# Patient Record
Sex: Male | Born: 1950 | Race: White | Hispanic: No | Marital: Married | Smoking: Never smoker
Health system: Southern US, Community
[De-identification: ages and names within clinical notes are randomized; demographics above are authoritative.]

## PROBLEM LIST (undated history)

## (undated) DIAGNOSIS — K635 Polyp of colon: Secondary | ICD-10-CM

## (undated) DIAGNOSIS — I10 Essential (primary) hypertension: Secondary | ICD-10-CM

## (undated) DIAGNOSIS — I219 Acute myocardial infarction, unspecified: Secondary | ICD-10-CM

## (undated) DIAGNOSIS — K219 Gastro-esophageal reflux disease without esophagitis: Secondary | ICD-10-CM

## (undated) HISTORY — PX: CORONARY ANGIOPLASTY WITH STENT PLACEMENT: SHX49

## (undated) HISTORY — DX: Essential (primary) hypertension: I10

## (undated) HISTORY — DX: Acute myocardial infarction, unspecified: I21.9

## (undated) HISTORY — DX: Gastro-esophageal reflux disease without esophagitis: K21.9

## (undated) HISTORY — DX: Polyp of colon: K63.5

---

## 2007-01-14 ENCOUNTER — Ambulatory Visit: Payer: Self-pay | Admitting: Unknown Physician Specialty

## 2008-07-28 ENCOUNTER — Emergency Department: Payer: Self-pay | Admitting: Internal Medicine

## 2010-11-21 DIAGNOSIS — Z860100 Personal history of colon polyps, unspecified: Secondary | ICD-10-CM | POA: Insufficient documentation

## 2010-11-21 DIAGNOSIS — K639 Disease of intestine, unspecified: Secondary | ICD-10-CM | POA: Insufficient documentation

## 2011-02-07 DIAGNOSIS — Z9889 Other specified postprocedural states: Secondary | ICD-10-CM | POA: Insufficient documentation

## 2011-02-07 HISTORY — PX: COLONOSCOPY: SHX174

## 2011-09-02 LAB — COMPREHENSIVE METABOLIC PANEL
Alkaline Phosphatase: 67 U/L (ref 50–136)
Anion Gap: 10 (ref 7–16)
BUN: 26 mg/dL — ABNORMAL HIGH (ref 7–18)
Bilirubin,Total: 0.4 mg/dL (ref 0.2–1.0)
Chloride: 106 mmol/L (ref 98–107)
EGFR (African American): >60
EGFR (Non-African Amer.): 57 — ABNORMAL LOW
Glucose: 145 mg/dL — ABNORMAL HIGH (ref 65–99)
SGOT(AST): 39 U/L — ABNORMAL HIGH (ref 15–37)
SGPT (ALT): 46 U/L
Total Protein: 7 g/dL (ref 6.4–8.2)

## 2011-09-02 LAB — CBC
HCT: 46.8 % (ref 40.0–52.0)
MCH: 32.1 pg (ref 26.0–34.0)
MCHC: 34.6 g/dL (ref 32.0–36.0)
RBC: 5.04 10*6/uL (ref 4.40–5.90)
RDW: 13.3 % (ref 11.5–14.5)

## 2011-09-02 LAB — TROPONIN I: Troponin-I: <0.02 ng/mL

## 2011-09-03 ENCOUNTER — Inpatient Hospital Stay: Payer: Self-pay | Admitting: Internal Medicine

## 2011-09-03 LAB — BASIC METABOLIC PANEL
BUN: 20 mg/dL — ABNORMAL HIGH (ref 7–18)
Chloride: 107 mmol/L (ref 98–107)
Creatinine: 1.16 mg/dL (ref 0.60–1.30)
EGFR (Non-African Amer.): >60
Potassium: 4.2 mmol/L (ref 3.5–5.1)
Sodium: 145 mmol/L (ref 136–145)

## 2011-09-03 LAB — LIPID PANEL
HDL Cholesterol: 47 mg/dL (ref 40–60)
Ldl Cholesterol, Calc: 98 mg/dL (ref 0–100)
VLDL Cholesterol, Calc: 19 mg/dL (ref 5–40)

## 2011-09-03 LAB — CBC WITH DIFFERENTIAL/PLATELET
Basophil #: 0 10*3/uL (ref 0.0–0.1)
HCT: 43.6 % (ref 40.0–52.0)
Lymphocyte #: 1.3 10*3/uL (ref 1.0–3.6)
MCHC: 34.7 g/dL (ref 32.0–36.0)
MCV: 92 fL (ref 80–100)
Monocyte #: 0.8 x10 3/mm (ref 0.2–1.0)
Monocyte %: 8.3 %
Neutrophil #: 7.3 10*3/uL — ABNORMAL HIGH (ref 1.4–6.5)
Platelet: 193 10*3/uL (ref 150–440)
RDW: 13.3 % (ref 11.5–14.5)
WBC: 9.5 10*3/uL (ref 3.8–10.6)

## 2011-09-03 LAB — CK TOTAL AND CKMB (NOT AT ARMC)
CK, Total: 216 U/L (ref 35–232)
CK-MB: 7.3 ng/mL — ABNORMAL HIGH (ref 0.5–3.6)
CK-MB: 8.3 ng/mL — ABNORMAL HIGH (ref 0.5–3.6)

## 2011-09-03 LAB — TROPONIN I: Troponin-I: 1.3 ng/mL — ABNORMAL HIGH

## 2012-11-11 ENCOUNTER — Other Ambulatory Visit: Payer: Self-pay | Admitting: *Deleted

## 2012-11-11 NOTE — Telephone Encounter (Signed)
PRIOR AUTH IS NEEDED FOR PRESCRIPTION VOLTAREN GEL

## 2012-12-13 NOTE — Telephone Encounter (Signed)
VOLTAREN GEL AUTHORIZED

## 2013-03-05 DIAGNOSIS — I219 Acute myocardial infarction, unspecified: Secondary | ICD-10-CM

## 2013-03-05 HISTORY — DX: Acute myocardial infarction, unspecified: I21.9

## 2013-03-07 DIAGNOSIS — K219 Gastro-esophageal reflux disease without esophagitis: Secondary | ICD-10-CM | POA: Insufficient documentation

## 2013-03-07 DIAGNOSIS — I2510 Atherosclerotic heart disease of native coronary artery without angina pectoris: Secondary | ICD-10-CM | POA: Insufficient documentation

## 2013-03-07 DIAGNOSIS — I213 ST elevation (STEMI) myocardial infarction of unspecified site: Secondary | ICD-10-CM | POA: Insufficient documentation

## 2013-03-07 DIAGNOSIS — E119 Type 2 diabetes mellitus without complications: Secondary | ICD-10-CM | POA: Insufficient documentation

## 2013-03-14 DIAGNOSIS — I255 Ischemic cardiomyopathy: Secondary | ICD-10-CM | POA: Insufficient documentation

## 2013-03-14 DIAGNOSIS — E7800 Pure hypercholesterolemia, unspecified: Secondary | ICD-10-CM | POA: Insufficient documentation

## 2013-03-18 ENCOUNTER — Encounter: Payer: Self-pay | Admitting: General Practice

## 2013-04-06 ENCOUNTER — Encounter: Payer: Self-pay | Admitting: General Practice

## 2013-04-15 ENCOUNTER — Ambulatory Visit: Payer: Self-pay | Admitting: General Practice

## 2013-05-07 ENCOUNTER — Encounter: Payer: Self-pay | Admitting: General Practice

## 2013-05-14 ENCOUNTER — Ambulatory Visit: Payer: Self-pay | Admitting: General Practice

## 2013-05-15 DIAGNOSIS — I10 Essential (primary) hypertension: Secondary | ICD-10-CM | POA: Insufficient documentation

## 2013-05-21 ENCOUNTER — Ambulatory Visit: Payer: Self-pay | Admitting: General Practice

## 2013-05-21 ENCOUNTER — Encounter: Payer: Self-pay | Admitting: Podiatry

## 2013-05-21 ENCOUNTER — Ambulatory Visit (INDEPENDENT_AMBULATORY_CARE_PROVIDER_SITE_OTHER): Payer: BC Managed Care – PPO | Admitting: Podiatry

## 2013-05-21 ENCOUNTER — Ambulatory Visit: Payer: Self-pay | Admitting: Podiatry

## 2013-05-21 VITALS — BP 116/79 | HR 67 | Resp 16 | Ht 75.0 in | Wt 208.0 lb

## 2013-05-21 DIAGNOSIS — M722 Plantar fascial fibromatosis: Secondary | ICD-10-CM

## 2013-05-21 NOTE — Progress Notes (Signed)
   Presents today for followup of his plantar fasciitis. He states he has recently had an MI and is currently in cardiac rehabilitation after coronary artery stent placement. He states with the aggressive exercise regimen his feet it started to hurt again.  Objective: Pulses are palpable bilateral. Minimal tenderness on pain heels bilateral.  Assessment: Plantar fasciitis bilateral.  Plan: Scan for  Biotics.

## 2013-06-04 ENCOUNTER — Encounter: Payer: Self-pay | Admitting: *Deleted

## 2013-06-04 NOTE — Progress Notes (Signed)
SENT PT POST CARD LETTING HIM KNOW ORTHOTICS ARE HERE. 

## 2013-06-06 ENCOUNTER — Ambulatory Visit: Payer: Self-pay | Admitting: General Practice

## 2013-06-09 ENCOUNTER — Encounter: Payer: Self-pay | Admitting: Podiatry

## 2013-06-10 ENCOUNTER — Other Ambulatory Visit: Payer: Self-pay | Admitting: *Deleted

## 2013-06-10 MED ORDER — DICLOFENAC SODIUM 1 % TD GEL: 4.0000 g | Freq: Four times a day (QID) | TRANSDERMAL | Status: DC

## 2013-06-10 NOTE — Telephone Encounter (Signed)
PT CAME INTO OFFICE REQUESTING VOLTAREN GEL 1%.

## 2013-07-16 ENCOUNTER — Ambulatory Visit (INDEPENDENT_AMBULATORY_CARE_PROVIDER_SITE_OTHER): Payer: BC Managed Care – PPO | Admitting: Podiatry

## 2013-07-16 VITALS — BP 132/105 | HR 108 | Resp 16

## 2013-07-16 DIAGNOSIS — M722 Plantar fascial fibromatosis: Secondary | ICD-10-CM

## 2013-07-16 NOTE — Progress Notes (Signed)
He presents today for followup of his orthotics. He states they don't fit in his shoes.  Objective: Evaluation of the orthotics demonstrate that they appear to be too wide and with an excessively high arch.  Assessment: Painful orthotics.  Plan: Have a new pair orthotics built for him.

## 2013-07-25 ENCOUNTER — Encounter: Payer: Self-pay | Admitting: *Deleted

## 2013-07-25 NOTE — Progress Notes (Signed)
Sent pt post card letting him know orthotics are here. 

## 2013-07-30 ENCOUNTER — Encounter: Payer: Self-pay | Admitting: Podiatry

## 2013-12-10 DIAGNOSIS — M722 Plantar fascial fibromatosis: Secondary | ICD-10-CM

## 2013-12-15 ENCOUNTER — Ambulatory Visit (INDEPENDENT_AMBULATORY_CARE_PROVIDER_SITE_OTHER): Payer: BC Managed Care – PPO | Admitting: Podiatry

## 2013-12-15 VITALS — BP 121/82 | HR 78 | Resp 16

## 2013-12-15 DIAGNOSIS — M722 Plantar fascial fibromatosis: Secondary | ICD-10-CM

## 2013-12-15 MED ORDER — DICLOFENAC SODIUM 1 % TD GEL: 4.0000 g | Freq: Four times a day (QID) | TRANSDERMAL | Status: DC

## 2013-12-15 NOTE — Progress Notes (Signed)
He presents today stating that his right heel is hurting worse than his left. He states this is more than likely because of his physical therapy and cardiac rehabilitation.  Objective: Vital signs are stable he is alert and oriented 3 pulses are strongly palpable bilateral. No calf pain. He has pain on palpation medial calcaneal tubercle radiating into the medial longitudinal arch of the right foot less degree on the left foot.  Assessment: Plantar fasciitis right foot.  Plan: Discussed etiology pathology conservative versus surgical therapies. Injected the right heel today with Kenalog and local anesthetic. Dispensed to plantar fascial braces. And provided him with a plantar fascial strapping right foot. He will be back to pick up his refurbished orthotics in the next few days.

## 2014-03-05 ENCOUNTER — Encounter: Payer: Self-pay | Admitting: *Deleted

## 2014-03-19 ENCOUNTER — Ambulatory Visit (INDEPENDENT_AMBULATORY_CARE_PROVIDER_SITE_OTHER): Payer: BLUE CROSS/BLUE SHIELD | Admitting: General Surgery

## 2014-03-19 ENCOUNTER — Encounter: Payer: Self-pay | Admitting: General Surgery

## 2014-03-19 VITALS — BP 120/80 | HR 76 | Resp 12 | Ht 75.0 in | Wt 201.0 lb

## 2014-03-19 DIAGNOSIS — K4090 Unilateral inguinal hernia, without obstruction or gangrene, not specified as recurrent: Secondary | ICD-10-CM

## 2014-03-19 NOTE — Patient Instructions (Addendum)

## 2014-03-19 NOTE — Progress Notes (Addendum)
Patient ID: Gabriel Butler, male   DOB: 08/22/1950, 64 y.o.   MRN: [id number]  Chief Complaint  Patient presents with  . Other    right inguinal hernia    HPI Gabriel Butler is a 64 y.o. male here today for a evaluation of a right inguinal hernia. Patient states the noticed it three weeks ago. No pain with activity.The area pops in and out.   The patient has had no recurrent coronary symptoms since stent placement on 03/05/2013 at [facility]. HPI  Past Medical History  Diagnosis Date  . Hypertension   . GERD (gastroesophageal reflux disease)   . Heart attack 03/05/2013    Coronary stent placement.    Past Surgical History  Procedure Laterality Date  . Coronary angioplasty with stent placement    . Colonoscopy  2013    Family History  Problem Relation Age of Onset  . Lung cancer Father   . Skin cancer Brother     Social History History  Substance Use Topics  . Smoking status: Never Smoker   . Smokeless tobacco: Never Used  . Alcohol Use: 0.0 oz/week    0 Standard drinks or equivalent per week    No Known Allergies  Current Outpatient Prescriptions  Medication Sig Dispense Refill  . ALPRAZolam (XANAX) 0.25 MG tablet     . atorvastatin (LIPITOR) 40 MG tablet     . azelastine (OPTIVAR) 0.05 % ophthalmic solution     . BRILINTA 90 MG TABS tablet     . CRESTOR 40 MG tablet     . diclofenac sodium (VOLTAREN) 1 % GEL Apply 4 g topically 4 (four) times daily. 1 Tube 11  . fexofenadine (ALLEGRA) 180 MG tablet Take by mouth.    Marland Kitchen lisinopril (PRINIVIL,ZESTRIL) 10 MG tablet     . metoprolol succinate (TOPROL-XL) 50 MG 24 hr tablet Take 1 tablet by mouth 2 (two) times daily.  0  . montelukast (SINGULAIR) 10 MG tablet     . pantoprazole (PROTONIX) 40 MG tablet   12  . sertraline (ZOLOFT) 50 MG tablet Take 50 mg by mouth daily.    Marland Kitchen spironolactone (ALDACTONE) 25 MG tablet Take 25 mg by mouth daily.    . traZODone (DESYREL) 50 MG tablet Take 50 mg by mouth 2 (two) times  daily.     No current facility-administered medications for this visit.    Review of Systems Review of Systems  Constitutional: Negative.   Respiratory: Negative.   Cardiovascular: Negative.     Blood pressure 120/80, pulse 76, resp. rate 12, height 6\' 3"  (1.905 m), weight 201 lb (91.173 kg).  Physical Exam Physical Exam  Constitutional: He is oriented to person, place, and time. He appears well-developed and well-nourished.  Eyes: Conjunctivae are normal. No scleral icterus.  Neck: Neck supple.  Cardiovascular: Normal rate, regular rhythm and normal heart sounds.   Pulmonary/Chest: Effort normal and breath sounds normal.  Abdominal: Soft. Normal appearance and bowel sounds are normal. There is no hepatomegaly. There is no tenderness. A hernia is present. Hernia confirmed positive in the right inguinal area. Hernia confirmed negative in the left inguinal area.  Lymphadenopathy:    He has no cervical adenopathy.  Neurological: He is alert and oriented to person, place, and time.  Skin: Skin is warm and dry.    Data Reviewed PCP notes from Jabier Mutton, M.D.  Laboratory studies dated 03/13/2014 showed a normal creatinine 1.0 with an estimated GFR of 79, mildly elevated blood  sugar 127, normal electrolytes and liver function studies. TSH of 2.2, hemoglobin 15.9 with an MCV of 93, white blood cell count 5200 with normal differential.  Assessment    Symptomatic right inguinal hernia.    Plan    Discussed right inguinal hernia repair. Indication for mesh repair reviewed. The patient will be instructed to discontinue Brilinta one week prior to the procedure.     Patient's surgery has been scheduled for 03-26-14 at [facility].    Earline Mayotte 03/20/2014, 9:19 AM

## 2014-03-20 ENCOUNTER — Other Ambulatory Visit: Payer: Self-pay | Admitting: General Surgery

## 2014-03-20 ENCOUNTER — Encounter: Payer: Self-pay | Admitting: General Surgery

## 2014-03-20 DIAGNOSIS — K4090 Unilateral inguinal hernia, without obstruction or gangrene, not specified as recurrent: Secondary | ICD-10-CM

## 2014-03-23 ENCOUNTER — Ambulatory Visit: Payer: Self-pay | Admitting: General Surgery

## 2014-03-26 ENCOUNTER — Ambulatory Visit: Payer: Self-pay | Admitting: General Surgery

## 2014-03-26 DIAGNOSIS — K4090 Unilateral inguinal hernia, without obstruction or gangrene, not specified as recurrent: Secondary | ICD-10-CM

## 2014-03-26 HISTORY — PX: HERNIA REPAIR: SHX51

## 2014-03-30 ENCOUNTER — Encounter: Payer: Self-pay | Admitting: General Surgery

## 2014-04-02 ENCOUNTER — Encounter: Payer: Self-pay | Admitting: General Surgery

## 2014-04-02 ENCOUNTER — Ambulatory Visit (INDEPENDENT_AMBULATORY_CARE_PROVIDER_SITE_OTHER): Payer: Self-pay | Admitting: General Surgery

## 2014-04-02 VITALS — BP 122/68 | HR 72 | Resp 12 | Ht 75.0 in | Wt 198.0 lb

## 2014-04-02 DIAGNOSIS — K4090 Unilateral inguinal hernia, without obstruction or gangrene, not specified as recurrent: Secondary | ICD-10-CM

## 2014-04-02 NOTE — Patient Instructions (Addendum)
Patient to return as needed..  The patient is aware to use a heating pad as needed for comfort. Proper lifting techniques reviewed.

## 2014-04-02 NOTE — Progress Notes (Signed)
Patient ID: Gabriel Butler, male   DOB: 10/26/1950, 64 y.o.   MRN: [id number]  Chief Complaint  Patient presents with  . Routine Post Op    right inguinal hernia     HPI Gabriel Butler is a 64 y.o. male here today for his post op right inguinal hernia repair done on 03/26/14. Patient state he is doing well.  HPI  Past Medical History  Diagnosis Date  . Hypertension   . GERD (gastroesophageal reflux disease)   . Heart attack 03/05/2013    Coronary stent placement.    Past Surgical History  Procedure Laterality Date  . Coronary angioplasty with stent placement    . Colonoscopy  2013  . Hernia repair Right 03/26/14    Direct and indirect hernia noted, large ultra Pro mesh utilized.    Family History  Problem Relation Age of Onset  . Lung cancer Father   . Skin cancer Brother     Social History History  Substance Use Topics  . Smoking status: Never Smoker   . Smokeless tobacco: Never Used  . Alcohol Use: 0.0 oz/week    0 Standard drinks or equivalent per week    No Known Allergies  Current Outpatient Prescriptions  Medication Sig Dispense Refill  . ALPRAZolam (XANAX) 0.25 MG tablet     . atorvastatin (LIPITOR) 40 MG tablet     . azelastine (OPTIVAR) 0.05 % ophthalmic solution     . BRILINTA 90 MG TABS tablet     . CRESTOR 40 MG tablet     . diclofenac sodium (VOLTAREN) 1 % GEL Apply 4 g topically 4 (four) times daily. 1 Tube 11  . fexofenadine (ALLEGRA) 180 MG tablet Take by mouth.    Marland Kitchen lisinopril (PRINIVIL,ZESTRIL) 10 MG tablet     . metoprolol succinate (TOPROL-XL) 50 MG 24 hr tablet Take 1 tablet by mouth 2 (two) times daily.  0  . montelukast (SINGULAIR) 10 MG tablet     . pantoprazole (PROTONIX) 40 MG tablet   12  . sertraline (ZOLOFT) 50 MG tablet Take 50 mg by mouth daily.    Marland Kitchen spironolactone (ALDACTONE) 25 MG tablet Take 25 mg by mouth daily.    . traZODone (DESYREL) 50 MG tablet Take 50 mg by mouth 2 (two) times daily.     No current  facility-administered medications for this visit.    Review of Systems Review of Systems  Constitutional: Negative.   Respiratory: Negative.   Cardiovascular: Negative.     Blood pressure 122/68, pulse 72, resp. rate 12, height 6\' 3"  (1.905 m), weight 198 lb (89.812 kg).  Physical Exam Physical Exam  Constitutional: He is oriented to person, place, and time. He appears well-developed and well-nourished.  Abdominal:    Incision looks clean and healing well.   Neurological: He is alert and oriented to person, place, and time.  Skin: Skin is warm and dry.      Assessment    Doing well status post umbilical hernia repair.    Plan   The patient is aware to use a heating pad as needed for comfort.Proper lifting techniques reviewed. Patient to return as needed.      PCP:  Leigh Aurora 04/03/2014, 4:54 PM

## 2014-04-03 ENCOUNTER — Encounter: Payer: Self-pay | Admitting: General Surgery

## 2014-05-26 NOTE — Consult Note (Signed)
Brief Consult Note: Diagnosis: [place] nqmi.   Recommend to proceed with surgery or procedure.   Recommend further assessment or treatment.   Orders entered.   Discussed with Attending MD.   Comments: Case discussed with Prime Doc . We will procede with cardiac cath Monday Continue ROMI Continue tele lovenox subcutaneous Hydrate IV fluids.  Electronic Signatures: Dorothyann Peng D (MD)  (Signed 28-Jul-13 09:30)  Authored: Brief Consult Note   Last Updated: 28-Jul-13 09:30 by Dorothyann Peng D (MD)

## 2014-05-26 NOTE — Discharge Summary (Signed)
PATIENT NAME:  Gabriel Butler, Gabriel Butler MR#:  [id number] DATE OF BIRTH:  Dec 25, 1950  DATE OF ADMISSION:  09/03/2011 DATE OF DISCHARGE:  09/04/2011  DISCHARGE DIAGNOSES:  1. NSTEMI. 2. Coronary artery disease.  3. Hypertension.  4. Mild renal insufficiency.  5. Dizziness.   CONSULT: Dr. Juliann Pares of Cardiology    PROCEDURE: Cardiac catheterization which showed mild coronary artery disease with no stent placed.   IMAGING STUDIES DONE: Chest x-ray showed no acute abnormalities.   ADMITTING HISTORY AND PHYSICAL: Please see detailed history and physical dictated on 09/02/2011. In brief, this is a 64 year old male patient with history of hypertension, hyperglycemia, and plantar fasciitis who presented to the Emergency Room complaining of dizziness and blurred vision. The patient subsequently developed chest pain with radiation to the left shoulder and was admitted.   HOSPITAL COURSE: The patient had been admitted on telemetry floor. He had elevated troponin of up to 1.2 after which Cardiology was consulted for possible cardiac cath. The patient had a cardiac cath which showed no significant stenosis. It did show some mild coronary artery disease with no fixable occlusion and no stents were placed. The patient is being discharged home on aspirin, statin, beta-blocker, ACE inhibitor, and long-acting nitrate after discussing with Dr. Juliann Pares.   On the day of discharge the patient's blood pressure is in the normal range. He does not have any further chest pain. Cardiac examination is normal and he is being discharged home in a stable condition.    DISCHARGE MEDICATIONS:  1. Aspirin 81 mg oral once a day.  2. Simvastatin 20 mg oral once a day.  3. Imdur 30 mg oral once a day.  4. Lisinopril 5 mg oral once a day.  5. Metoprolol tartrate 25 mg oral 2 times a day.   DISCHARGE INSTRUCTIONS:  1. Follow-up with primary care physician in a week.  2. Follow-up with Dr. Juliann Pares in 1 to 2 weeks.  3. Take  medications as prescribed.  4. The patient will be on a cardiac diet with activity as tolerated.   This plan was discussed with the patient who verbalized understanding and is okay with the plan.   TIME SPENT: Time spent today on this discharge dictation along with coordinating care and counseling of the patient was 35 minutes.   ____________________________ Molinda Bailiff Alazne Quant, MD srs:drc D: 09/04/2011 16:52:33 ET T: 09/05/2011 14:34:14 ET JOB#: [id number]  cc: Wardell Heath R. Eriyonna Matsushita, MD, <Dictator> Quita Skye. Dorothey Baseman, MD Orie Fisherman MD ELECTRONICALLY SIGNED 09/06/2011 13:57

## 2014-05-26 NOTE — H&P (Signed)
PATIENT NAME:  Gabriel Butler, Gabriel Butler MR#:  [id number] DATE OF BIRTH:  February 07, 1951  DATE OF ADMISSION:  09/02/2011  REFERRING PHYSICIAN: Dr. Darnelle Catalan   FAMILY PHYSICIAN: Dr. Jabier Mutton    REASON FOR ADMISSION: Chest pain with near syncope.   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 He subsequently developed chest pain radiating to the left shoulder. His pain was described as pressure and was rated a 3 out of 10. His symptoms lasted approximately 35 to 40 minutes and resolved spontaneously. He denies any previous cardiac history. At this time the patient is asymptomatic except for some lightheadedness. In the Emergency Room, the patient's troponin went from less than 0.02 to 0.05. His EKG is normal. He is now admitted for further evaluation.   PAST MEDICAL HISTORY:  1. Benign hypertension.  2. Hyperglycemia.  3. Plantar fasciitis.   MEDICATIONS:  1. Lisinopril 5 mg p.o. daily.  2. Mobic 7.5 mg p.o. daily.  3. Aspirin 81 mg p.o. daily.   ALLERGIES: No known drug allergies.   SOCIAL HISTORY: Negative for alcohol or tobacco abuse.   FAMILY HISTORY: Negative for coronary artery disease. Positive for hypertension. Negative for colon or prostate cancer.   REVIEW OF SYSTEMS: CONSTITUTIONAL: No fever or change in weight. EYES: Blurred vision as per history of present illness. No glaucoma. ENT: No tinnitus or hearing loss. No nasal discharge or bleeding. No difficulty swallowing. RESPIRATORY: No cough or wheezing. Denies hemoptysis. No painful respiration. CARDIOVASCULAR: No orthopnea or palpitations. No actual syncope. GI: Some nausea but no vomiting or diarrhea. No abdominal pain. No change in bowel habits. GU: No dysuria, hematuria, or incontinence.  ENDOCRINE: No polyuria or polydipsia. No heat or cold intolerance. HEMATOLOGIC: The patient denies anemia, easy bruising, or bleeding. LYMPHATIC: No swollen glands. MUSCULOSKELETAL: The patient denies pain in his neck, back, shoulders, knees, or hips. No gout. NEUROLOGIC: No numbness although he has generalized weakness. Denies migraines, stroke, or seizures. PSYCH: The patient denies anxiety, insomnia, or depression.   PHYSICAL EXAMINATION:   GENERAL: The patient is in no acute distress.   VITAL SIGNS: Vital signs are remarkable for a blood pressure of 149/92 with a heart rate of 80 and respiratory rate of 18, temperature of 98.1.   HEENT: Normocephalic, atraumatic. Pupils equally round and reactive to light and accommodation. Extraocular movements are intact. Sclerae are nonicteric. Conjunctivae are clear. Oropharynx is clear.   NECK: Supple without JVD or bruits. No adenopathy or thyromegaly is noted.   LUNGS: Clear to auscultation and percussion without wheezes, rales, or rhonchi. No dullness.   CARDIAC: Regular rate and rhythm with normal S1, S2. There is a 2/6 systolic ejection murmur noted. No rubs or gallops are present.   ABDOMEN: Soft, nontender with normoactive bowel sounds. No organomegaly or masses were appreciated. No hernias or bruits were noted.   EXTREMITIES: Without clubbing, cyanosis, or edema. Pulses were 2+ bilaterally.   SKIN: Warm and dry without rash or lesions.   NEUROLOGIC: Cranial nerves II through XII grossly intact. Deep tendon reflexes were symmetric. Motor and sensory exams nonfocal.   PSYCHIATRIC: Alert and oriented to person, place, and time. He was cooperative and used good judgment.   LABORATORY DATA: Total CK was 251 with an MB of 3.9. Troponin was initially less than 0.02 and  then increased to 0.05 in the Emergency Room. White count was 7.2 with a hemoglobin of 16.2. Glucose 145 with a BUN of 26 and a creatinine of 1.34 with a GFR of 57. Potassium was  4.0 with a sodium of 142.   Chest x-ray was unremarkable.   EKG revealed sinus rhythm with no acute ischemic changes.   ASSESSMENT:  1. Chest pain worrisome for new onset angina.  2. Dehydration.  3. Mild renal insufficiency.  4. Benign hypertension.  5. Hyperglycemia.  6. Near syncope.   PLAN:  1. The patient will be observed on telemetry with Lovenox and aspirin.  2. Will begin empiric topical nitrates and beta-blocker therapy.  3. Will follow serial cardiac enzymes.  4. Will begin IV fluids for hydration.  5. Follow-up his renal function in the morning.  6. Will obtain an echocardiogram because of his cardiac murmur.  7. Will also consult Cardiology because of the patient's chest pain syndrome and cardiac murmur.  8. Will follow his sugars with Accu-Cheks q.a.c. and at bedtime and add sliding scale insulin as needed.  9. Further treatment and evaluation will depend upon the patient's progress.        TOTAL TIME SPENT ON THIS PATIENT: 45 minutes.   ____________________________ Duane Lope Judithann Sheen, MD jds:drc D: 09/02/2011 20:34:23 ET T: 09/03/2011 08:18:45 ET JOB#: [id number]  cc: Duane Lope. Judithann Sheen, MD, <Dictator> Quita Skye. Dorothey Baseman, MD JEFFREY Rodena Medin MD ELECTRONICALLY SIGNED 09/03/2011 13:26

## 2014-06-07 NOTE — Op Note (Signed)
PATIENT NAME:  Gabriel Butler, Gabriel Butler MR#:  [id number] DATE OF BIRTH:  09-18-1950  DATE OF PROCEDURE:  03/26/2014  PREOPERATIVE DIAGNOSIS: Symptomatic right inguinal hernia.   POSTOPERATIVE DIAGNOSIS: Symptomatic right inguinal hernia.  OPERATIVE PROCEDURE: Right inguinal hernia repair with Ultrapro mesh.   SURGEON: Donnalee Curry, MD   ANESTHESIA: General endotracheal, Marcaine 0.5% with 1:200,000 units of epinephrine, 30 mL local infiltration, Toradol 30 mg.   ESTIMATED BLOOD LOSS: Less than 5 mL.   CLINICAL NOTE: This male has developed a symptomatic umbilical hernia. He was admitted for elective repair. He received Kefzol prior to the procedure. Hair was removed from the surgical field with clippers prior to presentation to the operating theater.   OPERATIVE NOTE: With the patient under adequate general anesthesia, the area was prepped with ChloraPrep and draped. Marcaine was infiltrated for a field block anesthesia for postoperative analgesia. A 5 cm skin line incision was made and carried down through the skin and subcutaneous tissue with hemostasis achieved by electrocautery and 3-0 Vicryl ties. The external oblique was opened in the direction of its fibers. The patient was found to have a sizable indirect sac as well as a medial bulge suggestive of a pantaloon hernia. The hernia sac was dissected free of adjacent tissues into the preperitoneal space. The area above the inferior epigastric vessel was dissected free to expose the medial aspect of the inguinal canal and a large Ultrapro mesh was smoothed into position. The external component was laid along the floor of the inguinal canal. The lateral slit was closed with interrupted 0 Surgilon sutures. The mesh was fixed to the pubic tubercle with 0 Surgilon and then along the inguinal ligament with interrupted 0 Surgilon sutures. The medial and superior borders were anchored to the transverse abdominis aponeurosis. The inferior epigastric vessels were  preserved. The ilioinguinal and iliohypogastric nerves were preserved. Toradol was placed into the wound. The external oblique was closed with running 2-0 Vicryl. Scarpa fascia was closed with a running 3-0 Vicryl, and the skin closed with a running 4-0 Vicryl subcuticular suture. Benzoin, Steri-Strips, Telfa and Tegaderm dressings were then applied.   The patient tolerated the procedure well and was taken to the recovery room in stable condition.    ____________________________ Earline Mayotte, MD jwb:TM D: 03/29/2014 20:12:32 ET T: 03/29/2014 22:37:16 ET JOB#: [id number]  cc: Earline Mayotte, MD, <Dictator> Quita Skye. Dorothey Baseman, MD Drexel Ivey Brion Aliment MD ELECTRONICALLY SIGNED 03/30/2014 7:57

## 2014-09-21 ENCOUNTER — Other Ambulatory Visit: Payer: Self-pay | Admitting: Physician Assistant

## 2014-10-21 ENCOUNTER — Other Ambulatory Visit: Payer: Self-pay | Admitting: Physician Assistant

## 2014-10-21 DIAGNOSIS — I2510 Atherosclerotic heart disease of native coronary artery without angina pectoris: Secondary | ICD-10-CM

## 2014-10-21 DIAGNOSIS — Z9109 Other allergy status, other than to drugs and biological substances: Secondary | ICD-10-CM

## 2015-02-13 ENCOUNTER — Other Ambulatory Visit: Payer: Self-pay | Admitting: Physician Assistant

## 2015-03-15 ENCOUNTER — Other Ambulatory Visit: Payer: Self-pay | Admitting: Physician Assistant

## 2015-03-26 ENCOUNTER — Other Ambulatory Visit: Payer: Self-pay | Admitting: Podiatry

## 2015-07-12 ENCOUNTER — Telehealth: Payer: Self-pay | Admitting: *Deleted

## 2015-07-12 MED ORDER — DICLOFENAC SODIUM 1 % TD GEL: 4.0000 g | Freq: Four times a day (QID) | TRANSDERMAL | Status: AC

## 2015-07-12 NOTE — Telephone Encounter (Signed)
Refill request for Voltaren gel 1%, refill without additional, pt needs an appt.

## 2015-09-24 ENCOUNTER — Other Ambulatory Visit: Payer: Self-pay | Admitting: Physician Assistant

## 2015-09-24 DIAGNOSIS — I2510 Atherosclerotic heart disease of native coronary artery without angina pectoris: Secondary | ICD-10-CM

## 2015-09-24 NOTE — Telephone Encounter (Signed)
You have been filling pt's prescriptions. Has not been seen since Epic, and does not have OV scheduled. Allene Dillon, CMA

## 2015-09-29 ENCOUNTER — Other Ambulatory Visit: Payer: Self-pay | Admitting: Physician Assistant

## 2015-10-20 ENCOUNTER — Other Ambulatory Visit: Payer: Self-pay | Admitting: Physician Assistant

## 2015-10-20 DIAGNOSIS — Z9109 Other allergy status, other than to drugs and biological substances: Secondary | ICD-10-CM

## 2015-11-22 ENCOUNTER — Other Ambulatory Visit: Payer: Self-pay | Admitting: Physician Assistant

## 2015-11-22 DIAGNOSIS — Z9109 Other allergy status, other than to drugs and biological substances: Secondary | ICD-10-CM

## 2015-12-24 ENCOUNTER — Telehealth: Payer: Self-pay | Admitting: *Deleted

## 2015-12-24 NOTE — Telephone Encounter (Signed)
Received fax request for Diclofenac gel. Pt was instructed to make an appt 07/12/2015 when one refill was given to get to appt date. Return fax refused refill.

## 2016-07-12 ENCOUNTER — Other Ambulatory Visit: Payer: Self-pay | Admitting: Physician Assistant

## 2016-07-12 DIAGNOSIS — Z9109 Other allergy status, other than to drugs and biological substances: Secondary | ICD-10-CM

## 2016-07-17 ENCOUNTER — Other Ambulatory Visit: Payer: Self-pay | Admitting: Physician Assistant

## 2016-07-17 DIAGNOSIS — Z9109 Other allergy status, other than to drugs and biological substances: Secondary | ICD-10-CM

## 2017-06-26 ENCOUNTER — Ambulatory Visit: Payer: BLUE CROSS/BLUE SHIELD | Attending: Family Medicine | Admitting: Physical Therapy

## 2017-06-26 ENCOUNTER — Encounter: Payer: Self-pay | Admitting: Physical Therapy

## 2017-06-26 DIAGNOSIS — M79605 Pain in left leg: Secondary | ICD-10-CM | POA: Insufficient documentation

## 2017-06-26 DIAGNOSIS — M25562 Pain in left knee: Secondary | ICD-10-CM | POA: Insufficient documentation

## 2017-06-26 DIAGNOSIS — M25552 Pain in left hip: Secondary | ICD-10-CM | POA: Insufficient documentation

## 2017-06-26 NOTE — Therapy (Signed)
[facility] [facility] [facility] PHYS[facility] SPORTS MEDICINE [address], [place], [address] Phone: [phone]   Fax:  [phone]  Physical Therapy Evaluation  Patient Details  Name: Gabriel Butler MRN: [id number] Date of Birth: [birth year] Referring Provider: [name] MD   Encounter Date: 06/26/2017  PT End of Session - 06/26/17 1551    Visit Number  1    Number of Visits  17    Date for PT Re-Evaluation  08/21/17    PT Start Time  0245    PT Stop Time  0345    PT Time Calculation (min)  60 min    Activity Tolerance  Patient tolerated treatment well    Behavior During Therapy  [facility] for tasks assessed/performed       Past Medical History:  Diagnosis Date  . GERD (gastroesophageal reflux disease)   . Heart attack (HCC) 03/05/2013   Coronary stent placement.  . Hypertension       There were no vitals filed for this visit.   Subjective Assessment - 06/26/17 1454    Pertinent History  Pt is a 67 year old male referred for L IT band syndrome. Patient reports pain in the L hip beginning 2 months ago that has progressed down the lateral aspect of the L leg into the front of the shin. Patient reports pain with descending stairs, prolonged walking, and prolonged driving/traveling (daughter lives in [place]). Patient reports he started meloxicam a couple days ago and that this has helping his pain. Worst pain in past week: 7/10 best: 2/10    Limitations  Sitting;Lifting;House hold activities;Standing;Walking    How long can you sit comfortably?     How long can you stand comfortably?     How long can you walk comfortably?     Diagnostic tests  None    Patient Stated Goals  Travel without pain     Currently in Pain?  Yes    Pain Score  2     Pain Location  Hip    Pain Orientation  Lateral;Left    Pain Descriptors / Indicators  Sharp;Shooting    Pain Type  Acute pain    Pain Radiating Towards  down lateral aspect of L LE to front  of L shin    Pain Onset  More than a month ago    Pain Frequency  Constant    Aggravating Factors   descending stairs, prolonged sitting, prolonged walking    Pain Relieving Factors  pain medication, heating pad    Effect of Pain on Daily Activities  Unable to travel to see his daughter, unable to walk with his dog without pain     Multiple Pain Sites  No       ROM All lumbar motions wnl except flexion with "pulling" at bilat hamstrings R hip motions wnl EXCEPT IR 50% limited  Strength Hip flex: 4+/bilat Hip abd 4/5 bilat Hip Ext in prone: 4-/5 bilat Hip IR: 4+/5 bilat Hip ER: L 4-/5  R 4+/5 Knee flex: 5/5 bilat Knee ext: 5/5 bilat   Special Tests/ Other Reflexes 2+ patella (+) Elys bilat hip flexor tightness Pain relief with CPA to L3-L5 (+) 90/90 bilat (+) FABER bilat (-) Obers bilat patient reports "some pulling" with L obers (+) Thomas test on L  (-) Scour bilat Palpation of L LE: TTP with trigger point at lateral glute max/min muscle belly, TTP with trigger points at distal concordant sign at distal/lateral  biceps femoris muscle belly, TTP at ant tib Gait: No abnormalities   Ther-Ex -Seated piriformis stretch 4x  30sec holds (HEP) -Seated hamstring stretch 4x  30sec holds (HEP) -Supine thomas stretch 4x  30sec holds (HEP) -Education on PT role and there-ex role on pain and strengthening muscles surrounding the hip to increase stability and decrease pain. Education on utilization of heat to decrease muscle tightness        [facility] PT Assessment - 06/26/17 0001      Assessment   Medical Diagnosis  IT band syndrome    Referring Provider  [name] MD    Onset Date/Surgical Date  04/10/17    Hand Dominance  Right    Next MD Visit  Yes; unknown    Prior Therapy  Yes; back       Balance Screen   Has the patient fallen in the past 6 months  No    Has the patient had a decrease in activity level because of a fear of falling?   No    Is the patient reluctant to  leave their home because of a fear of falling?   No      Home Environment   Living Environment  Private residence    Living Arrangements  Spouse/significant other    Available Help at Discharge  Family    Type of Home  House    Home Access  Stairs to enter    Home Layout  Two level      Prior Function   Level of Independence  Independent    Vocation  Self employed    Vocation Requirements  -- Driving, computer desk    Leisure  -- Walk dog, golf, visit family, play tennis                Objective measurements completed on examination: See above findings.              PT Education - 06/26/17 1502    Education provided  Yes    Education Details  Patient was educated on diagnosis, anatomy and pathology involved, prognosis, role of PT, and was given an HEP, demonstrating exercise with proper form following verbal and tactile cues, and was given a paper hand out to continue exercise at home. Pt was educated on and agreed to plan of care.    Person(s) Educated  Patient    Methods  Explanation;Demonstration;Tactile cues;Verbal cues    Comprehension  Verbalized understanding;Returned demonstration;Verbal cues required;Tactile cues required       PT Short Term Goals - 06/26/17 1659      PT SHORT TERM GOAL #1   Title  Pt will be independent with HEP in order to improve strength and balance in order to decrease fall risk and improve function at home and work.    Time  4    Period  Weeks    Status  New        PT Long Term Goals - 06/26/17 1700      PT LONG TERM GOAL #1   Title  Patient will increase FOTO score to 66 to demonstrate predicted increase in functional mobility to complete ADLs    Baseline  06/26/17: 49    Time  8    Period  Weeks    Status  New      PT LONG TERM GOAL #2   Title  Pt will decrease worst pain as reported on NPRS by at least 3 points in order  to demonstrate clinically significant reduction in pain.    Baseline  5/21: worst pain 7/10     Time  8    Period  Weeks    Status  New      PT LONG TERM GOAL #3   Title  Patient will report pain not exceeding 2/10 while driving an hour in order to complete his work commutes and visit his daugher without pain    Baseline  5/21: pain up to 7/10 with travel    Time  8    Period  Weeks             Plan - 06/26/17 1704    Clinical Impression Statement  Pt is a 67 year old male with L lateral hip pain around greater trochanter with moderate referral to lateral thigh with no paresthesias/radicular-type symptoms and no clinical findings or special testing indicative of lumbar/SI pathology; clinical presentation consistent with ITB syndrome d/t glute max/min tightness. Pt with secondary pain c/o distal/lateral bicep femoris tightness, and occasional L ant tib tightness. Pt has current activity limitations in prolonged weightbearing activity/prolonged ambulation, prolonged sitting, and stair negotiation. Participation restriction: work duties where he has to drive long commutes and to see his daughter who lives in [place]. Pt will benefit from PT intervention to address the aforementioned impairments and activity limitations for best return to PLOF.    History and Personal Factors relevant to plan of care:  Hamstring sprain in highschool/college, HTN (previous MI)    Clinical Presentation  Evolving    Clinical Presentation due to:  2 personal factors/comorbidities, 3 body systems/activity limitations/participation restrictions     Clinical Decision Making  Moderate    Rehab Potential  Good    Clinical Impairments Affecting Rehab Potential  (+) Motivation, active lifestyle, social support (-) Age, multiple pain sites, chronicity of pain    PT Frequency  2x / week    PT Duration  8 weeks    PT Treatment/Interventions  Aquatic Therapy;Traction;Moist Heat;ADLs/Self Care Home Management;Electrical Stimulation;Cryotherapy;Therapeutic activities;Functional mobility training;Neuromuscular  re-education;Taping;Passive range of motion;Manual techniques;Patient/family education;Therapeutic exercise;Gait training;Stair training;Balance training;Dry needling    PT Next Visit Plan  HEP review, soft tissue techniques/modalities for L LE tightness, squat mechanics review and education    PT Home Exercise Plan  Seated piriformis stretch, seated hamstring stretch, supine thomas stretch    Consulted and Agree with Plan of Care  Patient       Patient will benefit from skilled therapeutic intervention in order to improve the following deficits and impairments:  Decreased mobility, Difficulty walking, Increased muscle spasms, Improper body mechanics, Impaired tone, Decreased range of motion, Decreased strength, Increased fascial restricitons, Impaired flexibility, Pain  Visit Diagnosis: Pain in left leg - Plan: PT plan of care cert/re-cert  Pain in left hip - Plan: PT plan of care cert/re-cert  Acute pain of left knee - Plan: PT plan of care cert/re-cert     Problem List Patient Active Problem List   Diagnosis Date Noted  . Right inguinal hernia 03/20/2014   Staci Acosta PT, DPT Staci Acosta 06/26/2017, 5:24 PM  [place] [facility] PHYSICAL AND SPORTS MEDICINE [address] [place], [address] Phone: [phone]   Fax:  [phone]  Name: Gabriel Butler MRN: [id number] Date of Birth: 1950-11-12

## 2017-06-28 ENCOUNTER — Ambulatory Visit: Payer: BLUE CROSS/BLUE SHIELD | Admitting: Physical Therapy

## 2017-06-28 ENCOUNTER — Encounter: Payer: Self-pay | Admitting: Physical Therapy

## 2017-06-28 DIAGNOSIS — M25552 Pain in left hip: Secondary | ICD-10-CM

## 2017-06-28 DIAGNOSIS — M79605 Pain in left leg: Secondary | ICD-10-CM | POA: Diagnosis not present

## 2017-06-28 NOTE — Therapy (Signed)
[place] [facility] PH[facility]ORTS MEDICINE [address] [place], [address] Phone: [phone]   Fax:  [phone]  Physical Therapy Treatment  Patient Details  Name: Shlomo Seres MRN: [id number] Date of Birth: Apr 30, 1950 Referring Provider: [name] MD   Encounter Date: 06/28/2017    Past Medical History:  Diagnosis Date  . GERD (gastroesophageal reflux disease)   . Heart attack (HCC) 03/05/2013   Coronary stent placement.  . Hypertension     Past Surgical History:  Procedure Laterality Date  . COLONOSCOPY  2013  . CORONARY ANGIOPLASTY WITH STENT PLACEMENT    . HERNIA REPAIR Right 03/26/14   Direct and indirect hernia noted, large ultra Pro mesh utilized.    There were no vitals filed for this visit.  Subjective Assessment - 06/28/17 1047    Subjective  Patient reports that he thinks his stretching has helped his pain, but that it is hard to tell after only 2 days. Patient reports only 3/10 pain today in the hip and some tightness behind the knee.     Pertinent History  Pt is a 67 year old male referred for L IT band syndrome. Patient reports pain in the L hip beginning 2 months ago that has progressed down the lateral aspect of the L leg into the front of the shin. Patient reports pain with descending stairs, prolonged walking, and prolonged driving/traveling (daughter lives in [place]). Patient reports he started meloxicam a couple days ago and that this has helping his pain. Worst pain in past week: 7/10 best: 2/10    Limitations  Sitting;Lifting;House hold activities;Standing;Walking    How long can you sit comfortably?     How long can you stand comfortably?     How long can you walk comfortably?     Diagnostic tests  None    Patient Stated Goals  Travel without pain     Pain Onset  More than a month ago          Manual -STM w/ trigger point release to L glute min/max/med and piriformis (prolonged  time spent here as this is this is patients chief pain complaint, Patient's trigger points medial to GT and inferior to lateral posterior border of iliac crest -STM w/ trigger point release to L rectus femoris as this is patient's secondary pain complaint with muscle spasms to trigger point release that resolves after increased time spent here    ESTIM + heat pack HiVolt ESTIM 15 min at patient tolerated 375V increased to 390V through treatment at L glute max trigger point . Attempted to decrease muscle spasm and tightness in patient's chief complaint area. With PT assessing patient tolerance throughout (increasing intensity as needed), monitoring skin integrity (normal), with decreased pain noted from patient following with no pain noted                         PT Short Term Goals - 06/26/17 1659      PT SHORT TERM GOAL #1   Title  Pt will be independent with HEP in order to improve strength and balance in order to decrease fall risk and improve function at home and work.    Time  4    Period  Weeks    Status  New        PT Long Term Goals - 06/26/17 1700      PT LONG TERM GOAL #1   Title  Patient will increase FOTO score to 66 to demonstrate predicted increase in functional mobility to complete ADLs    Baseline  06/26/17: 49    Time  8    Period  Weeks    Status  New      PT LONG TERM GOAL #2   Title  Pt will decrease worst pain as reported on NPRS by at least 3 points in order to demonstrate clinically significant reduction in pain.    Baseline  5/21: worst pain 7/10    Time  8    Period  Weeks    Status  New      PT LONG TERM GOAL #3   Title  Patient will report pain not exceeding 2/10 while driving an hour in order to complete his work commutes and visit his daugher without pain    Baseline  5/21: pain up to 7/10 with travel    Time  8    Period  Weeks              Patient will benefit from skilled therapeutic intervention in order to improve  the following deficits and impairments:     Visit Diagnosis: No diagnosis found.     Problem List Patient Active Problem List   Diagnosis Date Noted  . Right inguinal hernia 03/20/2014   Staci Acosta PT, DPT Staci Acosta 06/28/2017, 10:49 AM  [place] [facility] PHYSICAL AND SPORTS MEDICINE [address], [place], [address] Phone: [phone]   Fax:  [phone]  Name: Honor Bertino MRN: [id number] Date of Birth: 05-23-1950

## 2017-07-05 ENCOUNTER — Encounter: Payer: Self-pay | Admitting: Physical Therapy

## 2017-07-05 ENCOUNTER — Ambulatory Visit: Payer: BLUE CROSS/BLUE SHIELD | Admitting: Physical Therapy

## 2017-07-05 DIAGNOSIS — M25552 Pain in left hip: Secondary | ICD-10-CM

## 2017-07-05 DIAGNOSIS — M79605 Pain in left leg: Secondary | ICD-10-CM | POA: Diagnosis not present

## 2017-07-05 NOTE — Therapy (Signed)
[place] [facility] PHYSICAL [facility] SPORTS MEDICINE [address] [place], [address] Phone: [phone]   Fax:  [phone]  Physical Therapy Treatment  Patient Details  Name: Gabriel Butler MRN: [id number] Date of Birth: 31-Mar-1950 Referring Provider: [name] MD   Encounter Date: 07/05/2017  PT End of Session - 07/05/17 1620    Visit Number  3    Number of Visits  17    Date for PT Re-Evaluation  08/21/17    PT Start Time  0410    PT Stop Time  0448    PT Time Calculation (min)  38 min    Activity Tolerance  Patient tolerated treatment well    Behavior During Therapy  [facility] for tasks assessed/performed       Past Medical History:  Diagnosis Date  . GERD (gastroesophageal reflux disease)   . Heart attack (HCC) 03/05/2013   Coronary stent placement.  . Hypertension     Past Surgical History:  Procedure Laterality Date  . COLONOSCOPY  2013  . CORONARY ANGIOPLASTY WITH STENT PLACEMENT    . HERNIA REPAIR Right 03/26/14   Direct and indirect hernia noted, large ultra Pro mesh utilized.    There were no vitals filed for this visit.  Subjective Assessment - 07/05/17 1616    Subjective  Patient reports he had good pain relief following last session, but has increased pain to 4/10 after driving to/from [place] this past weekend and completing yard work. Patient reports compliance with his HEP, noting that his stretches have been "very helpful    Pertinent History  Pt is a 67 year old male referred for L IT band syndrome. Patient reports pain in the L hip beginning 2 months ago that has progressed down the lateral aspect of the L leg into the front of the shin. Patient reports pain with descending stairs, prolonged walking, and prolonged driving/traveling (daughter lives in [place]). Patient reports he started meloxicam a couple days ago and that this has helping his pain. Worst pain in past week: 7/10 best: 2/10    Limitations   Sitting;Lifting;House hold activities;Standing;Walking    How long can you sit comfortably?     How long can you stand comfortably?     How long can you walk comfortably?     Diagnostic tests  None    Patient Stated Goals  Travel without pain     Pain Onset  More than a month ago             Manual -STM w/ trigger point release to L glute min/max/med and piriformis, less time spent here than previous session, as patient reports this area is not giving him as much pain and there is objectively less tightness/trigger points are easily resolved.  -STM w/ trigger point release to L biceps femoris near distal muscle belly as this is patient's chief pain complaint with muscle spasms and trigger points that resolve after increased time spent here -(following ESTIM) Hamstring contract relax 10sec contract 20 relax with increased stretch each bout 4x with equal hamstring length with 90/90 testing following   ESTIM+ heat packHiVolt ESTIM15 min at patient tolerated165Vincreased to190V through treatmentat L biceps femoris trigger point. Attempted to decrease muscle spasm and tightness in patient's chief complaint area. With PT assessing patient tolerance throughout (increasing intensity as needed), monitoring skin integrity (normal), with decreased pain noted from patient following. Utilized time to reinforce HEP and encourage patient to complete stretching during long  drives to prevent muscle cramping/tightening.                     PT Education - 07/05/17 1634    Education provided  Yes    Education Details  Intermittent standing/stretching with prolonged sitting to prevent hamstring tightness    Person(s) Educated  Patient    Methods  Explanation;Verbal cues    Comprehension  Verbalized understanding       PT Short Term Goals - 06/26/17 1659      PT SHORT TERM GOAL #1   Title  Pt will be independent with HEP in order to improve strength and  balance in order to decrease fall risk and improve function at home and work.    Time  4    Period  Weeks    Status  New        PT Long Term Goals - 06/26/17 1700      PT LONG TERM GOAL #1   Title  Patient will increase FOTO score to 66 to demonstrate predicted increase in functional mobility to complete ADLs    Baseline  06/26/17: 49    Time  8    Period  Weeks    Status  New      PT LONG TERM GOAL #2   Title  Pt will decrease worst pain as reported on NPRS by at least 3 points in order to demonstrate clinically significant reduction in pain.    Baseline  5/21: worst pain 7/10    Time  8    Period  Weeks    Status  New      PT LONG TERM GOAL #3   Title  Patient will report pain not exceeding 2/10 while driving an hour in order to complete his work commutes and visit his daugher without pain    Baseline  5/21: pain up to 7/10 with travel    Time  8    Period  Weeks            Plan - 07/05/17 1842    Clinical Impression Statement  Patient is continuing to respond well to manual + modality treatment with chief complaint today at lateral hamstring musculature, rather than glute. Patient is demonstrating decreased glute tightness and pain, which is encouraging. PT continued to reinfoce HEP compliance for maintainence of PT gains.     Rehab Potential  Good    Clinical Impairments Affecting Rehab Potential  (+) Motivation, active lifestyle, social support (-) Age, multiple pain sites, chronicity of pain    PT Frequency  2x / week    PT Duration  8 weeks    PT Treatment/Interventions  Aquatic Therapy;Traction;Moist Heat;ADLs/Self Care Home Management;Electrical Stimulation;Cryotherapy;Therapeutic activities;Functional mobility training;Neuromuscular re-education;Taping;Passive range of motion;Manual techniques;Patient/family education;Therapeutic exercise;Gait training;Stair training;Balance training;Dry needling    PT Next Visit Plan  soft tissue techniques/modalities for L LE  tightness, squat mechanics review and education    PT Home Exercise Plan  Seated piriformis stretch, seated hamstring stretch, supine thomas stretch    Consulted and Agree with Plan of Care  Patient       Patient will benefit from skilled therapeutic intervention in order to improve the following deficits and impairments:  Decreased mobility, Difficulty walking, Increased muscle spasms, Improper body mechanics, Impaired tone, Decreased range of motion, Decreased strength, Increased fascial restricitons, Impaired flexibility, Pain  Visit Diagnosis: Pain in left hip     Problem List Patient Active Problem List   Diagnosis Date  Noted  . Right inguinal hernia 03/20/2014   Staci Acosta PT, DPT Staci Acosta 07/05/2017, 6:44 PM  [place] [facility] PHYSICAL AND SPORTS MEDICINE [address] [place], [address] Phone: [phone]   Fax:  [phone]  Name: Gabriel Butler MRN: [id number] Date of Birth: [birth year]

## 2017-07-10 ENCOUNTER — Ambulatory Visit: Payer: BLUE CROSS/BLUE SHIELD | Attending: Family Medicine | Admitting: Physical Therapy

## 2017-07-10 ENCOUNTER — Encounter: Payer: Self-pay | Admitting: Physical Therapy

## 2017-07-10 DIAGNOSIS — M25552 Pain in left hip: Secondary | ICD-10-CM | POA: Insufficient documentation

## 2017-07-10 DIAGNOSIS — M25562 Pain in left knee: Secondary | ICD-10-CM | POA: Insufficient documentation

## 2017-07-10 DIAGNOSIS — M79605 Pain in left leg: Secondary | ICD-10-CM

## 2017-07-10 NOTE — Therapy (Signed)
[facility] [address] [place], [address] Phone: [phone]   Fax:  [phone]  Physical Therapy Treatment  Patient Details  Name: Gabriel Butler MRN: [id number] Date of Birth: Jun 28, 1950 Referring Provider: [name] MD   Encounter Date: 07/10/2017  PT End of Session - 07/10/17 0847    Visit Number  4    Number of Visits  17    Date for PT Re-Evaluation  08/21/17    PT Start Time  0845    PT Stop Time  0929    PT Time Calculation (min)  44 min    Activity Tolerance  Patient tolerated treatment well    Behavior During Therapy  [name] for tasks assessed/performed       Past Medical History:  Diagnosis Date  . GERD (gastroesophageal reflux disease)   . Heart attack (HCC) 03/05/2013   Coronary stent placement.  . Hypertension     Past Surgical History:  Procedure Laterality Date  . COLONOSCOPY  2013  . CORONARY ANGIOPLASTY WITH STENT PLACEMENT    . HERNIA REPAIR Right 03/26/14   Direct and indirect hernia noted, large ultra Pro mesh utilized.    There were no vitals filed for this visit.  Subjective Assessment - 07/10/17 0848    Subjective  Pt reports his LLE still tightens up after he tries to do too much.  Pt thinks the HEP is helping.  Pt is wondering how often should he do his stretches as he is wondering if he is doing too little or too much.      Pertinent History  Pt is a 67 year old male referred for L IT band syndrome. Patient reports pain in the L hip beginning 2 months ago that has progressed down the lateral aspect of the L leg into the front of the shin. Patient reports pain with descending stairs, prolonged walking, and prolonged driving/traveling (daughter lives in [place]). Patient reports he started meloxicam a couple days ago and that this has helping his pain. Worst pain in past week: 7/10 best: 2/10    Limitations  Sitting;Lifting;House hold activities;Standing;Walking    How  long can you sit comfortably?     How long can you stand comfortably?     How long can you walk comfortably?     Diagnostic tests  None    Patient Stated Goals  Travel without pain     Currently in Pain?  Yes    Pain Score  3     Pain Location  Knee    Pain Orientation  Left;Lateral    Pain Descriptors / Indicators  Aching;Tightness    Pain Type  Chronic pain    Pain Onset  More than a month ago        TREATMENT   Manual: -STM w/ trigger point release to L glute min and med as mild trigger points noted in these muscles  -STM w/ trigger point release to L biceps femoris near distal muscle belly as pt continues to demonstrate TTP and reports this region as his chief complaint area   Therapeutic Exercise:  -Sidelying L ITB crossbody stretch 3x45 seconds  -Supine L ITB crossbody stretch 3x45 seconds  -Hamstring contract relax 10sec contract 20 relax with increased stretch each bout 4x    ESTIM + heat pack HiVolt ESTIM 15 min at patient tolerated 165V increased to 190V through treatment at L biceps femoris?trigger point?Marland Kitchen Attempted?to decrease muscle  spasm and tightness in patient's chief complaint area. With PT assessing patient tolerance throughout (increasing intensity as needed), monitoring skin integrity (normal), with decreased pain noted from patient?following. Utilized time to reinforce guidelines for HEP and for techniques to decrease pain while driving.                         PT Education - 07/10/17 0847    Education provided  Yes    Education Details  Exercise technique; encouraged pt to reference HEP handout sheet when he is unsure how frequently to perform his stretches/exercises; keep his LLE moving while he is driving (flexing and extending)     Person(s) Educated  Patient    Methods  Explanation;Demonstration;Verbal cues    Comprehension  Verbalized understanding;Returned demonstration;Verbal cues required;Need further  instruction       PT Short Term Goals - 06/26/17 1659      PT SHORT TERM GOAL #1   Title  Pt will be independent with HEP in order to improve strength and balance in order to decrease fall risk and improve function at home and work.    Time  4    Period  Weeks    Status  New        PT Long Term Goals - 06/26/17 1700      PT LONG TERM GOAL #1   Title  Patient will increase FOTO score to 66 to demonstrate predicted increase in functional mobility to complete ADLs    Baseline  06/26/17: 49    Time  8    Period  Weeks    Status  New      PT LONG TERM GOAL #2   Title  Pt will decrease worst pain as reported on NPRS by at least 3 points in order to demonstrate clinically significant reduction in pain.    Baseline  5/21: worst pain 7/10    Time  8    Period  Weeks    Status  New      PT LONG TERM GOAL #3   Title  Patient will report pain not exceeding 2/10 while driving an hour in order to complete his work commutes and visit his daugher without pain    Baseline  5/21: pain up to 7/10 with travel    Time  8    Period  Weeks            Plan - 07/10/17 1610    Clinical Impression Statement  Pt demonstrates mild residual trigger points in L glute min and med which was addressed with STM and ischemic compression.  The pt denies TTP along L IT Band but this region was noted to be especially tight upon palpation from this PT.  Thus had pt complete stretches to L IT Band region in an effort to decrease tension, tightness, and pain.  Continued efforts to stretch L hamstring as well as this region demonstrates tightness and TTP as well.  Pt will benefit from continued skilled PT interventions for decreased pain and improved QOL.     Rehab Potential  Good    Clinical Impairments Affecting Rehab Potential  (+) Motivation, active lifestyle, social support (-) Age, multiple pain sites, chronicity of pain    PT Frequency  2x / week    PT Duration  8 weeks    PT Treatment/Interventions   Aquatic Therapy;Traction;Moist Heat;ADLs/Self Care Home Management;Electrical Stimulation;Cryotherapy;Therapeutic activities;Functional mobility training;Neuromuscular re-education;Taping;Passive range of motion;Manual techniques;Patient/family education;Therapeutic exercise;Gait  training;Stair training;Balance training;Dry needling    PT Next Visit Plan  soft tissue techniques/modalities for L LE tightness, squat mechanics review and education    PT Home Exercise Plan  Seated piriformis stretch, seated hamstring stretch, supine thomas stretch    Consulted and Agree with Plan of Care  Patient       Patient will benefit from skilled therapeutic intervention in order to improve the following deficits and impairments:  Decreased mobility, Difficulty walking, Increased muscle spasms, Improper body mechanics, Impaired tone, Decreased range of motion, Decreased strength, Increased fascial restricitons, Impaired flexibility, Pain  Visit Diagnosis: Pain in left hip  Acute pain of left knee  Pain in left leg     Problem List Patient Active Problem List   Diagnosis Date Noted  . Right inguinal hernia 03/20/2014    Encarnacion Chu PT, DPT 07/10/2017, 9:18 AM  [facility] PHYSICAL AND SPORTS MEDICINE [address], [place], [address] Phone: [phone]   Fax:  [phone]  Name: Laurel Simard MRN: [id number] Date of Birth: [birth year]

## 2017-07-12 ENCOUNTER — Ambulatory Visit: Payer: BLUE CROSS/BLUE SHIELD | Admitting: Physical Therapy

## 2017-07-16 ENCOUNTER — Encounter: Payer: Self-pay | Admitting: Physical Therapy

## 2017-07-16 ENCOUNTER — Ambulatory Visit: Payer: BLUE CROSS/BLUE SHIELD | Admitting: Physical Therapy

## 2017-07-16 DIAGNOSIS — M25552 Pain in left hip: Secondary | ICD-10-CM | POA: Diagnosis not present

## 2017-07-16 NOTE — Therapy (Signed)
[facility] PHYS[facility] SPORTS MEDICINE [address] [place], [address] Phone: [phone]   Fax:  [phone]  Physical Therapy Treatment  Patient Details  Name: Gabriel Butler MRN: [id number] Date of Birth: [birth year] Referring Provider: [name] MD   Encounter Date: 07/16/2017  PT End of Session - 07/16/17 1638    Visit Number  5    Number of Visits  17    Date for PT Re-Evaluation  08/21/17    PT Start Time  0400    PT Stop Time  0445    PT Time Calculation (min)  45 min    Activity Tolerance  Patient tolerated treatment well    Behavior During Therapy  [facility] for tasks assessed/performed       Past Medical History:  Diagnosis Date  . GERD (gastroesophageal reflux disease)   . Heart attack (HCC) 03/05/2013   Coronary stent placement.  . Hypertension     Past Surgical History:  Procedure Laterality Date  . COLONOSCOPY  2013  . CORONARY ANGIOPLASTY WITH STENT PLACEMENT    . HERNIA REPAIR Right 03/26/14   Direct and indirect hernia noted, large ultra Pro mesh utilized.    There were no vitals filed for this visit.  Subjective Assessment - 07/16/17 1605    Subjective  Pt reports decreased pain following last session with the majority of his returning pain being at the distal hamstring. Patient reports 4/10 pain today, and reports he was able to do the eliptical the morning for 30 minutes with just "some tightness" following. Patient reports he is having less pain with driving long distances, which he is happy with. Patient reports compliances with HEP with no questions/concerns    Pertinent History  Pt is a 67 year old male referred for L IT band syndrome. Patient reports pain in the L hip beginning 2 months ago that has progressed down the lateral aspect of the L leg into the front of the shin. Patient reports pain with descending stairs, prolonged walking, and prolonged driving/traveling (daughter lives in [place]). Patient  reports he started meloxicam a couple days ago and that this has helping his pain. Worst pain in past week: 7/10 best: 2/10    Limitations  Sitting;Lifting;House hold activities;Standing;Walking    How long can you sit comfortably?     How long can you stand comfortably?     How long can you walk comfortably?     Diagnostic tests  None    Patient Stated Goals  Travel without pain     Pain Onset  More than a month ago          Manual -STM w/trigger point releaseto L glute min/max/med and piriformis, less time spent here than previous session, as patient reports this area is not giving him as much pain and there is objectively less tightness/trigger points are easily resolved.  -STM w/trigger point releaseto Lbiceps femoris near distal muscle belly as this is patient's chief pain complaint with muscle spasms andtrigger pointsthat resolve after increased time spent here. Following manual techniques: Dry needling(4) 60mm .30needles placed along R biceps femoris with the patient positioned in prone. Patient was educated on risks and benefits of therapy and verbally consents to PT.    Ther-Ex -(following ESTIM) Seated hamstring 30sec hold x3 with education to continue stretching, especially post DN to maintain muscle lengthening gains.  -Supine cross body stretch for glute stretch   ESTIM+ heat packHiVolt ESTIM10 min  at patient tolerated290Vincreased to295V through treatmentatL biceps femoris trigger point. Attemptedto decrease muscle spasm and tightness in patient's chief complaint area. With PT assessing patient tolerance throughout (increasing intensity as needed), monitoring skin integrity (normal), with decreased pain noted from patientfollowing. Utilized time to reinforce HEP and encourage patient to complete stretching to maintain DN gains                        PT Education - 07/16/17 1637    Education provided  Yes     Education Details  DN education    Person(s) Educated  Patient    Methods  Explanation;Verbal cues    Comprehension  Verbal cues required;Verbalized understanding       PT Short Term Goals - 06/26/17 1659      PT SHORT TERM GOAL #1   Title  Pt will be independent with HEP in order to improve strength and balance in order to decrease fall risk and improve function at home and work.    Time  4    Period  Weeks    Status  New        PT Long Term Goals - 06/26/17 1700      PT LONG TERM GOAL #1   Title  Patient will increase FOTO score to 66 to demonstrate predicted increase in functional mobility to complete ADLs    Baseline  06/26/17: 49    Time  8    Period  Weeks    Status  New      PT LONG TERM GOAL #2   Title  Pt will decrease worst pain as reported on NPRS by at least 3 points in order to demonstrate clinically significant reduction in pain.    Baseline  5/21: worst pain 7/10    Time  8    Period  Weeks    Status  New      PT LONG TERM GOAL #3   Title  Patient will report pain not exceeding 2/10 while driving an hour in order to complete his work commutes and visit his daugher without pain    Baseline  5/21: pain up to 7/10 with travel    Time  8    Period  Weeks            Plan - 07/16/17 1645    Clinical Impression Statement  Patient is continuing to respond well to manual techniques + estim with some lingering trigger points at distal biceps femoris. PT utilized DN + manual today to attempt to resolve soft tissue restrictions in this area. PT encouraged patient to continue stretaching to maintain progress from therapy.     Rehab Potential  Good    Clinical Impairments Affecting Rehab Potential  (+) Motivation, active lifestyle, social support (-) Age, multiple pain sites, chronicity of pain    PT Frequency  2x / week    PT Duration  8 weeks    PT Treatment/Interventions  Aquatic Therapy;Traction;Moist Heat;ADLs/Self Care Home Management;Electrical  Stimulation;Cryotherapy;Therapeutic activities;Functional mobility training;Neuromuscular re-education;Taping;Passive range of motion;Manual techniques;Patient/family education;Therapeutic exercise;Gait training;Stair training;Balance training;Dry needling    PT Next Visit Plan  soft tissue techniques/modalities for L LE tightness, squat mechanics review and education    PT Home Exercise Plan  Seated piriformis stretch, seated hamstring stretch, supine thomas stretch    Consulted and Agree with Plan of Care  Patient       Patient will benefit from skilled therapeutic intervention in order to  improve the following deficits and impairments:  Decreased mobility, Difficulty walking, Increased muscle spasms, Improper body mechanics, Impaired tone, Decreased range of motion, Decreased strength, Increased fascial restricitons, Impaired flexibility, Pain  Visit Diagnosis: Pain in left hip     Problem List Patient Active Problem List   Diagnosis Date Noted  . Right inguinal hernia 03/20/2014   Staci Acosta PT, DPT Staci Acosta 07/16/2017, 5:00 PM  [place] [facility] PHYSICAL AND SPORTS MEDICINE [address], [place], [address] Phone: [phone]   Fax:  [phone]  Name: Jeris Roser MRN: [id number] Date of Birth: 27-Sep-1950

## 2017-07-19 ENCOUNTER — Ambulatory Visit: Payer: BLUE CROSS/BLUE SHIELD | Admitting: Physical Therapy

## 2017-07-19 ENCOUNTER — Encounter: Payer: Self-pay | Admitting: Physical Therapy

## 2017-07-19 DIAGNOSIS — M25552 Pain in left hip: Secondary | ICD-10-CM | POA: Diagnosis not present

## 2017-07-19 NOTE — Therapy (Signed)
[facility] [facility]ORTS MEDICINE [address], [place], [address] Phone: [phone]   Fax:  [phone]  Physical Therapy Treatment  Patient Details  Name: Gabriel Butler MRN: [id number] Date of Birth: Jul 29, 1950 Referring Provider: [name] MD   Encounter Date: 07/19/2017  PT End of Session - 07/19/17 1811    Visit Number  6    Number of Visits  17    Date for PT Re-Evaluation  08/21/17    PT Start Time  0545    PT Stop Time  0630    PT Time Calculation (min)  45 min    Activity Tolerance  Patient tolerated treatment well    Behavior During Therapy  [facility] for tasks assessed/performed       Past Medical History:  Diagnosis Date  . GERD (gastroesophageal reflux disease)   . Heart attack (HCC) 03/05/2013   Coronary stent placement.  . Hypertension     Past Surgical History:  Procedure Laterality Date  . COLONOSCOPY  2013  . CORONARY ANGIOPLASTY WITH STENT PLACEMENT    . HERNIA REPAIR Right 03/26/14   Direct and indirect hernia noted, large ultra Pro mesh utilized.    There were no vitals filed for this visit.  Subjective Assessment - 07/19/17 1750    Subjective  Pt reports he thinks the DN from last session was effective in "loosen" his muscle so that he can continue his stretches. Patient reports he "thinks he is getting better" and is having minimal pain today. Reports Tuesday he did a lot of yard work, and was surprised he was not in more pain yesterday, reporting muscle soreness was "not bad". Reports HEP compliance with no questions or concerns.     Pertinent History  Pt is a 67 year old male referred for L IT band syndrome. Patient reports pain in the L hip beginning 2 months ago that has progressed down the lateral aspect of the L leg into the front of the shin. Patient reports pain with descending stairs, prolonged walking, and prolonged driving/traveling (daughter lives in [place]). Patient reports he started  meloxicam a couple days ago and that this has helping his pain. Worst pain in past week: 7/10 best: 2/10    Limitations  Sitting;Lifting;House hold activities;Standing;Walking    How long can you sit comfortably?     How long can you stand comfortably?     How long can you walk comfortably?     Diagnostic tests  None    Patient Stated Goals  Travel without pain     Pain Onset  More than a month ago           Manual -STM w/trigger point releaseto L glute min/max/med and piriformis,continued less time spent, as patient reports this area is not giving him as much pain and there is objectively less tightness/trigger points are easily resolved. -STM w/trigger point releaseto Lbicepsfemoris, and R ant tib near distal muscle bellyas this is patient's chiefpain complaint with muscle spasms andtrigger pointsthat resolve after increased time spent here. Following manual techniques: Dry needling(3) 60mm .30needles placed along R biceps femoris with the patient positioned inprone. Patient was educated on risks and benefits of therapy and verbally consents to PT. Following, (3) 30mm .30needles placed along R ant tib   Ther-Ex -(following DN) Seated hamstring 30sec hold x3 with education to continue stretching, especially post DN to maintain muscle lengthening gains.  -Bridge exercise- initially patient reports hamstring cramping,  so PT elicited 10x glute set with VC of proper exercise form with glute contraction, pt was then able to complete 3x 10 with min ROM with proper form -Attempted prone hip ext with 90d knee bend, patient was unable to complete without hamstring cramp -Prone hip ext with glute set prior 3x 10 with mod cuing from PT for eccentric control                      PT Education - 07/19/17 1810    Education provided  Yes    Education Details  Exercise form    Person(s) Educated  Patient    Methods   Explanation;Demonstration;Tactile cues;Verbal cues    Comprehension  Verbal cues required;Tactile cues required;Returned demonstration;Verbalized understanding       PT Short Term Goals - 06/26/17 1659      PT SHORT TERM GOAL #1   Title  Pt will be independent with HEP in order to improve strength and balance in order to decrease fall risk and improve function at home and work.    Time  4    Period  Weeks    Status  New        PT Long Term Goals - 06/26/17 1700      PT LONG TERM GOAL #1   Title  Patient will increase FOTO score to 66 to demonstrate predicted increase in functional mobility to complete ADLs    Baseline  06/26/17: 49    Time  8    Period  Weeks    Status  New      PT LONG TERM GOAL #2   Title  Pt will decrease worst pain as reported on NPRS by at least 3 points in order to demonstrate clinically significant reduction in pain.    Baseline  5/21: worst pain 7/10    Time  8    Period  Weeks    Status  New      PT LONG TERM GOAL #3   Title  Patient will report pain not exceeding 2/10 while driving an hour in order to complete his work commutes and visit his daugher without pain    Baseline  5/21: pain up to 7/10 with travel    Time  8    Period  Weeks            Plan - 07/19/17 1827    Clinical Impression Statement  Upon PT assessment today pt has trigger points in ant tibialis, previously thought to be referred from ITB insertion. Patient is continuing to respond well to manual + DN techniques at these areas. PT led patient through therex aimed at glute activation, wihch patient required extensive cuing and education on the importance of the glutes activating properly as opposed to over activation of the hamstrings to ensure proper movement mechanics, and to decrease hamstring tightness long-term. Patient was able to comply with cuing to complete exercises with accuracy and verbalized understanding of all educaiton given.     Rehab Potential  Good    PT  Frequency  2x / week    PT Duration  8 weeks    PT Treatment/Interventions  Aquatic Therapy;Traction;Moist Heat;ADLs/Self Care Home Management;Electrical Stimulation;Cryotherapy;Therapeutic activities;Functional mobility training;Neuromuscular re-education;Taping;Passive range of motion;Manual techniques;Patient/family education;Therapeutic exercise;Gait training;Stair training;Balance training;Dry needling    PT Next Visit Plan  soft tissue techniques/modalities for L LE tightness, squat mechanics review and education    PT Home Exercise Plan  Seated piriformis stretch, seated  hamstring stretch, supine thomas stretch    Consulted and Agree with Plan of Care  Patient       Patient will benefit from skilled therapeutic intervention in order to improve the following deficits and impairments:  Decreased mobility, Difficulty walking, Increased muscle spasms, Improper body mechanics, Impaired tone, Decreased range of motion, Decreased strength, Increased fascial restricitons, Impaired flexibility, Pain  Visit Diagnosis: Pain in left hip     Problem List Patient Active Problem List   Diagnosis Date Noted  . Right inguinal hernia 03/20/2014   Staci Acosta PT, DPT Staci Acosta 07/19/2017, 6:30 PM  [place] [facility] PHYSICAL AND SPORTS MEDICINE [address], [place], [address] Phone: [phone]   Fax:  [phone]  Name: Gabriel Butler MRN: [id number] Date of Birth: 05/17/1950

## 2017-07-24 ENCOUNTER — Ambulatory Visit: Payer: BLUE CROSS/BLUE SHIELD | Admitting: Physical Therapy

## 2017-07-24 ENCOUNTER — Encounter: Payer: Self-pay | Admitting: Physical Therapy

## 2017-07-24 DIAGNOSIS — M25552 Pain in left hip: Secondary | ICD-10-CM

## 2017-07-24 NOTE — Therapy (Signed)
[facility] [address] [place], [address] Phone: [phone]   Fax:  [phone]  Physical Therapy Treatment  Patient Details  Name: Gabriel Butler MRN: [id number] Date of Birth: 12-10-1950 Referring Provider: [name] MD   Encounter Date: 07/24/2017  PT End of Session - 07/24/17 1608    Visit Number  7    Number of Visits  17    Date for PT Re-Evaluation  08/21/17    PT Start Time  0400    PT Stop Time  0445    PT Time Calculation (min)  45 min    Activity Tolerance  Patient tolerated treatment well    Behavior During Therapy  [facility] for tasks assessed/performed       Past Medical History:  Diagnosis Date  . GERD (gastroesophageal reflux disease)   . Heart attack (HCC) 03/05/2013   Coronary stent placement.  . Hypertension     Past Surgical History:  Procedure Laterality Date  . COLONOSCOPY  2013  . CORONARY ANGIOPLASTY WITH STENT PLACEMENT    . HERNIA REPAIR Right 03/26/14   Direct and indirect hernia noted, large ultra Pro mesh utilized.    There were no vitals filed for this visit.  Subjective Assessment - 07/24/17 1605    Subjective  Pt reports he is still seeing pain reduction from DN + HEP, wihch he is pleased with. Patient reports only 2/10 pain today . Patient reports compliance with his HEP.     Pertinent History  Pt is a 67 year old male referred for L IT band syndrome. Patient reports pain in the L hip beginning 2 months ago that has progressed down the lateral aspect of the L leg into the front of the shin. Patient reports pain with descending stairs, prolonged walking, and prolonged driving/traveling (daughter lives in [place]). Patient reports he started meloxicam a couple days ago and that this has helping his pain. Worst pain in past week: 7/10 best: 2/10    Limitations  Sitting;Lifting;House hold activities;Standing;Walking    How long can you sit comfortably?     How  long can you stand comfortably?     How long can you walk comfortably?     Diagnostic tests  None    Patient Stated Goals  Travel without pain     Pain Onset  More than a month ago      Manual -STM with trigger point release to L biceps femoris  -During 3rd set of prone hip ext (rep 8) patient had a mild hamstring cramp that subsided quickly with STM    Ther-Ex -Bridge 2x 10 1x 12 with glute set prior for proper glute activation with min cuing for eccentric control -Prone hip ext with knee bend 3x 10 with min cuing to prevent pelvic rotation compensation -Supine clamshell green tband 3x 10 with cuing for eccentric control -Sidelying reverse clamshell green tband 3x 10 with cuing for proper form needed initially -Mini squat 2x 10 at treadmill bar for balance support -Ant tib stretch 2x 30sec holds                      PT Education - 07/24/17 1608    Education provided  Yes    Education Details  Exercise form    Person(s) Educated  Patient    Methods  Explanation;Demonstration;Tactile cues;Verbal cues    Comprehension  Verbal cues required;Returned demonstration;Verbalized understanding  PT Short Term Goals - 06/26/17 1659      PT SHORT TERM GOAL #1   Title  Pt will be independent with HEP in order to improve strength and balance in order to decrease fall risk and improve function at home and work.    Time  4    Period  Weeks    Status  New        PT Long Term Goals - 06/26/17 1700      PT LONG TERM GOAL #1   Title  Patient will increase FOTO score to 66 to demonstrate predicted increase in functional mobility to complete ADLs    Baseline  06/26/17: 49    Time  8    Period  Weeks    Status  New      PT LONG TERM GOAL #2   Title  Pt will decrease worst pain as reported on NPRS by at least 3 points in order to demonstrate clinically significant reduction in pain.    Baseline  5/21: worst pain 7/10    Time  8    Period  Weeks     Status  New      PT LONG TERM GOAL #3   Title  Patient will report pain not exceeding 2/10 while driving an hour in order to complete his work commutes and visit his daugher without pain    Baseline  5/21: pain up to 7/10 with travel    Time  8    Period  Weeks            Plan - 07/24/17 1645    Clinical Impression Statement  Patient is demonstrating less pain, allowing further therex propgression, which patient is able to complete with accuracy and no increased pain following PT cuing. Patient asked PT for a print out of exercises, which PT gave him with instructions to complete 3-4x/week at a set range of 3-5 rep range of 5-10.     Rehab Potential  Good    Clinical Impairments Affecting Rehab Potential  (+) Motivation, active lifestyle, social support (-) Age, multiple pain sites, chronicity of pain    PT Frequency  2x / week    PT Duration  8 weeks    PT Treatment/Interventions  Aquatic Therapy;Traction;Moist Heat;ADLs/Self Care Home Management;Electrical Stimulation;Cryotherapy;Therapeutic activities;Functional mobility training;Neuromuscular re-education;Taping;Passive range of motion;Manual techniques;Patient/family education;Therapeutic exercise;Gait training;Stair training;Balance training;Dry needling    PT Next Visit Plan  soft tissue techniques/modalities for L LE tightness, squat mechanics review and education    PT Home Exercise Plan  Seated piriformis stretch, seated hamstring stretch, supine thomas stretch    Consulted and Agree with Plan of Care  Patient       Patient will benefit from skilled therapeutic intervention in order to improve the following deficits and impairments:  Decreased mobility, Difficulty walking, Increased muscle spasms, Improper body mechanics, Impaired tone, Decreased range of motion, Decreased strength, Increased fascial restricitons, Impaired flexibility, Pain  Visit Diagnosis: Pain in left hip     Problem List Patient Active Problem List    Diagnosis Date Noted  . Right inguinal hernia 03/20/2014   Staci Acosta PT, DPT Staci Acosta 07/24/2017, 4:48 PM  [facility] PHYSICAL AND SPORTS MEDICINE [address] [place], [address] Phone: [phone]   Fax:  [phone]  Name: Gabriel Butler MRN: [id number] Date of Birth: [birth year]

## 2017-07-26 ENCOUNTER — Encounter: Payer: BLUE CROSS/BLUE SHIELD | Admitting: Physical Therapy

## 2017-07-30 ENCOUNTER — Ambulatory Visit: Payer: BLUE CROSS/BLUE SHIELD

## 2017-07-30 DIAGNOSIS — M25552 Pain in left hip: Secondary | ICD-10-CM

## 2017-07-30 DIAGNOSIS — M25562 Pain in left knee: Secondary | ICD-10-CM

## 2017-07-30 NOTE — Therapy (Signed)
[facility] PH[facility] [address] [place], [address] Phone: [phone]   Fax:  [phone]  Physical Therapy Treatment  Patient Details  Name: Gabriel Butler MRN: [id number] Date of Birth: Mar 28, 1950 Referring Provider: [name] MD   Encounter Date: 07/30/2017  PT End of Session - 07/30/17 1633    Visit Number  8    Number of Visits  17    Date for PT Re-Evaluation  08/21/17    PT Start Time  1635    PT Stop Time  1715    PT Time Calculation (min)  40 min    Activity Tolerance  Patient tolerated treatment well    Behavior During Therapy  [facility] for tasks assessed/performed       Past Medical History:  Diagnosis Date  . GERD (gastroesophageal reflux disease)   . Heart attack (HCC) 03/05/2013   Coronary stent placement.  . Hypertension     Past Surgical History:  Procedure Laterality Date  . COLONOSCOPY  2013  . CORONARY ANGIOPLASTY WITH STENT PLACEMENT    . HERNIA REPAIR Right 03/26/14   Direct and indirect hernia noted, large ultra Pro mesh utilized.    There were no vitals filed for this visit.  Subjective Assessment - 07/30/17 1630    Subjective  Pt reports that his leg was good over the weekend but has been bothering him more today. He states that he has driven approximately 160 miles in the car today. No specific questions or concerns at this time.     Pertinent History  Pt is a 66 year old male referred for L IT band syndrome. Patient reports pain in the L hip beginning 2 months ago that has progressed down the lateral aspect of the L leg into the front of the shin. Patient reports pain with descending stairs, prolonged walking, and prolonged driving/traveling (daughter lives in [place]). Patient reports he started meloxicam a couple days ago and that this has helping his pain. Worst pain in past week: 7/10 best: 2/10    Limitations  Sitting;Lifting;House hold activities;Standing;Walking    How long  can you sit comfortably?     How long can you stand comfortably?     How long can you walk comfortably?     Diagnostic tests  None    Patient Stated Goals  Travel without pain     Currently in Pain?  Yes    Pain Score  3     Pain Location  Knee    Pain Orientation  Left;Lateral    Pain Descriptors / Indicators  Aching    Pain Type  Chronic pain    Pain Onset  More than a month ago    Pain Frequency  Constant          TREATMENT  Manual Palpation of L TFL, peroneus longus, biceps femoris, anterior tibialis, vastus lateralis; STM to L TFL with trigger point release; STM with trigger point release to L biceps femoris;  Ther-Ex NuStep L3 x 5 minutes during history (3 minutes unbilled); Bridge 2 x 10 with verbal cues for proper form; Prone eccentric HS curls 2 x 10; Review of HEP with patient, education about importance of strengthening and stretching;  Trigger Point Dry Needling (TDN) TDN to L biceps femoris, 50mm .25needles placed 2 separate times into R biceps femoriswith one local twitch response (LTR) with the patient positioned inprone. 2 separate placements into R peroneus longs with one  LTR. Patient was educated on risks and benefits of therapy and verbally consents to TDN Dustin Flock)                      PT Education - 07/30/17 1633    Education provided  Yes    Education Details  exercise form/technique    Person(s) Educated  Patient    Methods  Explanation    Comprehension  Verbalized understanding       PT Short Term Goals - 06/26/17 1659      PT SHORT TERM GOAL #1   Title  Pt will be independent with HEP in order to improve strength and balance in order to decrease fall risk and improve function at home and work.    Time  4    Period  Weeks    Status  New        PT Long Term Goals - 06/26/17 1700      PT LONG TERM GOAL #1   Title  Patient will increase FOTO score to 66 to demonstrate predicted increase in  functional mobility to complete ADLs    Baseline  06/26/17: 49    Time  8    Period  Weeks    Status  New      PT LONG TERM GOAL #2   Title  Pt will decrease worst pain as reported on NPRS by at least 3 points in order to demonstrate clinically significant reduction in pain.    Baseline  5/21: worst pain 7/10    Time  8    Period  Weeks    Status  New      PT LONG TERM GOAL #3   Title  Patient will report pain not exceeding 2/10 while driving an hour in order to complete his work commutes and visit his daugher without pain    Baseline  5/21: pain up to 7/10 with travel    Time  8    Period  Weeks            Plan - 07/30/17 1633    Clinical Impression Statement  Pt reports significant reduction in his pain at the end of session. He is able to complete eccentric HS curls with only minimal increase in pain. Pt encouraged to continue HEP and follow-up as scheduled.     Rehab Potential  Good    Clinical Impairments Affecting Rehab Potential  (+) Motivation, active lifestyle, social support (-) Age, multiple pain sites, chronicity of pain    PT Frequency  2x / week    PT Duration  8 weeks    PT Treatment/Interventions  Aquatic Therapy;Traction;Moist Heat;ADLs/Self Care Home Management;Electrical Stimulation;Cryotherapy;Therapeutic activities;Functional mobility training;Neuromuscular re-education;Taping;Passive range of motion;Manual techniques;Patient/family education;Therapeutic exercise;Gait training;Stair training;Balance training;Dry needling    PT Next Visit Plan  soft tissue techniques/modalities for L LE tightness, squat mechanics review and education    PT Home Exercise Plan  Seated piriformis stretch, seated hamstring stretch, supine thomas stretch    Consulted and Agree with Plan of Care  Patient       Patient will benefit from skilled therapeutic intervention in order to improve the following deficits and impairments:  Decreased mobility, Difficulty walking, Increased  muscle spasms, Improper body mechanics, Impaired tone, Decreased range of motion, Decreased strength, Increased fascial restricitons, Impaired flexibility, Pain  Visit Diagnosis: Pain in left hip  Acute pain of left knee     Problem List Patient Active Problem List   Diagnosis  Date Noted  . Right inguinal hernia 03/20/2014    Gabriel Butler 07/30/2017, 5:32 PM  [facility] PHYS[facility] SPORTS MEDICINE [address] [place], [address] Phone: [phone]   Fax:  [phone]  Name: Gabriel Butler MRN: [id number] Date of Birth: 1950-08-14

## 2017-08-02 ENCOUNTER — Ambulatory Visit: Payer: BLUE CROSS/BLUE SHIELD | Admitting: Physical Therapy

## 2017-08-02 ENCOUNTER — Encounter: Payer: Self-pay | Admitting: Physical Therapy

## 2017-08-02 DIAGNOSIS — M79605 Pain in left leg: Secondary | ICD-10-CM

## 2017-08-02 DIAGNOSIS — M25562 Pain in left knee: Secondary | ICD-10-CM

## 2017-08-02 DIAGNOSIS — M25552 Pain in left hip: Secondary | ICD-10-CM

## 2017-08-02 NOTE — Therapy (Signed)
[facility] [facility] PHYS[facility] SPORTS MEDICINE [address] [place], [address] Phone: [phone]   Fax:  [phone]  Physical Therapy Treatment  Patient Details  Name: Gabriel Butler MRN: [id number] Date of Birth: 1950-09-21 Referring Provider: [name] MD   Encounter Date: 08/02/2017  PT End of Session - 08/02/17 1730    Visit Number  9    Number of Visits  17    Date for PT Re-Evaluation  08/21/17    PT Start Time  0525    PT Stop Time  0605    PT Time Calculation (min)  40 min    Activity Tolerance  Patient tolerated treatment well    Behavior During Therapy  [facility] for tasks assessed/performed       Past Medical History:  Diagnosis Date  . GERD (gastroesophageal reflux disease)   . Heart attack (HCC) 03/05/2013   Coronary stent placement.  . Hypertension     Past Surgical History:  Procedure Laterality Date  . COLONOSCOPY  2013  . CORONARY ANGIOPLASTY WITH STENT PLACEMENT    . HERNIA REPAIR Right 03/26/14   Direct and indirect hernia noted, large ultra Pro mesh utilized.    There were no vitals filed for this visit.  Subjective Assessment - 08/02/17 1729    Subjective  Patient reports 3/10 pain today in the lateral posterior L leg. Patient reports diligence with his HEP. No questions or concerns at this time.     Pertinent History  Pt is a 67 year old male referred for L IT band syndrome. Patient reports pain in the L hip beginning 2 months ago that has progressed down the lateral aspect of the L leg into the front of the shin. Patient reports pain with descending stairs, prolonged walking, and prolonged driving/traveling (daughter lives in [place]). Patient reports he started meloxicam a couple days ago and that this has helping his pain. Worst pain in past week: 7/10 best: 2/10    Limitations  Sitting;Lifting;House hold activities;Standing;Walking    How long can you sit comfortably?     How long can you stand  comfortably?     How long can you walk comfortably?     Diagnostic tests  None    Patient Stated Goals  Travel without pain     Pain Onset  More than a month ago         Manual -STM with trigger point release to L biceps femoris and L gastroc -STM w/trigger point releaseto Lbicepsfemoris, and R ant tib near distal muscle bellyas this is patient's chiefpain complaint with muscle spasms andtrigger pointsthat resolve after increased time spent here.Following manual techniques: Dry needling(3) 60mm .30needles placed alongR biceps femoriswith the patient positioned inprone. Patient was educated on risks and benefits of therapy and verbally consents to PT.Following, (3) 30mm .30needles placed alongR ant tib    Ther-Ex -Eccentric hamstring with manual resistance from PT for eccentric knee ext in prone 3x 10  -Prone hip ext with knee bend 3x 10 with min cuing to prevent pelvic rotation compensation -MATRIX hip ext 2x 10 55# 1x 10 70# with max TC and VC for proper posture without trunk flexion compensation -Education to increase stretching duration to 45-60sec for long term benefits of long duration low load stretching                        PT Education - 08/02/17 1730  Education provided  Yes    Education Details  exercise form    Person(s) Educated  Patient    Methods  Explanation;Tactile cues;Verbal cues    Comprehension  Verbal cues required;Returned demonstration;Tactile cues required       PT Short Term Goals - 06/26/17 1659      PT SHORT TERM GOAL #1   Title  Pt will be independent with HEP in order to improve strength and balance in order to decrease fall risk and improve function at home and work.    Time  4    Period  Weeks    Status  New        PT Long Term Goals - 06/26/17 1700      PT LONG TERM GOAL #1   Title  Patient will increase FOTO score to 66 to demonstrate predicted increase in functional mobility to  complete ADLs    Baseline  06/26/17: 49    Time  8    Period  Weeks    Status  New      PT LONG TERM GOAL #2   Title  Pt will decrease worst pain as reported on NPRS by at least 3 points in order to demonstrate clinically significant reduction in pain.    Baseline  5/21: worst pain 7/10    Time  8    Period  Weeks    Status  New      PT LONG TERM GOAL #3   Title  Patient will report pain not exceeding 2/10 while driving an hour in order to complete his work commutes and visit his daugher without pain    Baseline  5/21: pain up to 7/10 with travel    Time  8    Period  Weeks            Plan - 08/02/17 1809    Clinical Impression Statement  Patient is continuing to note pain reduction with manual + DN techniques. PT is continuing to cue patient for glute contraction with therex as patient has difficulty activating his hip ext, but is able to correct 75% with PT cuing. PT educated patient to increase stretching duration to 45-60sec to increase long term benefits.     Rehab Potential  Good    Clinical Impairments Affecting Rehab Potential  (+) Motivation, active lifestyle, social support (-) Age, multiple pain sites, chronicity of pain    PT Frequency  2x / week    PT Duration  8 weeks    PT Next Visit Plan  soft tissue techniques/modalities for L LE tightness, squat mechanics review and education    PT Home Exercise Plan  Seated piriformis stretch, seated hamstring stretch, supine thomas stretch    Consulted and Agree with Plan of Care  Patient       Patient will benefit from skilled therapeutic intervention in order to improve the following deficits and impairments:  Decreased mobility, Difficulty walking, Increased muscle spasms, Improper body mechanics, Impaired tone, Decreased range of motion, Decreased strength, Increased fascial restricitons, Impaired flexibility, Pain  Visit Diagnosis: Pain in left leg  Pain in left hip  Acute pain of left knee     Problem  List Patient Active Problem List   Diagnosis Date Noted  . Right inguinal hernia 03/20/2014   Staci Acosta PT, DPT Staci Acosta 08/02/2017, 6:11 PM  [facility] PHYSICAL AND SPORTS MEDICINE [address], [place], [address] Phone: [phone]   Fax:  [phone]  Name: Gabriel Butler MRN: [id number] Date of Birth: 04-Jun-1950

## 2017-08-06 ENCOUNTER — Ambulatory Visit: Payer: BLUE CROSS/BLUE SHIELD | Admitting: Physical Therapy

## 2017-08-08 ENCOUNTER — Ambulatory Visit: Payer: BLUE CROSS/BLUE SHIELD | Admitting: Physical Therapy

## 2017-08-13 ENCOUNTER — Encounter: Payer: Self-pay | Admitting: Physical Therapy

## 2017-08-13 ENCOUNTER — Ambulatory Visit: Payer: BLUE CROSS/BLUE SHIELD | Attending: Family Medicine | Admitting: Physical Therapy

## 2017-08-13 DIAGNOSIS — M25552 Pain in left hip: Secondary | ICD-10-CM | POA: Insufficient documentation

## 2017-08-13 NOTE — Therapy (Signed)
[place] [facility] [address] [place], [address] Phone: [phone]   Fax:  [phone]  Physical Therapy Treatment  Patient Details  Name: Gabriel Butler MRN: [id number] Date of Birth: 1950-10-23 Referring Provider: [name] MD   Encounter Date: 08/13/2017  PT End of Session - 08/13/17 1624    Visit Number  10    Number of Visits  17    Date for PT Re-Evaluation  08/21/17    PT Start Time  0415    PT Stop Time  0500    PT Time Calculation (min)  45 min    Activity Tolerance  Patient tolerated treatment well    Behavior During Therapy  [facility] for tasks assessed/performed       Past Medical History:  Diagnosis Date  . GERD (gastroesophageal reflux disease)   . Heart attack (HCC) 03/05/2013   Coronary stent placement.  . Hypertension     Past Surgical History:  Procedure Laterality Date  . COLONOSCOPY  2013  . CORONARY ANGIOPLASTY WITH STENT PLACEMENT    . HERNIA REPAIR Right 03/26/14   Direct and indirect hernia noted, large ultra Pro mesh utilized.    There were no vitals filed for this visit.  Subjective Assessment - 08/13/17 1619    Subjective  Patient reports increased tightness in hamstring today following "lots of walking" over vacation in [place] with family over the long holiday weekend. Patient reports only 3/10 pain, but increased tightness today. Patient reports compliance with HEP with no questions or concerns.     Pertinent History  Pt is a 67 year old male referred for L IT band syndrome. Patient reports pain in the L hip beginning 2 months ago that has progressed down the lateral aspect of the L leg into the front of the shin. Patient reports pain with descending stairs, prolonged walking, and prolonged driving/traveling (daughter lives in [place]). Patient reports he started meloxicam a couple days ago and that this has helping his pain. Worst pain in past week: 7/10 best: 2/10    Limitations  Sitting;Lifting;House hold activities;Standing;Walking    How long can you sit comfortably?     How long can you stand comfortably?     How long can you walk comfortably?     Diagnostic tests  None    Patient Stated Goals  Travel without pain     Pain Onset  More than a month ago         Manual -STM withtrigger point releaseto L biceps femoris and L gastroc (prolonged time spent at L bicep femoris w/ increased trigger points from prior sessions) -STM w/trigger point releaseto Lbicepsfemoris, and R ant tibnear distal muscle bellyas this is patient's chiefpain complaint with muscle spasms andtrigger pointsthat resolve after increased time spent here.Following manual techniques: Dry needling(4) 60mm .30needles placed alongR biceps femoriswith the patient positioned inprone. Patient was educated on risks and benefits of therapy and verbally consents to PT.   Ther-Ex -Eccentric hamstring with manual resistance from PT for eccentric knee ext in prone 3x 10  -Bridge exercise 3x 10 with glute set prior with patient reporting glute activation and no hamstring cramping -Mini squat exercise from elevated mat table for tactile cuing for proper ROM with squat; min cuing for foot placement and glute contraction with standing phase 3x 10 with patient reporting no hamstring cramping and that he "felt" exercise in his glutes  PT Education - 08/13/17 1633    Education provided  Yes    Education Details  utilizing heat for muscle tension post increased activity    Person(s) Educated  Patient    Methods  Explanation    Comprehension  Verbalized understanding       PT Short Term Goals - 06/26/17 1659      PT SHORT TERM GOAL #1   Title  Pt will be independent with HEP in order to improve strength and balance in order to decrease fall risk and improve function at home and work.    Time  4    Period  Weeks     Status  New        PT Long Term Goals - 06/26/17 1700      PT LONG TERM GOAL #1   Title  Patient will increase FOTO score to 66 to demonstrate predicted increase in functional mobility to complete ADLs    Baseline  06/26/17: 49    Time  8    Period  Weeks    Status  New      PT LONG TERM GOAL #2   Title  Pt will decrease worst pain as reported on NPRS by at least 3 points in order to demonstrate clinically significant reduction in pain.    Baseline  5/21: worst pain 7/10    Time  8    Period  Weeks    Status  New      PT LONG TERM GOAL #3   Title  Patient will report pain not exceeding 2/10 while driving an hour in order to complete his work commutes and visit his daugher without pain    Baseline  5/21: pain up to 7/10 with travel    Time  8    Period  Weeks            Plan - 08/13/17 1930    Clinical Impression Statement  Patient with increased trigger points following prolonged walking and playing with grandchildren over vacation with minimal HEP compliance. Following manual + TDN patient reports pain relief with noted decreased soft tissue restriction following. Patient is able to complete therex and progression with glute activation and minimal hamstring activation and no noted cramping. PT and patient are pleased with this progress.     Rehab Potential  Good    Clinical Impairments Affecting Rehab Potential  (+) Motivation, active lifestyle, social support (-) Age, multiple pain sites, chronicity of pain    PT Frequency  2x / week    PT Treatment/Interventions  Aquatic Therapy;Traction;Moist Heat;ADLs/Self Care Home Management;Electrical Stimulation;Cryotherapy;Therapeutic activities;Functional mobility training;Neuromuscular re-education;Taping;Passive range of motion;Manual techniques;Patient/family education;Therapeutic exercise;Gait training;Stair training;Balance training;Dry needling    PT Next Visit Plan  soft tissue techniques/modalities for L LE tightness, squat  mechanics review and education    PT Home Exercise Plan  Seated piriformis stretch, seated hamstring stretch, supine thomas stretch    Consulted and Agree with Plan of Care  Patient       Patient will benefit from skilled therapeutic intervention in order to improve the following deficits and impairments:  Decreased mobility, Difficulty walking, Increased muscle spasms, Improper body mechanics, Impaired tone, Decreased range of motion, Decreased strength, Increased fascial restricitons, Impaired flexibility, Pain  Visit Diagnosis: Pain in left hip     Problem List Patient Active Problem List   Diagnosis Date Noted  . Right inguinal hernia 03/20/2014   Staci Acosta PT, DPT Staci Acosta 08/13/2017, 7:34 PM  [place] [facility] [address] [place], [address] Phone: [phone]   Fax:  [phone]  Name: Shyon Slagter MRN: [id number] Date of Birth: 14-Apr-1950

## 2017-08-16 ENCOUNTER — Encounter: Payer: BLUE CROSS/BLUE SHIELD | Admitting: Physical Therapy

## 2017-08-20 ENCOUNTER — Ambulatory Visit: Payer: BLUE CROSS/BLUE SHIELD | Admitting: Physical Therapy

## 2017-08-21 ENCOUNTER — Encounter: Payer: Self-pay | Admitting: Physical Therapy

## 2017-08-21 ENCOUNTER — Ambulatory Visit: Payer: BLUE CROSS/BLUE SHIELD | Admitting: Physical Therapy

## 2017-08-21 DIAGNOSIS — M25552 Pain in left hip: Secondary | ICD-10-CM

## 2017-08-21 NOTE — Therapy (Signed)
[place] [facility] [address] [place], [address] Phone: [phone]   Fax:  [phone]  Physical Therapy Treatment/ D/C summary  Patient Details  Name: Gabriel Butler MRN: [id number] Date of Birth: 09-07-1950 Referring Provider: [name] MD   Encounter Date: 08/21/2017  PT End of Session - 08/21/17 1812    Visit Number  11    Number of Visits  17    Date for PT Re-Evaluation  08/21/17    PT Start Time  0538    PT Stop Time  0617    PT Time Calculation (min)  39 min    Activity Tolerance  Patient tolerated treatment well    Behavior During Therapy  [facility] for tasks assessed/performed       Past Medical History:  Diagnosis Date  . GERD (gastroesophageal reflux disease)   . Heart attack (HCC) 03/05/2013   Coronary stent placement.  . Hypertension     Past Surgical History:  Procedure Laterality Date  . COLONOSCOPY  2013  . CORONARY ANGIOPLASTY WITH STENT PLACEMENT    . HERNIA REPAIR Right 03/26/14   Direct and indirect hernia noted, large ultra Pro mesh utilized.    There were no vitals filed for this visit.  Subjective Assessment - 08/21/17 1743    Subjective  Patient reports that [place] this weekend with his grandchildren and reports he only had 1/10 pain. Patient reports that he believes he is able to complete HEP on his own at this point, and reports he thinks "strengthening the glutes consistently has helped a lot".     Pertinent History  Pt is a 67 year old male referred for L IT band syndrome. Patient reports pain in the L hip beginning 2 months ago that has progressed down the lateral aspect of the L leg into the front of the shin. Patient reports pain with descending stairs, prolonged walking, and prolonged driving/traveling (daughter lives in [place]). Patient reports he started meloxicam a couple days ago and that this has helping his pain. Worst pain in past week: 7/10 best:  2/10    Limitations  Sitting;Lifting;House hold activities;Standing;Walking    How long can you sit comfortably?     How long can you stand comfortably?     How long can you walk comfortably?     Diagnostic tests  None    Patient Stated Goals  Travel without pain     Pain Onset  More than a month ago          Ther-Ex PT and patient required HEP stretching (hamstring, Thomas, glute stretch) with review of frequency/duration. PT and patient went through following HEP for glute strengthening with education of frequency (3-4x/week) and strengthening reps/sets (3-5 sets of 5-10 reps). PT and patient discussed patient's aerobic activity, and PT educated patient to continue bike or elliptical trainer 30-16min 3-4x/week -Mini squats x10 BW and x10 with yellow band at knees for glute med activation; min cuing for proper form -Standing hip abd with red tband x10 each side with min cuing to prevent lateral trunk lean compensation -Standing hip ext with red tband x10 each side with min cuing to prevent forward trunk lean compensation -Bridge exercise with yellow tband at knees x 10 with min cuing for glute contraction -Prone hip ext with knee band x10 with reminder to complete glute set if hamstring cramp -Clamshell with yellow tband 10x each wide with min cuing  to prevent hip rotation compensation                     PT Education - 08/21/17 1745    Education provided  Yes    Education Details  D/C reccommendations    Person(s) Educated  Patient    Methods  Explanation;Verbal cues;Demonstration;Handout    Comprehension  Verbalized understanding;Returned demonstration;Verbal cues required       PT Short Term Goals - 08/21/17 1746      PT SHORT TERM GOAL #1   Title  Pt will be independent with HEP in order to improve strength and balance in order to decrease fall risk and improve function at home and work.    Time  4    Period  Weeks    Status  Achieved         PT Long Term Goals - 08/21/17 1746      PT LONG TERM GOAL #1   Title  Patient will increase FOTO score to 66 to demonstrate predicted increase in functional mobility to complete ADLs    Baseline  08/21/17 75    Time  8    Period  Weeks    Status  Achieved      PT LONG TERM GOAL #2   Title  Pt will decrease worst pain as reported on NPRS by at least 3 points in order to demonstrate clinically significant reduction in pain.    Baseline  08/21/17 worst 1/10    Time  8    Period  Weeks    Status  Achieved      PT LONG TERM GOAL #3   Title  Patient will report pain not exceeding 2/10 while driving an hour in order to complete his work commutes and visit his daugher without pain    Baseline  7/16: 1/10 with travel    Time  8    Period  Weeks    Status  Achieved              Patient will benefit from skilled therapeutic intervention in order to improve the following deficits and impairments:     Visit Diagnosis: Pain in left hip     Problem List Patient Active Problem List   Diagnosis Date Noted  . Right inguinal hernia 03/20/2014   Staci Acosta PT, DPT Staci Acosta 08/21/2017, 6:15 PM  [place] [facility] [address], [place], [address] Phone: [phone]   Fax:  [phone]  Name: Gabriel Butler MRN: [id number] Date of Birth: [birth year]

## 2017-08-23 ENCOUNTER — Ambulatory Visit: Payer: BLUE CROSS/BLUE SHIELD | Admitting: Physical Therapy

## 2017-08-29 ENCOUNTER — Ambulatory Visit: Payer: BLUE CROSS/BLUE SHIELD | Admitting: Physical Therapy

## 2017-09-05 ENCOUNTER — Encounter: Payer: BLUE CROSS/BLUE SHIELD | Admitting: Physical Therapy

## 2018-02-08 ENCOUNTER — Other Ambulatory Visit: Payer: Self-pay | Admitting: Physician Assistant

## 2018-04-06 ENCOUNTER — Other Ambulatory Visit: Payer: Self-pay | Admitting: Physician Assistant

## 2018-04-10 ENCOUNTER — Other Ambulatory Visit: Payer: Self-pay

## 2018-04-10 ENCOUNTER — Other Ambulatory Visit: Payer: Self-pay | Admitting: Physician Assistant

## 2018-04-11 LAB — CMP12+LP+TP+TSH+6AC+CBC/D/PLT
ALBUMIN: 4.3 g/dL (ref 3.8–4.8)
ALK PHOS: 69 IU/L (ref 39–117)
ALT: 33 IU/L (ref 0–44)
AST: 26 IU/L (ref 0–40)
Albumin/Globulin Ratio: 2 (ref 1.2–2.2)
BASOS ABS: 0 10*3/uL (ref 0.0–0.2)
BUN/Creatinine Ratio: 17 (ref 10–24)
BUN: 17 mg/dL (ref 8–27)
Basos: 1 %
Bilirubin Total: 0.5 mg/dL (ref 0.0–1.2)
CHOLESTEROL TOTAL: 113 mg/dL (ref 100–199)
Calcium: 9.7 mg/dL (ref 8.6–10.2)
Chloride: 102 mmol/L (ref 96–106)
Chol/HDL Ratio: 2.8 ratio (ref 0.0–5.0)
Creatinine, Ser: 0.99 mg/dL (ref 0.76–1.27)
EOS (ABSOLUTE): 0.1 10*3/uL (ref 0.0–0.4)
Eos: 2 %
FREE THYROXINE INDEX: 1.1 — AB (ref 1.2–4.9)
GFR calc Af Amer: 91 mL/min/{1.73_m2} (ref >59)
GFR, EST NON AFRICAN AMERICAN: 79 mL/min/{1.73_m2} (ref >59)
GGT: 22 IU/L (ref 0–65)
GLOBULIN, TOTAL: 2.2 g/dL (ref 1.5–4.5)
Glucose: 112 mg/dL — ABNORMAL HIGH (ref 65–99)
HDL: 40 mg/dL (ref >39)
HEMATOCRIT: 44.4 % (ref 37.5–51.0)
Hemoglobin: 15.3 g/dL (ref 13.0–17.7)
IMMATURE GRANULOCYTES: 0 %
IRON: 90 ug/dL (ref 38–169)
Immature Grans (Abs): 0 10*3/uL (ref 0.0–0.1)
LDH: 190 IU/L (ref 121–224)
LDL CALC: 59 mg/dL (ref 0–99)
LYMPHS ABS: 1.4 10*3/uL (ref 0.7–3.1)
Lymphs: 25 %
MCH: 30.2 pg (ref 26.6–33.0)
MCHC: 34.5 g/dL (ref 31.5–35.7)
MCV: 88 fL (ref 79–97)
MONOS ABS: 0.6 10*3/uL (ref 0.1–0.9)
Monocytes: 11 %
NEUTROS PCT: 61 %
Neutrophils Absolute: 3.4 10*3/uL (ref 1.4–7.0)
PLATELETS: 204 10*3/uL (ref 150–450)
Phosphorus: 3.4 mg/dL (ref 2.8–4.1)
Potassium: 4.6 mmol/L (ref 3.5–5.2)
RBC: 5.06 x10E6/uL (ref 4.14–5.80)
RDW: 12.3 % (ref 11.6–15.4)
Sodium: 139 mmol/L (ref 134–144)
T3 UPTAKE RATIO: 21 % — AB (ref 24–39)
T4, Total: 5.4 ug/dL (ref 4.5–12.0)
TOTAL PROTEIN: 6.5 g/dL (ref 6.0–8.5)
TRIGLYCERIDES: 71 mg/dL (ref 0–149)
TSH: 1.45 u[IU]/mL (ref 0.450–4.500)
Uric Acid: 5.6 mg/dL (ref 3.7–8.6)
VLDL Cholesterol Cal: 14 mg/dL (ref 5–40)
WBC: 5.5 10*3/uL (ref 3.4–10.8)

## 2018-04-30 LAB — HGB A1C W/O EAG: HEMOGLOBIN A1C: 6.5 % — AB (ref 4.8–5.6)

## 2018-04-30 LAB — SPECIMEN STATUS REPORT

## 2018-07-29 ENCOUNTER — Telehealth: Payer: Self-pay

## 2018-07-29 NOTE — Telephone Encounter (Signed)
  1. Do you have a fever? no 2. Are you having chills? no 3. Do you have a sore throat? no 4. Are you experiencing resp S/Sx -cough, SOB? Some coughing  5. Do you have muscle aches? no 6. Are you experiencing N/V/D? no 7. Experiencing loss of sense of taste and/or smell? no 8. Do you have a headache? Yes  9. Have you had contact with a person confirmed Positive for Covid-19? no 10. Have you traveled outside of [place]? no   Spoke with pt he wanted to know if he can be seen for "allergy sx" or if he can have MD call him he complains of a headache and cough, denies any other sx. States he has these sx this time of year. Advised pt I would discuss with MD and someone will call him back

## 2018-07-29 NOTE — Telephone Encounter (Signed)
Called patient. Noted had his "normal allergy and sinus deal" this time of year.  Has a HA, not a lot, mild, no cough. No fevers, takes temp daily.  + PND, clear. eyes watering when outside. Not severe sx's, just trying to make sure doing what can to help Using zyrtec and flonase product  Rec'ed - continue the flonase and zyrtec daily and add zaditor eye drops for the eyes once to twice daily and can use Benadryl late in the evening prn as well (makes drowsy if use during day). Not feel Covid concern and he agreed and if more concerning sx's arise or allergy sx's worsening, can f/u (sometimes will add a singulair product).

## 2018-08-02 ENCOUNTER — Other Ambulatory Visit: Payer: Self-pay

## 2018-08-02 DIAGNOSIS — Z9109 Other allergy status, other than to drugs and biological substances: Secondary | ICD-10-CM

## 2018-08-02 MED ORDER — MONTELUKAST SODIUM 10 MG PO TABS: 10.0000 mg | ORAL_TABLET | Freq: Every day | ORAL | 1 refills | Status: DC

## 2018-08-27 ENCOUNTER — Other Ambulatory Visit: Payer: Self-pay | Admitting: Internal Medicine

## 2018-09-02 ENCOUNTER — Other Ambulatory Visit: Payer: Self-pay | Admitting: Internal Medicine

## 2018-09-03 ENCOUNTER — Other Ambulatory Visit: Payer: Self-pay | Admitting: Internal Medicine

## 2018-10-04 ENCOUNTER — Telehealth: Payer: Self-pay

## 2018-10-04 NOTE — Telephone Encounter (Signed)
Contacted [facility] [phone]) 10/03/2018 at 3:30pm & spoke with Learta Codding about prior authorization for pantoprazole sodium 40 mg tablets for quantity of 60 tablets per month.  Approved for 36 months - 10/03/18 - 10/02/21  AMD

## 2018-10-16 ENCOUNTER — Other Ambulatory Visit: Payer: Self-pay | Admitting: Internal Medicine

## 2018-10-18 DIAGNOSIS — J0141 Acute recurrent pansinusitis: Secondary | ICD-10-CM | POA: Diagnosis not present

## 2018-11-04 ENCOUNTER — Other Ambulatory Visit: Payer: Self-pay

## 2018-11-04 MED ORDER — SERTRALINE HCL 50 MG PO TABS: 50.0000 mg | ORAL_TABLET | Freq: Every day | ORAL | 0 refills | Status: DC

## 2018-11-04 NOTE — Telephone Encounter (Signed)
Labs completed 04/10/2018 and office visit 04/16/2018 with Dr Roxan Hockey.  Bridge refill 90 days and patient needs follow up appt for next fill.  Patient has been well controlled on this dose since 2018 per paper chart review [place] paper chart.

## 2018-11-05 ENCOUNTER — Other Ambulatory Visit: Payer: Self-pay | Admitting: Internal Medicine

## 2018-11-21 DIAGNOSIS — L820 Inflamed seborrheic keratosis: Secondary | ICD-10-CM | POA: Diagnosis not present

## 2018-11-21 DIAGNOSIS — D485 Neoplasm of uncertain behavior of skin: Secondary | ICD-10-CM | POA: Diagnosis not present

## 2018-11-21 DIAGNOSIS — L814 Other melanin hyperpigmentation: Secondary | ICD-10-CM | POA: Diagnosis not present

## 2018-11-21 DIAGNOSIS — L570 Actinic keratosis: Secondary | ICD-10-CM | POA: Diagnosis not present

## 2018-12-04 ENCOUNTER — Other Ambulatory Visit: Payer: Self-pay

## 2018-12-04 DIAGNOSIS — F419 Anxiety disorder, unspecified: Secondary | ICD-10-CM

## 2018-12-04 NOTE — Telephone Encounter (Signed)
Last labs Mar 2020 stable.   I last saw patient Sep 2019 for physical.  Last saw Dr Roxan Hockey Mar 2020  17 Rx in previous two years from [place] providers reviewed PMP [place] website.  Last fill 12/02/2018.  Patient due for face to face visit as required every 3 months.

## 2018-12-10 ENCOUNTER — Ambulatory Visit: Payer: Self-pay

## 2018-12-11 ENCOUNTER — Ambulatory Visit: Payer: Self-pay | Admitting: Registered Nurse

## 2018-12-11 ENCOUNTER — Other Ambulatory Visit: Payer: Self-pay

## 2018-12-11 VITALS — BP 104/72 | HR 74 | Temp 98.7°F | Resp 12 | Ht 75.5 in | Wt 210.0 lb

## 2018-12-11 DIAGNOSIS — S40019A Contusion of unspecified shoulder, initial encounter: Secondary | ICD-10-CM | POA: Insufficient documentation

## 2018-12-11 DIAGNOSIS — Z6825 Body mass index (BMI) 25.0-25.9, adult: Secondary | ICD-10-CM

## 2018-12-11 DIAGNOSIS — F411 Generalized anxiety disorder: Secondary | ICD-10-CM

## 2018-12-11 DIAGNOSIS — M719 Bursopathy, unspecified: Secondary | ICD-10-CM | POA: Insufficient documentation

## 2018-12-11 MED ORDER — ALPRAZOLAM 0.5 MG PO TABS: 0.5000 mg | ORAL_TABLET | Freq: Three times a day (TID) | ORAL | 0 refills | Status: DC | PRN

## 2018-12-11 NOTE — Telephone Encounter (Signed)
See office visit note 12/11/2018.  Rx approved for 90 day supply.  Patient reporting he typically uses one tab BID four days per week and once a day the other days of the week.  Trazadone one tab per day.  Anxiety still high due to covid/wife watching grandchildren in [place] now.  Working from home increased his anxiety.  Walking the dog to combat anxiety--he notices he sleeps better.  Denied HI/SI.  Continue working to decrease dose of xanax/trazadone but difficult during these covid/presidential election year/rioting in [place] times.

## 2018-12-11 NOTE — Progress Notes (Signed)
Subjective:    Patient ID: Gabriel Butler, male    DOB: 07/30/1950, 68 y.o.   MRN: [id number]  68y/o caucasian married male established patient last seen by me a year ago.  Last office visit with Dr Dorris FetchHendrickson worried about polypharmacy.  See tcon dated 12/04/2018 patient was pending appt for xanax refill  Rx approved for 90 day supply RF0.  Patient reporting he typically uses one xanax tab BID four days per week and once a day the other days of the week.  Trazadone one tab per day.  Anxiety still high due to covid/wife watching grandchildren in [place] now.  Working from home increased his anxiety.  Not traveling for work or routine ADLS.  All grocery shopping online due to health risks.   Walking the dog to combat anxiety--he notices he sleeps better. Averages 6-7 hours per night.  Goal to get 10,000 steps per day using iphone.  App shows on average 10,000+ steps per day since Mar 2020.  8900 steps per day average for previous 12 months.   Denied HI/SI.  Continue working to decrease dose of xanax/trazadone but difficult during these covid/presidential election year/rioting in [place] times.  Patient bought elliptical trainer and put in his garage to use this winter as his [facility] doctor and daughter do not want him going to the gym.  Asking if he should receive shingles vaccine. Reported he had pneumococcal last month at [facility].  Spouse has had shingles outbreak in the past.  Last fasting labs Mar 2020. 1 month ago problems with allergic rhinitis but that has cleared up per patient.  Plantar fasciitis occasionally flares; first steps in am worst.  Wearing his favorite tennis shoes helps along with thorlo socks.  Denied walking barefoot in house.  Patient unsure of his medications and doses for medication reconciliation and is going to get his list from the car and return to review with RN Paschal Doppobbins after provider portion of visit.  [facility].     Review of  Systems  Constitutional: Negative for activity change, appetite change, chills, diaphoresis, fatigue, fever and unexpected weight change.  HENT: Negative for postnasal drip, sinus pressure, sinus pain, trouble swallowing and voice change.   Eyes: Negative for photophobia and visual disturbance.  Respiratory: Negative for cough, choking, shortness of breath, wheezing and stridor.   Cardiovascular: Negative for chest pain, palpitations and leg swelling.  Gastrointestinal: Negative for abdominal pain, diarrhea, nausea and vomiting.  Endocrine: Negative for cold intolerance and heat intolerance.  Genitourinary: Negative for difficulty urinating.  Musculoskeletal: Positive for myalgias. Negative for gait problem, neck pain and neck stiffness.  Skin: Negative for color change, pallor, rash and wound.  Allergic/Immunologic: Positive for environmental allergies.  Neurological: Negative for dizziness, tremors, seizures, syncope, speech difficulty, weakness, light-headedness and headaches.  Hematological: Negative for adenopathy. Does not bruise/bleed easily.  Psychiatric/Behavioral: Negative for agitation, confusion, sleep disturbance and suicidal ideas.       Objective:   Physical Exam Vitals signs and nursing note reviewed.  Constitutional:      General: He is awake. He is not in acute distress.    Appearance: Normal appearance. He is well-developed, well-groomed and overweight. He is not ill-appearing, toxic-appearing or diaphoretic.  HENT:     Head: Normocephalic and atraumatic.     Jaw: There is normal jaw occlusion. No trismus.     Salivary Glands: Right salivary gland is not diffusely enlarged or tender. Left salivary gland  is not diffusely enlarged or tender.     Right Ear: Hearing, ear canal and external ear normal. A middle ear effusion is present.     Left Ear: Hearing, ear canal and external ear normal. A middle ear effusion is present.     Nose: Nose normal. No nasal deformity,  septal deviation, signs of injury, laceration, nasal tenderness, mucosal edema, congestion or rhinorrhea.     Right Sinus: No maxillary sinus tenderness or frontal sinus tenderness.     Left Sinus: No maxillary sinus tenderness or frontal sinus tenderness.     Mouth/Throat:     Lips: Pink. No lesions.     Mouth: Mucous membranes are moist. Mucous membranes are not pale, not dry and not cyanotic. No lacerations, oral lesions or angioedema.     Dentition: Normal dentition. Does not have dentures. No dental caries or dental abscesses.     Tongue: No lesions. Tongue does not deviate from midline.     Palate: No mass and lesions.     Pharynx: Uvula midline. Pharyngeal swelling and posterior oropharyngeal erythema present. No oropharyngeal exudate or uvula swelling.     Tonsils: No tonsillar exudate or tonsillar abscesses.     Comments: Cobblestoning posterior pharynx; bilateral TMs air fluid level clear; bilateral allergic shiners Eyes:     General: Lids are normal. Vision grossly intact. Gaze aligned appropriately. Allergic shiner present. No visual field deficit or scleral icterus.       Right eye: No foreign body, discharge or hordeolum.        Left eye: No foreign body, discharge or hordeolum.     Extraocular Movements: Extraocular movements intact.     Right eye: Normal extraocular motion and no nystagmus.     Left eye: Normal extraocular motion and no nystagmus.     Conjunctiva/sclera: Conjunctivae normal.     Right eye: Right conjunctiva is not injected. No chemosis, exudate or hemorrhage.    Left eye: Left conjunctiva is not injected. No chemosis, exudate or hemorrhage.    Pupils: Pupils are equal, round, and reactive to light. Pupils are equal.     Right eye: Pupil is round and reactive.     Left eye: Pupil is round and reactive.  Neck:     Musculoskeletal: Normal range of motion and neck supple. Normal range of motion. No edema, erythema, neck rigidity, crepitus, injury, pain with  movement, torticollis, spinous process tenderness or muscular tenderness.     Thyroid: No thyroid mass, thyromegaly or thyroid tenderness.     Trachea: Trachea and phonation normal. No tracheal tenderness or tracheal deviation.  Cardiovascular:     Rate and Rhythm: Normal rate and regular rhythm.     Chest Wall: PMI is not displaced.     Pulses: Normal pulses.          Radial pulses are 2+ on the right side and 2+ on the left side.     Heart sounds: Normal heart sounds, S1 normal and S2 normal. No murmur. No friction rub. No gallop.   Pulmonary:     Effort: Pulmonary effort is normal. No respiratory distress.     Breath sounds: Normal breath sounds and air entry. No stridor, decreased air movement or transmitted upper airway sounds. No decreased breath sounds, wheezing, rhonchi or rales.     Comments: Wearing cloth mask due to covid 19 pandemic; no cough observed in exam room; spoke full sentences without difficulty Abdominal:     General: Abdomen is flat. Bowel  sounds are normal. There is no distension.     Palpations: Abdomen is soft. There is no shifting dullness, fluid wave, hepatomegaly, splenomegaly, mass or pulsatile mass.     Tenderness: There is no abdominal tenderness. There is no guarding or rebound. Negative signs include Murphy's sign.     Hernia: No hernia is present. There is no hernia in the umbilical area or ventral area.     Comments: Dull to percussion x 4 quads; normoactive bowel sounds x 4 quads  Musculoskeletal: Normal range of motion.        General: No swelling, tenderness, deformity or signs of injury.     Right shoulder: Normal.     Left shoulder: Normal.     Right elbow: Normal.    Left elbow: Normal.     Right hip: Normal.     Left hip: Normal.     Right knee: Normal.     Left knee: Normal.     Right ankle: Normal.     Left ankle: Normal.     Cervical back: Normal.     Thoracic back: Normal.     Lumbar back: Normal.     Right hand: Normal.     Left hand:  Normal.     Right lower leg: No edema.     Left lower leg: No edema.  Lymphadenopathy:     Head:     Right side of head: No submental, submandibular, tonsillar, preauricular, posterior auricular or occipital adenopathy.     Left side of head: No submental, submandibular, tonsillar, preauricular, posterior auricular or occipital adenopathy.     Cervical: No cervical adenopathy.     Right cervical: No superficial, deep or posterior cervical adenopathy.    Left cervical: No superficial, deep or posterior cervical adenopathy.  Skin:    General: Skin is warm and dry.     Capillary Refill: Capillary refill takes less than 2 seconds.     Coloration: Skin is not ashen, cyanotic, jaundiced, mottled, pale or sallow.     Findings: No abrasion, abscess, acne, bruising, burn, ecchymosis, erythema, signs of injury, laceration, lesion, petechiae, rash or wound.     Nails: There is no clubbing.   Neurological:     General: No focal deficit present.     Mental Status: He is alert and oriented to person, place, and time. Mental status is at baseline.     GCS: GCS eye subscore is 4. GCS verbal subscore is 5. GCS motor subscore is 6.     Cranial Nerves: Cranial nerves are intact. No cranial nerve deficit, dysarthria or facial asymmetry.     Sensory: Sensation is intact. No sensory deficit.     Motor: Motor function is intact. No weakness, tremor, atrophy, abnormal muscle tone or seizure activity.     Coordination: Coordination is intact. Coordination normal.     Gait: Gait is intact. Gait normal.     Comments: Gait sure and steady in hallway; on/off exam table and in/out of chair without difficulty;bilateral hand grasp equal 5/5  Psychiatric:        Attention and Perception: Attention and perception normal.        Mood and Affect: Mood and affect normal.        Speech: Speech normal.        Behavior: Behavior normal. Behavior is cooperative.        Thought Content: Thought content normal.         Cognition and Memory: Cognition and  memory normal.        Judgment: Judgment normal.     Comments: GAD 7 score:  12 patient reported "I have A++ personality; spouse traveling early in am to care for grandchildren in [place], I don't want her walking to car in the dark so I get up early to accompany her to car; working from home during covid more stressful; I don't get to visit worksites; I enjoy visiting with people not the same on computer/phone; trying to be safe due to health history to avoid covid infection; exercise is helping to control some symptoms but he relies on his xanax and trazodone to help with heightened stress days and getting to sleep."           Assessment & Plan:  A-Generalized anxiety, BMI 25.9  P- Patient stated anxiety does not cause problems at work and able to complete all duties. Discussed self-referral to chaplain/priest or [facility] available for him to discuss issues that he worries about. He has found exercise key to his good sleep.  Getting up earlier than normal now averaging 6 hours per night due to wife having to leave early morning helping to care for grandchildren waking him up.   Exercise during the day--continue walking dog and starting elliptical in garage since he is avoiding gym due to covid. Continue goal of 10,000 steps per day.  Discussed sleep hygiene, maintaining routine. No S/I or H/I, plan, or ideation. Patient is reliable. Discussed other resources patient may use if symptoms worsen EMERGENCY ROOM, chaplain, or Urgent Care Center.  Reviewed [place] PMP website.  Patient has been receiving his xanax and trazodone from [facility].  Refilled xanax 0.5mg  po TID prn anxiety #90 RF0 electronically to his pharmacy of choice.  17 Rx in previous 24 months for alprazolam by [facility] providers last Rx 10/2018 filled 11/2018.  Discussed continue to utilize exercise/other stress reducers first and xanax as backup.  I understand this is  difficult during covid pandemic.  Exitcare handout on living with anxiety and insomnia. Return to the  clinic if any new or worsening symptoms.  Face to face visits required every 3 months for controlled substance refills per clinic policy. Patient verbalized understanding  of information/instructions, agreed with plan of care and had no further questions at this time. P2: Diet and Exercise. Stress reduction.   Discussed shingles vaccine is recommended no contraindications except to wait 4 weeks after any steroids and after pneumococcal vaccine which patient reported recently receiving.  Discussed new shingrix vaccine patients report feeling fatigued/fever/body aches and it is a two part vaccination.  Old shingles vaccine typically fewer patient reported side effects if his pharmacy still carrying it could consider getting that one this year for protection as spouse with history of shingles outbreaks.  And then start shingrix next year if he is concerned about symptoms mimicing covid symptoms at this time.  Unsure if [facility] carrying shingles vaccines at this time and will need to clarify with RN Paschal Dopp if any on hand.  Discussed we do have flu vaccinations and tetanus available at this time.  Patient did not want shingles today.  Reviewed up to date vaccine administration with patient today in clinic verbally. exitcare shingles VIS live and recombinant shingles vaccines. Patient verbalized understanding information/instructions and had no further questions at this time.  Patient continuing to work on exercise, diet and weight loss per cardiology to help prevent CAD worsening and have BMI less than  25. Exitcare handout preventing health risks of being overweight

## 2018-12-11 NOTE — Patient Instructions (Signed)
Recombinant Zoster (Shingles) Vaccine: What You Need to Know 1. Why get vaccinated? Recombinant zoster (shingles) vaccine can prevent shingles. Shingles (also called herpes zoster, or just zoster) is a painful skin rash, usually with blisters. In addition to the rash, shingles can cause fever, headache, chills, or upset stomach. More rarely, shingles can lead to pneumonia, hearing problems, blindness, brain inflammation (encephalitis), or death. The most common complication of shingles is long-term nerve pain called postherpetic neuralgia (PHN). PHN occurs in the areas where the shingles rash was, even after the rash clears up. It can last for months or years after the rash goes away. The pain from PHN can be severe and debilitating. About 10 to 18% of people who get shingles will experience PHN. The risk of PHN increases with age. An older adult with shingles is more likely to develop PHN and have longer lasting and more severe pain than a younger person with shingles. Shingles is caused by the varicella zoster virus, the same virus that causes chickenpox. After you have chickenpox, the virus stays in your body and can cause shingles later in life. Shingles cannot be passed from one person to another, but the virus that causes shingles can spread and cause chickenpox in someone who had never had chickenpox or received chickenpox vaccine. 2. Recombinant shingles vaccine Recombinant shingles vaccine provides strong protection against shingles. By preventing shingles, recombinant shingles vaccine also protects against PHN. Recombinant shingles vaccine is the preferred vaccine for the prevention of shingles. However, a different vaccine, live shingles vaccine, may be used in some circumstances. The recombinant shingles vaccine is recommended for adults 50 years and older without serious immune problems. It is given as a two-dose series. This vaccine is also recommended for people who have already gotten  another type of shingles vaccine, the live shingles vaccine. There is no live virus in this vaccine. Shingles vaccine may be given at the same time as other vaccines. 3. Talk with your health care provider Tell your vaccine provider if the person getting the vaccine:  Has had an allergic reaction after a previous dose of recombinant shingles vaccine, or has any severe, life-threatening allergies.  Is pregnant or breastfeeding.  Is currently experiencing an episode of shingles. In some cases, your health care provider may decide to postpone shingles vaccination to a future visit. People with minor illnesses, such as a cold, may be vaccinated. People who are moderately or severely ill should usually wait until they recover before getting recombinant shingles vaccine. Your health care provider can give you more information. 4. Risks of a vaccine reaction  A sore arm with mild or moderate pain is very common after recombinant shingles vaccine, affecting about 80% of vaccinated people. Redness and swelling can also happen at the site of the injection.  Tiredness, muscle pain, headache, shivering, fever, stomach pain, and nausea happen after vaccination in more than half of people who receive recombinant shingles vaccine. In clinical trials, about 1 out of 6 people who got recombinant zoster vaccine experienced side effects that prevented them from doing regular activities. Symptoms usually went away on their own in 2 to 3 days. You should still get the second dose of recombinant zoster vaccine even if you had one of these reactions after the first dose. People sometimes faint after medical procedures, including vaccination. Tell your provider if you feel dizzy or have vision changes or ringing in the ears. As with any medicine, there is a very remote chance of a vaccine causing  a severe allergic reaction, other serious injury, or death. 5. What if there is a serious problem? An allergic reaction  could occur after the vaccinated person leaves the clinic. If you see signs of a severe allergic reaction (hives, swelling of the face and throat, difficulty breathing, a fast heartbeat, dizziness, or weakness), call 9-1-1 and get the person to the nearest hospital. For other signs that concern you, call your health care provider. Adverse reactions should be reported to the Vaccine Adverse Event Reporting System (VAERS). Your health care provider will usually file this report, or you can do it yourself. Visit the VAERS website at www.vaers.LAgents.no or call (938)095-1317. VAERS is only for reporting reactions, and VAERS staff do not give medical advice. 6. How can I learn more?  Ask your health care provider.  Call your local or state health department.  Contact the Centers for Disease Control and Prevention (CDC): ? Call 856-551-3664 (1-800-CDC-INFO) or ? Visit CDC's website at PicCapture.uy Vaccine Information Statement Recombinant Zoster Vaccine (12/05/2017) This information is not intended to replace advice given to you by your health care provider. Make sure you discuss any questions you have with your health care provider. Document Released: 04/04/2016 Document Revised: 05/14/2018 Document Reviewed: 08/29/2017 Elsevier Patient Education  2020 Elsevier Inc. Live Zoster (Shingles) Vaccine: What You Need to Know 1. Why get vaccinated? Live zoster (shingles) vaccine can prevent shingles. Shingles (also called herpes zoster, or just zoster) is a painful skin rash, usually with blisters. In addition to the rash, shingles can cause fever, headache, chills, or upset stomach. More rarely, shingles can lead to pneumonia, hearing problems, blindness, brain inflammation (encephalitis), or death.  The most common complication of shingles is long-term nerve pain called postherpetic neuralgia (PHN). PHN occurs in the areas where the shingles rash was, even after the rash clears up. It can last  for months or years after the rash goes away. The pain from PHN can be severe and debilitating. About 10 to 18% of people who get shingles will experience PHN. The risk of PHN increases with age. An older adult with shingles is more likely to develop PHN and have longer lasting and more severe pain than a younger person with shingles.  Shingles is caused by the varicella zoster virus, the same virus that causes chickenpox. After you have chickenpox, the virus stays in your body and can cause shingles later in life. Shingles cannot be passed from one person to another, but the virus that causes shingles can spread and cause chickenpox in someone who had never had chickenpox or received chickenpox vaccine. 2. Live shingles vaccine Live shingles vaccine can provide protection against shingles and PHN.  Another type of shingles vaccine, recombinant shingles vaccine, is the preferred vaccine for the prevention of shingles. However, live shingles vaccine may be used in some circumstances (for example if a person is allergic to recombinant shingles vaccine or prefers live shingles vaccine, or if recombinant shingles vaccine is not available). Adults 60 years and older who get live shingles vaccine should receive 1 dose, administered by injection. Shingles vaccine may be given at the same time as other vaccines. 3. Talk with your health care provider Tell your vaccine provider if the person getting the vaccine:  Has had an allergic reaction after a previous dose of live shingles vaccine or varicella vaccine, or has any severe, life-threatening allergies.  Has a weakened immune system.  Is pregnant or thinks she might be pregnant.  Is currently experiencing an episode  of shingles. In some cases, your health care provider may decide to postpone shingles vaccination to a future visit.  People with minor illnesses, such as a cold, may be vaccinated. People who are moderately or severely ill should usually  wait until they recover before getting live shingles vaccine.  Your health care provider can give you more information. 4. Risks of a vaccine reaction  Redness, soreness, swelling, or itching at the site of the injection and headache can happen after live shingles vaccine. Rarely, live shingles vaccine can cause rash or shingles.  People sometimes faint after medical procedures, including vaccination. Tell your provider if you feel dizzy or have vision changes or ringing in the ears.  As with any medicine, there is a very remote chance of a vaccine causing a severe allergic reaction, other serious injury, or death. 5. What if there is a serious problem? An allergic reaction could occur after the vaccinated person leaves the clinic. If you see signs of a severe allergic reaction (hives, swelling of the face and throat, difficulty breathing, a fast heartbeat, dizziness, or weakness), call 9-1-1 and get the person to the nearest hospital.  For other signs that concern you, call your health care provider.  Adverse reactions should be reported to the Vaccine Adverse Event Reporting System (VAERS). Your health care provider will usually file this report, or you can do it yourself. Visit the VAERS website at www.vaers.LAgents.no or call 401 237 7310. VAERS is only for reporting reactions, and VAERS staff do not give medical advice. 6. How can I learn more?  Ask your health care provider.  Call your local or state health department.  Contact the Centers for Disease Control and Prevention (CDC): ? Call 816-083-2847 (1-800-CDC-INFO) or ? Visit CDC's website at PicCapture.uy CDC Vaccine Information Statement Live Zoster Vaccine (12/05/2017) This information is not intended to replace advice given to you by your health care provider. Make sure you discuss any questions you have with your health care provider. Document Released: 11/20/2005 Document Revised: 05/14/2018 Document Reviewed:  09/04/2017 Elsevier Patient Education  2020 Elsevier Inc. Preventing Health Risks of Being Overweight Maintaining a healthy body weight is an important part of your overall health. Your healthy body weight depends on your age, gender, and height. Being overweight puts you at risk for many health problems, including:  Heart disease.  Diabetes.  Problems sleeping.  Joint problems. You can make changes to your diet and lifestyle to prevent these risks. Consider working with a health care provider or a dietitian to make these changes. What nutrition changes can be made?   Eat only as much as your body needs. In most cases, this is about 2,000 calories a day, but the amount varies depending on your height, gender, and activity level. Ask your health care provider how many calories you should have each day. Eating more than your body needs on a regular basis can cause you to become overweight or obese.  Eat slowly, and stop eating when you feel full.  Choose healthy foods, including: ? Fruits and vegetables. ? Lean meats. ? Low-fat dairy products. ? High-fiber foods, such as whole grains and beans. ? Healthy snacks like vegetable sticks, a piece of fruit, or a small amount of yogurt or cheese.  Avoid foods and drinks that are high in sugar, salt (sodium), saturated fat, or trans fat. This includes: ? Many desserts such as candy, cookies, and ice cream. ? Soda. ? Fried foods. ? Processed meats such as hot dogs or  lunch meats. ? Prepackaged snack foods. What lifestyle changes can be made?   Exercise for at least 150 minutes a week to prevent weight gain, or as often as recommended by your health care provider. Do moderate-intensity exercise, such as brisk walking. ? Spread it out by exercising for 30 minutes 5 days a week, or in short 10-minute bursts several times a day.  Find other ways to stay active and burn calories, such as yard work or a hobby that involves physical  activity.  Get at least 8 hours of sleep each night. When you are well-rested, you are more likely to be active and make healthy choices during the day. To sleep better: ? Try to go to bed and wake up at about the same time every day. ? Keep your bedroom dark, quiet, and cool. ? Make sure that your bed is comfortable. ? Avoid stimulating activities, such as watching television or exercising, for at least one hour before bedtime. Why are these changes important? Eating healthy and being active helps you lose weight and prevent health problems caused by being overweight. Making these changes can also help you manage stress, feel better mentally, and connect with friends and family. What can happen if changes are not made? Being overweight can affect you for your entire life. You may develop joint or bone problems that make it painful or difficult for you to play sports or do activities you enjoy. Being overweight puts stress on your heart and lungs and can lead to medical problems like diabetes, heart disease, and sleeping problems. Where to find support You can get support for preventing health risks of being overweight from:  Your health care provider or a dietitian. They can provide guidance about healthy eating and healthy lifestyle choices.  Weight loss support groups, online or in-person. Where to find more information  MyPlate: https://ball-collins.biz/ ? This an online tool that provides personalized recommendations about foods to eat each day.  The Centers for Disease Control and Prevention: AffordableScrapbook.gl ? This resource gives tips for managing weight and having an active lifestyle. Summary  To prevent unhealthy weight gain, it is important to maintain a healthy diet high in vegetables and whole grains, exercise regularly, and get at least 8 hours of sleep each night.  Making these changes helps prevent many long-term (chronic) health conditions that can shorten your life,  such as diabetes, heart disease, and stroke. This information is not intended to replace advice given to you by your health care provider. Make sure you discuss any questions you have with your health care provider. Document Released: 12/20/2016 Document Revised: 01/26/2017 Document Reviewed: 12/20/2016 Elsevier Patient Education  2020 Elsevier Inc. Living With Anxiety  After being diagnosed with an anxiety disorder, you may be relieved to know why you have felt or behaved a certain way. It is natural to also feel overwhelmed about the treatment ahead and what it will mean for your life. With care and support, you can manage this condition and recover from it. How to cope with anxiety Dealing with stress Stress is your bodys reaction to life changes and events, both good and bad. Stress can last just a few hours or it can be ongoing. Stress can play a major role in anxiety, so it is important to learn both how to cope with stress and how to think about it differently. Talk with your health care provider or a counselor to learn more about stress reduction. He or she may suggest some stress  reduction techniques, such as:  Music therapy. This can include creating or listening to music that you enjoy and that inspires you.  Mindfulness-based meditation. This involves being aware of your normal breaths, rather than trying to control your breathing. It can be done while sitting or walking.  Centering prayer. This is a kind of meditation that involves focusing on a word, phrase, or sacred image that is meaningful to you and that brings you peace.  Deep breathing. To do this, expand your stomach and inhale slowly through your nose. Hold your breath for 3-5 seconds. Then exhale slowly, allowing your stomach muscles to relax.  Self-talk. This is a skill where you identify thought patterns that lead to anxiety reactions and correct those thoughts.  Muscle relaxation. This involves tensing muscles then  relaxing them. Choose a stress reduction technique that fits your lifestyle and personality. Stress reduction techniques take time and practice. Set aside 5-15 minutes a day to do them. Therapists can offer training in these techniques. The training may be covered by some insurance plans. Other things you can do to manage stress include:  Keeping a stress diary. This can help you learn what triggers your stress and ways to control your response.  Thinking about how you respond to certain situations. You may not be able to control everything, but you can control your reaction.  Making time for activities that help you relax, and not feeling guilty about spending your time in this way. Therapy combined with coping and stress-reduction skills provides the best chance for successful treatment. Medicines Medicines can help ease symptoms. Medicines for anxiety include:  Anti-anxiety drugs.  Antidepressants.  Beta-blockers. Medicines may be used as the main treatment for anxiety disorder, along with therapy, or if other treatments are not working. Medicines should be prescribed by a health care provider. Relationships Relationships can play a big part in helping you recover. Try to spend more time connecting with trusted friends and family members. Consider going to couples counseling, taking family education classes, or going to family therapy. Therapy can help you and others better understand the condition. How to recognize changes in your condition Everyone has a different response to treatment for anxiety. Recovery from anxiety happens when symptoms decrease and stop interfering with your daily activities at home or work. This may mean that you will start to:  Have better concentration and focus.  Sleep better.  Be less irritable.  Have more energy.  Have improved memory. It is important to recognize when your condition is getting worse. Contact your health care provider if your symptoms  interfere with home or work and you do not feel like your condition is improving. Where to find help and support: You can get help and support from these sources:  Self-help groups.  Online and [employer] organizations.  A trusted spiritual leader.  Couples counseling.  Family education classes.  Family therapy. Follow these instructions at home:  Eat a healthy diet that includes plenty of vegetables, fruits, whole grains, low-fat dairy products, and lean protein. Do not eat a lot of foods that are high in solid fats, added sugars, or salt.  Exercise. Most adults should do the following: ? Exercise for at least 150 minutes each week. The exercise should increase your heart rate and make you sweat (moderate-intensity exercise). ? Strengthening exercises at least twice a week.  Cut down on caffeine, tobacco, alcohol, and other potentially harmful substances.  Get the right amount and quality of sleep. Most adults need  7-9 hours of sleep each night.  Make choices that simplify your life.  Take over-the-counter and prescription medicines only as told by your health care provider.  Avoid caffeine, alcohol, and certain over-the-counter cold medicines. These may make you feel worse. Ask your pharmacist which medicines to avoid.  Keep all follow-up visits as told by your health care provider. This is important. Questions to ask your health care provider  Would I benefit from therapy?  How often should I follow up with a health care provider?  How long do I need to take medicine?  Are there any long-term side effects of my medicine?  Are there any alternatives to taking medicine? Contact a health care provider if:  You have a hard time staying focused or finishing daily tasks.  You spend many hours a day feeling worried about everyday life.  You become exhausted by worry.  You start to have headaches, feel tense, or have nausea.  You urinate more than normal.  You have  diarrhea. Get help right away if:  You have a racing heart and shortness of breath.  You have thoughts of hurting yourself or others. If you ever feel like you may hurt yourself or others, or have thoughts about taking your own life, get help right away. You can go to your nearest emergency department or call:  Your local emergency services (911 in the [place]  A suicide crisis helpline, such as the National Suicide Prevention Lifeline at 818-740-41271-9013969052. This is open 24-hours a day. Summary  Taking steps to deal with stress can help calm you.  Medicines cannot cure anxiety disorders, but they can help ease symptoms.  Family, friends, and partners can play a big part in helping you recover from an anxiety disorder. This information is not intended to replace advice given to you by your health care provider. Make sure you discuss any questions you have with your health care provider. Document Released: 01/18/2016 Document Revised: 01/05/2017 Document Reviewed: 01/18/2016 Elsevier Patient Education  2020 Elsevier Inc.  Insomnia Insomnia is a sleep disorder that makes it difficult to fall asleep or stay asleep. Insomnia can cause fatigue, low energy, difficulty concentrating, mood swings, and poor performance at work or school. There are three different ways to classify insomnia:  Difficulty falling asleep.  Difficulty staying asleep.  Waking up too early in the morning. Any type of insomnia can be long-term (chronic) or short-term (acute). Both are common. Short-term insomnia usually lasts for three months or less. Chronic insomnia occurs at least three times a week for longer than three months. What are the causes? Insomnia may be caused by another condition, situation, or substance, such as:  Anxiety.  Certain medicines.  Gastroesophageal reflux disease (GERD) or other gastrointestinal conditions.  Asthma or other breathing conditions.  Restless legs syndrome, sleep apnea,  or other sleep disorders.  Chronic pain.  Menopause.  Stroke.  Abuse of alcohol, tobacco, or illegal drugs.  Mental health conditions, such as depression.  Caffeine.  Neurological disorders, such as Alzheimer's disease.  An overactive thyroid (hyperthyroidism). Sometimes, the cause of insomnia may not be known. What increases the risk? Risk factors for insomnia include:  Gender. Women are affected more often than men.  Age. Insomnia is more common as you get older.  Stress.  Lack of exercise.  Irregular work schedule or working night shifts.  Traveling between different time zones.  Certain medical and mental health conditions. What are the signs or symptoms? If you have insomnia, the  main symptom is having trouble falling asleep or having trouble staying asleep. This may lead to other symptoms, such as:  Feeling fatigued or having low energy.  Feeling nervous about going to sleep.  Not feeling rested in the morning.  Having trouble concentrating.  Feeling irritable, anxious, or depressed. How is this diagnosed? This condition may be diagnosed based on:  Your symptoms and medical history. Your health care provider may ask about: ? Your sleep habits. ? Any medical conditions you have. ? Your mental health.  A physical exam. How is this treated? Treatment for insomnia depends on the cause. Treatment may focus on treating an underlying condition that is causing insomnia. Treatment may also include:  Medicines to help you sleep.  Counseling or therapy.  Lifestyle adjustments to help you sleep better. Follow these instructions at home: Eating and drinking   Limit or avoid alcohol, caffeinated beverages, and cigarettes, especially close to bedtime. These can disrupt your sleep.  Do not eat a large meal or eat spicy foods right before bedtime. This can lead to digestive discomfort that can make it hard for you to sleep. Sleep habits   Keep a sleep  diary to help you and your health care provider figure out what could be causing your insomnia. Write down: ? When you sleep. ? When you wake up during the night. ? How well you sleep. ? How rested you feel the next day. ? Any side effects of medicines you are taking. ? What you eat and drink.  Make your bedroom a dark, comfortable place where it is easy to fall asleep. ? Put up shades or blackout curtains to block light from outside. ? Use a white noise machine to block noise. ? Keep the temperature cool.  Limit screen use before bedtime. This includes: ? Watching TV. ? Using your smartphone, tablet, or computer.  Stick to a routine that includes going to bed and waking up at the same times every day and night. This can help you fall asleep faster. Consider making a quiet activity, such as reading, part of your nighttime routine.  Try to avoid taking naps during the day so that you sleep better at night.  Get out of bed if you are still awake after 15 minutes of trying to sleep. Keep the lights down, but try reading or doing a quiet activity. When you feel sleepy, go back to bed. General instructions  Take over-the-counter and prescription medicines only as told by your health care provider.  Exercise regularly, as told by your health care provider. Avoid exercise starting several hours before bedtime.  Use relaxation techniques to manage stress. Ask your health care provider to suggest some techniques that may work well for you. These may include: ? Breathing exercises. ? Routines to release muscle tension. ? Visualizing peaceful scenes.  Make sure that you drive carefully. Avoid driving if you feel very sleepy.  Keep all follow-up visits as told by your health care provider. This is important. Contact a health care provider if:  You are tired throughout the day.  You have trouble in your daily routine due to sleepiness.  You continue to have sleep problems, or your sleep  problems get worse. Get help right away if:  You have serious thoughts about hurting yourself or someone else. If you ever feel like you may hurt yourself or others, or have thoughts about taking your own life, get help right away. You can go to your nearest emergency department or  call:  Your local emergency services (911 in the [place]  A suicide crisis helpline, such as the National Suicide Prevention Lifeline at 530-671-9437. This is open 24 hours a day. Summary  Insomnia is a sleep disorder that makes it difficult to fall asleep or stay asleep.  Insomnia can be long-term (chronic) or short-term (acute).  Treatment for insomnia depends on the cause. Treatment may focus on treating an underlying condition that is causing insomnia.  Keep a sleep diary to help you and your health care provider figure out what could be causing your insomnia. This information is not intended to replace advice given to you by your health care provider. Make sure you discuss any questions you have with your health care provider. Document Released: 01/21/2000 Document Revised: 01/05/2017 Document Reviewed: 11/02/2016 Elsevier Patient Education  2020 [employer].

## 2018-12-18 NOTE — Addendum Note (Signed)
Addended by: Gerarda Fraction A on: 12/18/2018 08:34 AM   Modules accepted: Orders

## 2018-12-18 NOTE — Progress Notes (Signed)
Patient ID: Gabriel Butler, male   DOB: 28-Feb-1950, 68 y.o.   MRN: [id number] Patient brought typed medication list to clinic.  Reviewed list and Epic and updates made today.  See paper chart for printed copy of medication list.

## 2018-12-31 ENCOUNTER — Other Ambulatory Visit: Payer: Self-pay | Admitting: Registered Nurse

## 2018-12-31 DIAGNOSIS — F419 Anxiety disorder, unspecified: Secondary | ICD-10-CM

## 2019-01-04 ENCOUNTER — Encounter: Payer: Self-pay | Admitting: Registered Nurse

## 2019-01-04 NOTE — Telephone Encounter (Signed)
Per epic review patient last Rx on 12/11/2018 90 tabs alprazolam 0.5mg  po TID prn anxiety #90 RF0 and had office visit.  Will require another office visit Feb 2021 for refills  Per [place] PMP website last fill 12/31/2018.  All Rx from [facility] providers in previous 2 years. Electronic Rx entered Alprazolam 0.5mg  po TID prn anxiety #60 RF0 start 03 Feb 2019.

## 2019-02-04 ENCOUNTER — Other Ambulatory Visit: Payer: Self-pay | Admitting: Physician Assistant

## 2019-02-04 ENCOUNTER — Other Ambulatory Visit: Payer: Self-pay

## 2019-02-04 DIAGNOSIS — K219 Gastro-esophageal reflux disease without esophagitis: Secondary | ICD-10-CM

## 2019-02-04 DIAGNOSIS — G4700 Insomnia, unspecified: Secondary | ICD-10-CM

## 2019-02-04 DIAGNOSIS — F419 Anxiety disorder, unspecified: Secondary | ICD-10-CM

## 2019-02-04 MED ORDER — SERTRALINE HCL 50 MG PO TABS: 150.0000 mg | ORAL_TABLET | Freq: Every day | ORAL | 1 refills | Status: DC

## 2019-02-04 MED ORDER — TRAZODONE HCL 50 MG PO TABS: ORAL_TABLET | ORAL | 3 refills | Status: DC

## 2019-02-04 MED ORDER — PANTOPRAZOLE SODIUM 40 MG PO TBEC: DELAYED_RELEASE_TABLET | ORAL | 1 refills | Status: DC

## 2019-02-25 ENCOUNTER — Ambulatory Visit: Payer: Self-pay

## 2019-02-25 ENCOUNTER — Other Ambulatory Visit: Payer: Self-pay

## 2019-02-25 DIAGNOSIS — Z0000 Encounter for general adult medical examination without abnormal findings: Secondary | ICD-10-CM

## 2019-02-25 LAB — POCT URINALYSIS DIPSTICK
Bilirubin, UA: NEGATIVE
Blood, UA: NEGATIVE
Glucose, UA: NEGATIVE
Ketones, UA: NEGATIVE
Leukocytes, UA: NEGATIVE
Nitrite, UA: NEGATIVE
Protein, UA: NEGATIVE
Spec Grav, UA: 1.02 (ref 1.010–1.025)
Urobilinogen, UA: 0.2 E.U./dL
pH, UA: 6 (ref 5.0–8.0)

## 2019-02-25 NOTE — Progress Notes (Signed)
Patient is here today for labs prior to physical scheduled with Dr.Hunt on 03/04/19.

## 2019-02-26 LAB — CMP12+LP+TP+TSH+6AC+PSA+CBC…
Basophils Absolute: 0 10*3/uL (ref 0.0–0.2)
Basos: 1 %
EOS (ABSOLUTE): 0.1 10*3/uL (ref 0.0–0.4)
Eos: 2 %
Hematocrit: 48.2 % (ref 37.5–51.0)
Hemoglobin: 16 g/dL (ref 13.0–17.7)
Immature Grans (Abs): 0 10*3/uL (ref 0.0–0.1)
Immature Granulocytes: 0 %
Lymphocytes Absolute: 1.6 10*3/uL (ref 0.7–3.1)
Lymphs: 33 %
MCH: 30.6 pg (ref 26.6–33.0)
MCHC: 33.2 g/dL (ref 31.5–35.7)
MCV: 92 fL (ref 79–97)
Monocytes Absolute: 0.5 10*3/uL (ref 0.1–0.9)
Monocytes: 10 %
Neutrophils Absolute: 2.6 10*3/uL (ref 1.4–7.0)
Neutrophils: 54 %
Platelets: 199 10*3/uL (ref 150–450)
RBC: 5.23 x10E6/uL (ref 4.14–5.80)
RDW: 12.1 % (ref 11.6–15.4)
WBC: 4.7 10*3/uL (ref 3.4–10.8)

## 2019-02-26 LAB — MICROALBUMIN / CREATININE URINE RATIO
Creatinine, Urine: 93.8 mg/dL
Microalb/Creat Ratio: 5 mg/g creat (ref 0–29)
Microalbumin, Urine: 4.4 ug/mL

## 2019-02-26 LAB — HGB A1C W/O EAG: Hgb A1c MFr Bld: 6.4 % — ABNORMAL HIGH (ref 4.8–5.6)

## 2019-03-04 ENCOUNTER — Ambulatory Visit: Payer: Self-pay | Admitting: Occupational Medicine

## 2019-03-04 ENCOUNTER — Other Ambulatory Visit: Payer: Self-pay

## 2019-03-04 VITALS — BP 118/84 | HR 54 | Temp 98.1°F | Ht 75.0 in | Wt 219.6 lb

## 2019-03-04 DIAGNOSIS — Z0000 Encounter for general adult medical examination without abnormal findings: Secondary | ICD-10-CM

## 2019-03-04 NOTE — Progress Notes (Signed)
Subjective:     Patient ID: Gabriel Butler, male   DOB: [birth year], 69 y.o.   MRN: [id number]  HPI   The patient is a very pleasant 69 year old retiree from the city of [place].  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  He is enjoying other jobs in his retirement and is working at the [employer] and is also enjoys his work on [employer].  Current health conditions include hypertension, coronary artery disease, GERD, diabetes, anxiety, and right shoulder pain.  All are doing pretty well.  His hypertension and cardiovascular disease are managed by Dr. Raymon Mutton.  His diabetes appears to be under good control A1c drawn last week was 6.4.  Prior year was 6.5.  Unfortunately his executive panel labs were not run.  CBC was normal.  Urinalysis was also normal.  He was in here 3 months ago for refill on his Xanax.  He is doing very well and taking Xanax 0.5 about 1 a day.  Also takes Zoloft.    His only real complaint today is right shoulder pain.  On exam he has limitation with forward flexion and abduction and internal rotation of the right shoulder.  He can only forward flex to about 120 degrees and abduct to about 80.  This is been going on for about a year and a half.  Exam is consistent with frozen shoulder.  He has not sought medical treatment for this yet.  No history of recent injury or accident but says that he has "lots of old sports injuries".  He also sees Dr. Jenne Campus for his allergies and chronic sinusitis.  As for health maintenance he is due for his colonoscopy is due 2023.  I do not have PSA results because labs were not done for some reason when they were drawn last week.  Going to have Ann update the medication list below.  He is currently not taking Lotrisone, Voltaren gel, MetroGel, Singulair, Bactroban, or Nitrostat.  He is taking 81 mg of aspirin every day and pantoprazole sodium 40 mg once a day.  Current Outpatient Medications on File Prior to Visit   Medication Sig Dispense Refill  . ALPRAZolam (XANAX) 0.5 MG tablet Take 1 tablet (0.5 mg total) by mouth 3 (three) times daily as needed for anxiety. 60 tablet 0  . atorvastatin (LIPITOR) 80 MG tablet Take 1 tablet by mouth daily after supper.    . cetirizine (ZYRTEC) 10 MG tablet Take 1 tablet by mouth daily.    . clopidogrel (PLAVIX) 75 MG tablet Take 1 tablet by mouth daily.    . clotrimazole-betamethasone (LOTRISONE) cream APPLY TO AFFECTED AREAS DAILY AS DIRECTED 45 g 1  . diclofenac sodium (VOLTAREN) 1 % GEL Apply 4 g topically 4 (four) times daily. 1 Tube 0  . ezetimibe (ZETIA) 10 MG tablet Take 1 tablet by mouth daily.    Marland Kitchen lisinopril (ZESTRIL) 10 MG tablet TAKE 1 TABLET BY MOUTH DAILY 90 tablet 3  . metoprolol succinate (TOPROL-XL) 50 MG 24 hr tablet TAKE 1 TABLET BY MOUTH TWICE DAILY 180 tablet 3  . metroNIDAZOLE (METROGEL) 1 % gel Apply 1 application topically daily as needed.    . montelukast (SINGULAIR) 10 MG tablet Take 1 tablet (10 mg total) by mouth at bedtime. 90 tablet 1  . mupirocin ointment (BACTROBAN) 2 % Apply 1 application topically daily.    . nitroGLYCERIN (NITROSTAT) 0.4 MG SL tablet Take 1 tablet by mouth daily as needed.    Marland Kitchen  pantoprazole (PROTONIX) 40 MG tablet Take one tablet once to twice daily prn 90 tablet 1  . sertraline (ZOLOFT) 50 MG tablet Take 3 tablets (150 mg total) by mouth daily. 270 tablet 1  . spironolactone (ALDACTONE) 25 MG tablet TAKE ONE-HALF TABLET BY MOUTH EVERY DAY 45 tablet 3  . traZODone (DESYREL) 50 MG tablet Take 1 to 2 tablets po hs prn for insomnia 60 tablet 3   No current facility-administered medications on file prior to visit.   Review of Systems    Denies syncope, chest pain, low blood sugars, fatigue, weight loss.  Positive for right shoulder pain and limited range of motion right shoulder. Objective:   Physical Exam Vitals:   03/04/19 1016  BP: 118/84  Pulse: (!) 54  Temp: 98.1 F (36.7 C)  SpO2: 98%        Assessment:     Routine physical exam-right shoulder pain with probable adhesive capsulitis    Plan:     Advised to follow-up with orthopedics regarding probable right shoulder adhesive capsulitis.  They will diagnose and manage from there.  Recommend redrawing male executive panel and follow-up with [place] in 3 months for continued management of diabetes and anxiety.

## 2019-03-10 ENCOUNTER — Other Ambulatory Visit: Payer: Self-pay

## 2019-03-10 DIAGNOSIS — Z0000 Encounter for general adult medical examination without abnormal findings: Secondary | ICD-10-CM

## 2019-03-10 NOTE — Progress Notes (Signed)
Patient comes in today for redraw labs. Lab order was cancelled by [facility] previously.

## 2019-03-11 LAB — CMP12+LP+TP+TSH+6AC+PSA+CBC…
ALT: 34 IU/L (ref 0–44)
AST: 30 IU/L (ref 0–40)
Albumin/Globulin Ratio: 1.9 (ref 1.2–2.2)
Albumin: 4.2 g/dL (ref 3.8–4.8)
Alkaline Phosphatase: 78 IU/L (ref 39–117)
BUN/Creatinine Ratio: 15 (ref 10–24)
BUN: 18 mg/dL (ref 8–27)
Basophils Absolute: 0.1 10*3/uL (ref 0.0–0.2)
Basos: 1 %
Bilirubin Total: 0.4 mg/dL (ref 0.0–1.2)
Calcium: 9.3 mg/dL (ref 8.6–10.2)
Chloride: 103 mmol/L (ref 96–106)
Chol/HDL Ratio: 2.5 ratio (ref 0.0–5.0)
Cholesterol, Total: 98 mg/dL — ABNORMAL LOW (ref 100–199)
Creatinine, Ser: 1.18 mg/dL (ref 0.76–1.27)
EOS (ABSOLUTE): 0.1 10*3/uL (ref 0.0–0.4)
Eos: 2 %
Estimated CHD Risk: <0.5 times avg. (ref 0.0–1.0)
Free Thyroxine Index: 0.9 — ABNORMAL LOW (ref 1.2–4.9)
GFR calc Af Amer: 73 mL/min/{1.73_m2} (ref >59)
GFR calc non Af Amer: 63 mL/min/{1.73_m2} (ref >59)
GGT: 21 IU/L (ref 0–65)
Globulin, Total: 2.2 g/dL (ref 1.5–4.5)
Glucose: 113 mg/dL — ABNORMAL HIGH (ref 65–99)
HDL: 39 mg/dL — ABNORMAL LOW (ref >39)
Hematocrit: 45.3 % (ref 37.5–51.0)
Hemoglobin: 15.8 g/dL (ref 13.0–17.7)
Immature Grans (Abs): 0 10*3/uL (ref 0.0–0.1)
Immature Granulocytes: 0 %
Iron: 80 ug/dL (ref 38–169)
LDH: 245 IU/L — ABNORMAL HIGH (ref 121–224)
LDL Chol Calc (NIH): 46 mg/dL (ref 0–99)
Lymphocytes Absolute: 1.5 10*3/uL (ref 0.7–3.1)
Lymphs: 29 %
MCH: 31.2 pg (ref 26.6–33.0)
MCHC: 34.9 g/dL (ref 31.5–35.7)
MCV: 90 fL (ref 79–97)
Monocytes Absolute: 0.5 10*3/uL (ref 0.1–0.9)
Monocytes: 9 %
Neutrophils Absolute: 3.1 10*3/uL (ref 1.4–7.0)
Neutrophils: 59 %
Phosphorus: 3.9 mg/dL (ref 2.8–4.1)
Platelets: 208 10*3/uL (ref 150–450)
Potassium: 5 mmol/L (ref 3.5–5.2)
Prostate Specific Ag, Serum: 1.8 ng/mL (ref 0.0–4.0)
RBC: 5.06 x10E6/uL (ref 4.14–5.80)
RDW: 12.2 % (ref 11.6–15.4)
Sodium: 140 mmol/L (ref 134–144)
T3 Uptake Ratio: 19 % — ABNORMAL LOW (ref 24–39)
T4, Total: 4.8 ug/dL (ref 4.5–12.0)
TSH: 1.41 u[IU]/mL (ref 0.450–4.500)
Total Protein: 6.4 g/dL (ref 6.0–8.5)
Triglycerides: 55 mg/dL (ref 0–149)
Uric Acid: 4.9 mg/dL (ref 3.8–8.4)
VLDL Cholesterol Cal: 13 mg/dL (ref 5–40)
WBC: 5.1 10*3/uL (ref 3.4–10.8)

## 2019-03-17 ENCOUNTER — Encounter: Payer: Self-pay | Admitting: Registered Nurse

## 2019-03-17 ENCOUNTER — Other Ambulatory Visit: Payer: Self-pay | Admitting: Registered Nurse

## 2019-03-17 DIAGNOSIS — F419 Anxiety disorder, unspecified: Secondary | ICD-10-CM

## 2019-03-17 NOTE — Telephone Encounter (Signed)
Noted  He will still need another face to face visit 3 months from his last office visit.

## 2019-03-17 NOTE — Telephone Encounter (Signed)
Labs drawn before visit came back from [facility] as cancelled. Dr Alto Denver requested redraw labs on 03/10/19 and then return for follow up in 3 months.Labs were completed.

## 2019-03-17 NOTE — Telephone Encounter (Signed)
Reviewed [place] PMP website all alprazolam Rx from [facility] providers last fill 02/17/2019 0.5mg  po TID prn #60 RF0  Last office visit 03/04/2019 with Dr Alto Denver was told to repeat male exec panel in 3 months and face to face  Last office visit with me 12/11/2018 renal and liver function stable/normal labs 03/10/2019.  Refill electronic sent to his pharmacy of choice alprazolam 0.5mg  sig t1 po TID prn anxiety #60 RF0

## 2019-03-17 NOTE — Telephone Encounter (Signed)
noted 

## 2019-04-15 DIAGNOSIS — M1711 Unilateral primary osteoarthritis, right knee: Secondary | ICD-10-CM | POA: Diagnosis not present

## 2019-04-15 DIAGNOSIS — M19011 Primary osteoarthritis, right shoulder: Secondary | ICD-10-CM | POA: Diagnosis not present

## 2019-04-17 ENCOUNTER — Other Ambulatory Visit: Payer: Self-pay | Admitting: Registered Nurse

## 2019-04-17 DIAGNOSIS — F419 Anxiety disorder, unspecified: Secondary | ICD-10-CM

## 2019-05-02 ENCOUNTER — Other Ambulatory Visit: Payer: Self-pay

## 2019-05-09 DIAGNOSIS — I10 Essential (primary) hypertension: Secondary | ICD-10-CM | POA: Diagnosis not present

## 2019-05-09 DIAGNOSIS — E7800 Pure hypercholesterolemia, unspecified: Secondary | ICD-10-CM | POA: Diagnosis not present

## 2019-05-09 DIAGNOSIS — I2510 Atherosclerotic heart disease of native coronary artery without angina pectoris: Secondary | ICD-10-CM | POA: Diagnosis not present

## 2019-05-09 DIAGNOSIS — I255 Ischemic cardiomyopathy: Secondary | ICD-10-CM | POA: Diagnosis not present

## 2019-05-12 ENCOUNTER — Ambulatory Visit: Payer: 59 | Attending: Orthopedic Surgery

## 2019-05-12 ENCOUNTER — Other Ambulatory Visit: Payer: Self-pay

## 2019-05-12 DIAGNOSIS — M25552 Pain in left hip: Secondary | ICD-10-CM | POA: Insufficient documentation

## 2019-05-12 DIAGNOSIS — M6281 Muscle weakness (generalized): Secondary | ICD-10-CM | POA: Insufficient documentation

## 2019-05-12 DIAGNOSIS — M79605 Pain in left leg: Secondary | ICD-10-CM | POA: Insufficient documentation

## 2019-05-12 DIAGNOSIS — M25511 Pain in right shoulder: Secondary | ICD-10-CM | POA: Diagnosis not present

## 2019-05-12 DIAGNOSIS — M545 Low back pain, unspecified: Secondary | ICD-10-CM

## 2019-05-12 DIAGNOSIS — M25561 Pain in right knee: Secondary | ICD-10-CM | POA: Diagnosis not present

## 2019-05-12 DIAGNOSIS — M25562 Pain in left knee: Secondary | ICD-10-CM | POA: Diagnosis not present

## 2019-05-12 DIAGNOSIS — G8929 Other chronic pain: Secondary | ICD-10-CM | POA: Insufficient documentation

## 2019-05-12 NOTE — Therapy (Signed)
[facility] [address] [place], [address] Phone: [phone]   Fax:  [phone]  Physical Therapy Evaluation  Patient Details  Name: Gabriel Butler MRN: [id number] Date of Birth: [birth year] Referring Provider (PT): Kurtis Bushman, MD   Encounter Date: 05/12/2019  PT End of Session - 05/12/19 0804    Visit Number  1    Number of Visits  17    Date for PT Re-Evaluation  07/10/19    Authorization Type  1    Authorization Time Period  of 10 progress report    PT Start Time  0804    PT Stop Time  0949    PT Time Calculation (min)  105 min    Activity Tolerance  Patient tolerated treatment well    Behavior During Therapy  [facility] for tasks assessed/performed       Past Medical History:  Diagnosis Date  . GERD (gastroesophageal reflux disease)   . Heart attack ([place]) 03/05/2013   Coronary stent placement.  . Hypertension     Past Surgical History:  Procedure Laterality Date  . COLONOSCOPY  2013  . CORONARY ANGIOPLASTY WITH STENT PLACEMENT    . HERNIA REPAIR Right 03/26/14   Direct and indirect hernia noted, large ultra Pro mesh utilized.    There were no vitals filed for this visit.   Subjective Assessment - 05/12/19 0808    Subjective  R shoulder 5/10 (when raising his R arm up into flexion) curently, 7/10 at worst for the past 3 months. R knee: 0/10 currently,  5/10 at most for the past 3 months (including before the cortisone shot).  Back (low back, L > R): 0/10 currently (pt sitting on a chair), 4/10 at most for the past 3 months (after yardwork)    Pertinent History  R knee and R shoulder pain. Pt had an injection in R knee (cortisone) about a month ago which has helped substantially. R knee pain started about 6 months ago. R shoulder has been bothering him for tha past year. Raising his arm up into flexion causes R anterior shoulder. Pt also states having low back pain. Aggravating factors include  putting much in his yard. Has a hx of arthritis. 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 His general practitioner suggested that he might have a frozen shoulder.  Pt is R hand dominand.  Pt states that he has not been keeping up with his stretches.  Back pain has been off and on for several years. Had chiropractic treatment which helped.  Has not had surgeries for his R shoulder, R knee and low back. Denies LE paresthesia, or loss of bowel or bladder control.   Has not had PT for R knee or shoulder since high school   Patient Stated Goals  Wants to be able to play golf, make his pain more comfortable. Be able to keep up with grandchildren.    Currently in Pain?  Yes    Pain Score  5     Pain Location  Shoulder    Pain Orientation  Right    Pain Descriptors / Indicators  --   "just pain"   Pain Type  Chronic pain    Pain Onset  More than a month ago    Pain Frequency  Occasional    Aggravating Factors   R shoulder: raising his arm up past 80 degrees,  lifting his grandchildren.  R knee: standing up after sitting for a while (about 30 min to an hour).  Back: bending over, using his push-mower. Raking also bothers his R shoulder.    Pain Relieving Factors  R shoulder: rest, arthritis cream.  R knee: heat, hot showers. Back: hot showers, leaning back, moving around         [facility] PT Assessment - 05/12/19 0808      Assessment   Medical Diagnosis  R shoulder, R knee arthritis    Referring Provider (PT)  Cassell Smiles, MD    Onset Date/Surgical Date  04/15/19    Hand Dominance  Right    Prior Therapy  L hip      Precautions   Precaution Comments  no known precautions      Balance Screen   Has the patient fallen in the past 6 months  No    Has the patient had a decrease in activity level because of a fear of falling?   No    Is the patient reluctant to leave their home because of a fear of falling?   No       Observation/Other Assessments   Observations  R shoulder ER in supine: feels better with axial distraction, then compmression.     Focus on Therapeutic Outcomes (FOTO)   shoulder FOTO 50      Posture/Postural Control   Posture Comments  L shoulder lower, protracted shoulder and neck, slight R latearl shift, slight kyphotis, R greater trochanter lower. R hip in in ER      AROM   Right Shoulder Flexion  95 Degrees   118 AAROM, stiff end feel   Right Shoulder ABduction  90 Degrees   scaption; 102 scaption AAROM; Crepitus   Left Shoulder Flexion  169 Degrees    Left Shoulder ABduction  143 Degrees    Lumbar Flexion  WFL     Lumbar Extension  WFL with R trunk rotation (with L low back pain/pressure)    Lumbar - Right Side Bend  WFL     Lumbar - Left Side Bend  WFL    Lumbar - Right Rotation  WFL    Lumbar - Left Rotation  WFL with L lateral thoracic catch      PROM   Overall PROM Comments  Hip IR at 90/90: R 36 degrees, L 13 degrees with L posterior greater trochatner surface pain.     Right Hip Extension  8    Left Hip Extension  7      Strength   Right Shoulder Flexion  4/5   with anterior shoulder pain   Right Shoulder ABduction  4/5   scaption; with pain and crepitus   Right Shoulder Internal Rotation  4/5   with anterior shoulder pain   Right Shoulder External Rotation  4/5   with anterior shoulder pain   Left Shoulder Flexion  5/5    Left Shoulder ABduction  5/5    Left Shoulder Internal Rotation  5/5    Left Shoulder External Rotation  4+/5    Right Elbow Flexion  4/5   pain eccentrically   Right Hip Flexion  4-/5    Right Hip Extension  3+/5    Right Hip ABduction  4/5    Left Hip Flexion  4-/5    Left Hip Extension  4-/5    Left Hip ABduction  4/5   with L lateral trunk tightness   Right Knee Flexion  4/5    Right Knee Extension  5/5    Left Knee Flexion  4/5    Left Knee Extension  5/5      Palpation   Palpation comment  crepitus with pain R shoulder  during flexion AAROM; TTP R anterior shoulder around the biceps tendon area.   Decreased R shoulder pain wiht A to P pressure      Special Tests   Other special tests  (+) empty can, hawkins kennedy, neers impingement; (+) special test suggesting posterior nutation of R innominate.                 Objective measurements completed on examination: See above findings.        Repeated flexion: 10x: loosened back tightness but pt felt increased L lateral hip discomfort. Decreased back pain in standing per pt.      no blood pressure problems and no latex band allergies per pt  Pt adds also feeling muscle cramps B lateral thigh as well as medial thigh  Medbridge Access Code [email or web address]   Manual therapy   Supine A to P to R shoulder grade 3- to 3 with arm in abduction  Supine inferior glide R shoulder with arm in abduction   Therapeutic exercise   Seated manually resisted R elbow extension isometrics, 10x5 seconds. R anterior shoulder discomfort slight  Then with gentle distraction 10x2 with 5 second holds      seated R shoulder self inferior mobilization 10x5 seconds    Reviewed and given as part of his HEP. Pt demonstrated and verbalized understanding. Handout provided.     Improved exercise technique, movement at target joints, use of target muscles after mod verbal, visual, tactile cues.   Response to treatment Decreased R shoulder pain with flexion after shoulder distraction related treatment.    Clinical impression  Patient is a 69 year old male who came to physical therapy secondary to R shoulder and R knee pain. He also presents with low back pain, altered posture, limited R shoulder AROM, limited L hip IR ROM, altered R shoulder, lumbar and hip mechanics, R shoulder joint stiffness, R shoulder, scapular and bilateral hip weakness, positive special tests suggesting R shoulder impingement as well as low back involvement, and difficulty performing functional tasks  such as reaching, yard work, and standing up after prolonged sitting secondary to pain. Pt will benefit from skilled physical therapy services to address the aforementioned deficits.      PT Education - 05/12/19 [phone]    Education provided  Yes    Education Details  ther-e, HEP, plan of care    Person(s) Educated  Patient    Methods  Explanation;Demonstration;Tactile cues;Verbal cues    Comprehension  Returned demonstration;Verbalized understanding       PT Short Term Goals - 05/12/19 1015      PT SHORT TERM GOAL #1   Title  Patient will be independent with his HEP to decrease R shoulder, R knee and low back pain as well as to decrease difficulty reaching, standing up from prolonged sitting, and performing yard work.    Baseline  Pt has started his HEP (05/12/2019)    Time  3    Period  Weeks    Status  New    Target Date  06/05/19        PT Long Term Goals - 05/12/19 1016      PT LONG TERM GOAL #1   Title  Patient will have a decrease in  R shoulder pain to 3/10 or less at worst to promote ability to reach, perform yard work, play golf more comfortably.    Baseline  7/10 R shoulder pain at most for the past 3 months (05/12/2019)    Time  8    Period  Weeks    Status  New    Target Date  07/10/19      PT LONG TERM GOAL #2   Title  Pt will have a decrease in R knee pain to 2/10 or less at worst to promote ability to stand up after prolonged sitting, as well as perform yardwork more comfortably.    Baseline  5/10 R knee pain at most for the past 3 months (05/12/2019)    Time  8    Period  Weeks    Status  New    Target Date  07/10/19      PT LONG TERM GOAL #3   Title  Patient will have a decrease in low back pain to 1/10 or less at most to promote ability to perform standing tasks as well as yard work more comfortably.    Baseline  4/10 back pain at most for the past 3 months (05/12/2019)    Time  8    Period  Weeks    Status  New    Target Date  07/10/19      PT LONG TERM  GOAL #4   Title  Pt will improve his shoulder FOTO by at least 10 points as a demonstration of improved function.    Baseline  shoulder FOTO: 50 (05/12/2019)    Time  8    Period  Weeks    Status  New    Target Date  07/10/19      PT LONG TERM GOAL #5   Title  Pt will improve R shoulder ER and IR strength by at least 1/2 MMT grade to promote ability to raise his R arm with less pain.    Baseline  R shoulder ER and IR 4/5 (05/12/2019)    Time  8    Period  Weeks    Status  New    Target Date  07/10/19      Additional Long Term Goals   Additional Long Term Goals  Yes      PT LONG TERM GOAL #6   Title  Patient will improve R hip extension and abduction strength by at least 1/2 MMT grade to promote ability to perform closed chain tasks with less knee pain.    Time  8    Period  Weeks    Status  New    Target Date  07/10/19             Plan - 05/12/19 1008    Clinical Impression Statement  Patient is a 69 year old male who came to physical therapy secondary to R shoulder and R knee pain. He also presents with low back pain, altered posture, limited R shoulder AROM, limited L hip IR ROM, altered R shoulder, lumbar and hip mechanics, R shoulder joint stiffness, R shoulder, scapular and bilateral hip weakness, positive special tests suggesting R shoulder impingement as well as low back involvement, and difficulty performing functional tasks such as reaching, yard work, and standing up after prolonged sitting secondary to pain. Pt will benefit from skilled physical therapy services to address the aforementioned deficits.    Personal Factors and Comorbidities  Age;Past/Current Experience;Time since onset of injury/illness/exacerbation;Comorbidity 1  Comorbidities  Hx of HTN    Examination-Activity Limitations  Bend;Sit;Transfers;Lift    Stability/Clinical Decision Making  Evolving/Moderate complexity    Clinical Decision Making  Moderate    Clinical Presentation due to:  R shoulder pain  seems to be worsening based on subjective reports    Rehab Potential  Fair    Clinical Impairments Affecting Rehab Potential  (+) motivated, likes to be active; (-) Chronicity of condition, age, multiple areas of pain    PT Frequency  2x / week    PT Duration  8 weeks    PT Treatment/Interventions  Therapeutic activities;Therapeutic exercise;Neuromuscular re-education;Patient/family education;Manual techniques;Dry needling;Spinal Manipulations;Joint Manipulations;Aquatic Therapy;Electrical Stimulation;Iontophoresis 4mg /ml Dexamethasone;Traction    PT Next Visit Plan  R shoulder joint mobility/distraction, ER, IR, scapular strengthening, posture, hip mobility, trunk, glute strengthening and lumbopelvic and femoral control, manual techniques, modalities PRN    PT Home Exercise Plan  Medbridge Access Code [email or web address]    Consulted and Agree with Plan of Care  Patient       Patient will benefit from skilled therapeutic intervention in order to improve the following deficits and impairments:  Pain, Postural dysfunction, Impaired UE functional use, Improper body mechanics, Impaired flexibility, Hypomobility, Decreased strength, Decreased range of motion  Visit Diagnosis: Chronic right shoulder pain - Plan: PT plan of care cert/re-cert  Chronic pain of right knee - Plan: PT plan of care cert/re-cert  Chronic bilateral low back pain, unspecified whether sciatica present - Plan: PT plan of care cert/re-cert  Muscle weakness (generalized) - Plan: PT plan of care cert/re-cert     Problem List Patient Active Problem List   Diagnosis Date Noted  . Disorder of bursae of shoulder region 12/11/2018  . Contusion of shoulder region 12/11/2018  . Right inguinal hernia 03/20/2014  . Benign essential hypertension 05/15/2013  . Ischemic cardiomyopathy 03/14/2013  . Pure hypercholesterolemia 03/14/2013  . Coronary artery disease involving native coronary artery of native heart without angina pectoris  03/07/2013  . Diabetes mellitus (HCC) 03/07/2013  . GERD (gastroesophageal reflux disease) 03/07/2013  . STEMI (ST elevation myocardial infarction) (HCC) 03/07/2013    Loralyn Freshwater PT, DPT   05/12/2019, 4:31 PM  [facility] [address], [place], [address] Phone: [phone]   Fax:  [phone]  Name: Kian Bassinger MRN: [id number] Date of Birth: [birth year]

## 2019-05-12 NOTE — Patient Instructions (Signed)
Access Code: [email or web address] URL: [email or web address] Date: 05/12/2019 Prepared by: Loralyn Freshwater  Exercises Seated Shoulder Inferior Glide - 1 x daily - 7 x weekly - 3 sets - 10 reps - 5 seconds hold

## 2019-05-14 ENCOUNTER — Ambulatory Visit: Payer: 59

## 2019-05-14 ENCOUNTER — Other Ambulatory Visit: Payer: Self-pay

## 2019-05-14 DIAGNOSIS — G8929 Other chronic pain: Secondary | ICD-10-CM | POA: Diagnosis not present

## 2019-05-14 DIAGNOSIS — M6281 Muscle weakness (generalized): Secondary | ICD-10-CM | POA: Diagnosis not present

## 2019-05-14 DIAGNOSIS — M79605 Pain in left leg: Secondary | ICD-10-CM | POA: Diagnosis not present

## 2019-05-14 DIAGNOSIS — M25552 Pain in left hip: Secondary | ICD-10-CM | POA: Diagnosis not present

## 2019-05-14 DIAGNOSIS — M25562 Pain in left knee: Secondary | ICD-10-CM | POA: Diagnosis not present

## 2019-05-14 DIAGNOSIS — M25561 Pain in right knee: Secondary | ICD-10-CM

## 2019-05-14 DIAGNOSIS — M545 Low back pain, unspecified: Secondary | ICD-10-CM

## 2019-05-14 DIAGNOSIS — M25511 Pain in right shoulder: Secondary | ICD-10-CM | POA: Diagnosis not present

## 2019-05-14 DIAGNOSIS — F419 Anxiety disorder, unspecified: Secondary | ICD-10-CM

## 2019-05-14 MED ORDER — ALPRAZOLAM 0.5 MG PO TABS: ORAL_TABLET | ORAL | 0 refills | Status: AC

## 2019-05-14 NOTE — Telephone Encounter (Signed)
Pending re-evaluation on scheduled visit 05/19/2019.

## 2019-05-14 NOTE — Therapy (Signed)
[place] [facility] [address] [place], [address] Phone: [phone]   Fax:  [phone]  Physical Therapy Treatment  Patient Details  Name: Gabriel Butler MRN: [id number] Date of Birth: Feb 23, 1950 Referring Provider (PT): Cassell Smiles, MD   Encounter Date: 05/14/2019  PT End of Session - 05/14/19 0850    Visit Number  2    Number of Visits  17    Date for PT Re-Evaluation  07/10/19    Authorization Type  2    Authorization Time Period  of 10 progress report    PT Start Time  0850    PT Stop Time  0930    PT Time Calculation (min)  40 min    Activity Tolerance  Patient tolerated treatment well    Behavior During Therapy  [facility] for tasks assessed/performed       Past Medical History:  Diagnosis Date  . GERD (gastroesophageal reflux disease)   . Heart attack (HCC) 03/05/2013   Coronary stent placement.  . Hypertension     Past Surgical History:  Procedure Laterality Date  . COLONOSCOPY  2013  . CORONARY ANGIOPLASTY WITH STENT PLACEMENT    . HERNIA REPAIR Right 03/26/14   Direct and indirect hernia noted, large ultra Pro mesh utilized.    There were no vitals filed for this visit.  Subjective Assessment - 05/14/19 0852    Subjective  Patient reported he has had a shot in his R knee, and that his back has been on and off for years, stated it flares up if he "does too much". Shoulder has been ongoing for last couple of years but pain has been worsening, but would like to focus on R shoulder and back as needed.    Pertinent History  R knee and R shoulder pain. Pt had an injection in R knee (cortisone) about a month ago which has helped substantially. R knee pain started about 6 months ago. R shoulder has been bothering him for tha past year. Raising his arm up into flexion causes R anterior shoulder. Pt also states having low back pain. Aggravating factors include putting much in his yard. Has a hx of  arthritis. 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 His general practitioner suggested that he might have a frozen shoulder.  Pt is R hand dominand.  Pt states that he has not been keeping up with his stretches.  Back pain has been off and on for several years. Had chiropractic treatment which helped.  Has not had surgeries for his R shoulder, R knee and low back. Denies LE paresthesia, or loss of bowel or bladder control.    Limitations  Sitting;Lifting;House hold activities;Standing;Walking    How long can you sit comfortably?     How long can you stand comfortably?     How long can you walk comfortably?     Diagnostic tests  None    Patient Stated Goals  Wants to be able to play golf, make his pain more comfortable. Be able to keep up with grandchildren.    Currently in Pain?  No/denies         Medbridge Access Code [email or web address]     Discussed with Pt about walking his dog, switching to the L arm, or changing dog leads   Manual therapy    Supine A to P to R shoulder grade  3- to 3 with arm in neutral Supine inferior glide R shoulder with arm in neutral   Therapeutic exercise    Seated manually resisted R elbow extension isometrics, 10x5 seconds. R anterior shoulder discomfort slight             Then with gentle distraction 10x2 with 5 second holds       seated R shoulder self inferior mobilization reviewed with Pt, x10sec hold  Supine shoulder flexion with dowel 10x2 Standing shoulder extension isometric with doorknob 10x5 sec Standing shoulder flexion isometric with doorknob 10x5 sec Standing ER isometric with towel at doorway 10x5sec Standing IR isometric with towel at doorway 10x5sec Seated scapular retraction x10     Improved exercise technique, movement at target joints, use of target muscles after mod verbal, visual, tactile cues.       Clinical impression/pt  response: Pt reported some intermittent R shoulder pain with isometrics, that resolved with rest or end of activity. The patient continued to demonstrate decreased R shoulder motion, and tenderness with manual therapy. The patient would benefit from further skilled PT to continue to progress towards goals.     PT Education - 05/14/19 0854    Education provided  Yes    Education Details  ther-ex    Methods  Explanation;Demonstration;Tactile cues;Verbal cues    Comprehension  Verbalized understanding;Returned demonstration       PT Short Term Goals - 05/12/19 1015      PT SHORT TERM GOAL #1   Title  Patient will be independent with his HEP to decrease R shoulder, R knee and low back pain as well as to decrease difficulty reaching, standing up from prolonged sitting, and performing yard work.    Baseline  Pt has started his HEP (05/12/2019)    Time  3    Period  Weeks    Status  New    Target Date  06/05/19        PT Long Term Goals - 05/12/19 1016      PT LONG TERM GOAL #1   Title  Patient will have a decrease in R shoulder pain to 3/10 or less at worst to promote ability to reach, perform yard work, play golf more comfortably.    Baseline  7/10 R shoulder pain at most for the past 3 months (05/12/2019)    Time  8    Period  Weeks    Status  New    Target Date  07/10/19      PT LONG TERM GOAL #2   Title  Pt will have a decrease in R knee pain to 2/10 or less at worst to promote ability to stand up after prolonged sitting, as well as perform yardwork more comfortably.    Baseline  5/10 R knee pain at most for the past 3 months (05/12/2019)    Time  8    Period  Weeks    Status  New    Target Date  07/10/19      PT LONG TERM GOAL #3   Title  Patient will have a decrease in low back pain to 1/10 or less at most to promote ability to perform standing tasks as well as yard work more comfortably.    Baseline  4/10 back pain at most for the past 3 months (05/12/2019)    Time  8     Period  Weeks    Status  New    Target Date  07/10/19  PT LONG TERM GOAL #4   Title  Pt will improve his shoulder FOTO by at least 10 points as a demonstration of improved function.    Baseline  shoulder FOTO: 50 (05/12/2019)    Time  8    Period  Weeks    Status  New    Target Date  07/10/19      PT LONG TERM GOAL #5   Title  Pt will improve R shoulder ER and IR strength by at least 1/2 MMT grade to promote ability to raise his R arm with less pain.    Baseline  R shoulder ER and IR 4/5 (05/12/2019)    Time  8    Period  Weeks    Status  New    Target Date  07/10/19      Additional Long Term Goals   Additional Long Term Goals  Yes      PT LONG TERM GOAL #6   Title  Patient will improve R hip extension and abduction strength by at least 1/2 MMT grade to promote ability to perform closed chain tasks with less knee pain.    Time  8    Period  Weeks    Status  New    Target Date  07/10/19            Plan - 05/14/19 0855    Clinical Impression Statement  Pt reported some intermittent R shoulder pain with isometrics, that resolved with rest or end of activity. The patient continued to demonstrate decreased R shoulder motion, and tenderness with manual therapy. The patient would benefit from further skilled PT to continue to progress towards goals.    Personal Factors and Comorbidities  Age;Past/Current Experience;Time since onset of injury/illness/exacerbation;Comorbidity 1    Comorbidities  Hx of HTN    Examination-Activity Limitations  Bend;Sit;Transfers;Lift    Rehab Potential  Fair    Clinical Impairments Affecting Rehab Potential  (+) motivated, likes to be active; (-) Chronicity of condition, age, multiple areas of pain    PT Frequency  2x / week    PT Duration  8 weeks    PT Treatment/Interventions  Therapeutic activities;Therapeutic exercise;Neuromuscular re-education;Patient/family education;Manual techniques;Dry needling;Spinal Manipulations;Joint  Manipulations;Aquatic Therapy;Electrical Stimulation;Iontophoresis 4mg /ml Dexamethasone;Traction    PT Next Visit Plan  R shoulder joint mobility/distraction, ER, IR, scapular strengthening, posture, hip mobility, trunk, glute strengthening and lumbopelvic and femoral control, manual techniques, modalities PRN    PT Home Exercise Plan  Medbridge Access Code [email or web address]; Pt would like a low back program for home to address strengthening and pain as needed (stretching, core exercises).    Consulted and Agree with Plan of Care  Patient       Patient will benefit from skilled therapeutic intervention in order to improve the following deficits and impairments:  Pain, Postural dysfunction, Impaired UE functional use, Improper body mechanics, Impaired flexibility, Hypomobility, Decreased strength, Decreased range of motion  Visit Diagnosis: Chronic right shoulder pain  Pain in left leg  Chronic pain of right knee  Chronic bilateral low back pain, unspecified whether sciatica present  Muscle weakness (generalized)  Pain in left hip  Acute pain of left knee     Problem List Patient Active Problem List   Diagnosis Date Noted  . Disorder of bursae of shoulder region 12/11/2018  . Contusion of shoulder region 12/11/2018  . Right inguinal hernia 03/20/2014  . Benign essential hypertension 05/15/2013  . Ischemic cardiomyopathy 03/14/2013  . Pure hypercholesterolemia 03/14/2013  . Coronary  artery disease involving native coronary artery of native heart without angina pectoris 03/07/2013  . Diabetes mellitus ([place]) 03/07/2013  . GERD (gastroesophageal reflux disease) 03/07/2013  . STEMI (ST elevation myocardial infarction) ([place]) 03/07/2013    Lieutenant Diego PT, DPT 9:31 AM,05/14/19   [facility] [address] [place], [address] Phone: [phone]   Fax:  [phone]  Name: Gabriel Butler MRN: [id number] Date  of Birth: [birth year]

## 2019-05-19 ENCOUNTER — Ambulatory Visit: Payer: Self-pay | Admitting: Emergency Medicine

## 2019-05-19 ENCOUNTER — Other Ambulatory Visit: Payer: Self-pay

## 2019-05-19 ENCOUNTER — Encounter: Payer: Self-pay | Admitting: Emergency Medicine

## 2019-05-19 VITALS — BP 123/86 | HR 62 | Temp 97.0°F | Resp 12 | Ht 75.0 in | Wt 208.0 lb

## 2019-05-19 DIAGNOSIS — E119 Type 2 diabetes mellitus without complications: Secondary | ICD-10-CM

## 2019-05-19 LAB — POCT GLYCOSYLATED HEMOGLOBIN (HGB A1C): Hemoglobin A1C: 6.5 % — AB (ref 4.0–5.6)

## 2019-05-19 NOTE — Progress Notes (Signed)
A1c Now = 6.5  States he's made diet changes.  AMD

## 2019-05-19 NOTE — Progress Notes (Signed)
S:  Presents to the office for a recheck of his A1c.  Patient states that he saw his cardiologist and was started on Jardiance.  He denies any difficulty taking this medication.  He is also making some lifestyle changes and better choices with eating.  A1c in the office today was 6.5.  We also discussed allergy season and he continues to use Flonase nasal spray and his Nettie pot.  Patient is already gotten his second Covid shot and did not have any problems or side effects.    O:  Lungs are clear bilaterally.  Heart regular rate and rhythm.  No edema noted lower extremity.  Patient has also lost 11 pounds since his last visit in this office.   A: DIABETES mellitus recheck  Plan: Patient is instructed to return in 3 months for lab work.  Patient is continue using his Flonase nasal spray.  Continue increasing activity and making good food choices.

## 2019-05-20 ENCOUNTER — Ambulatory Visit: Payer: 59

## 2019-05-20 DIAGNOSIS — G8929 Other chronic pain: Secondary | ICD-10-CM | POA: Diagnosis not present

## 2019-05-20 DIAGNOSIS — M545 Low back pain: Secondary | ICD-10-CM | POA: Diagnosis not present

## 2019-05-20 DIAGNOSIS — M79605 Pain in left leg: Secondary | ICD-10-CM | POA: Diagnosis not present

## 2019-05-20 DIAGNOSIS — M6281 Muscle weakness (generalized): Secondary | ICD-10-CM | POA: Diagnosis not present

## 2019-05-20 DIAGNOSIS — M25552 Pain in left hip: Secondary | ICD-10-CM | POA: Diagnosis not present

## 2019-05-20 DIAGNOSIS — M25511 Pain in right shoulder: Secondary | ICD-10-CM | POA: Diagnosis not present

## 2019-05-20 DIAGNOSIS — M25561 Pain in right knee: Secondary | ICD-10-CM | POA: Diagnosis not present

## 2019-05-20 DIAGNOSIS — M25562 Pain in left knee: Secondary | ICD-10-CM | POA: Diagnosis not present

## 2019-05-20 NOTE — Therapy (Signed)
[place] [address] [place], [address] Phone: [phone]   Fax:  [phone]  Physical Therapy Treatment  Patient Details  Name: Gabriel Butler MRN: [id number] Date of Birth: 1950-03-10 Referring Provider (PT): Kurtis Bushman, MD   Encounter Date: 05/20/2019  PT End of Session - 05/20/19 1120    Visit Number  3    Number of Visits  17    Date for PT Re-Evaluation  07/10/19    Authorization Type  3    Authorization Time Period  of 10 progress report    PT Start Time  1120    PT Stop Time  1204    PT Time Calculation (min)  44 min    Activity Tolerance  Patient tolerated treatment well    Behavior During Therapy  [facility] for tasks assessed/performed       Past Medical History:  Diagnosis Date  . GERD (gastroesophageal reflux disease)   . Heart attack ([place]) 03/05/2013   Coronary stent placement.  . Hypertension     Past Surgical History:  Procedure Laterality Date  . COLONOSCOPY  2013  . CORONARY ANGIOPLASTY WITH STENT PLACEMENT    . HERNIA REPAIR Right 03/26/14   Direct and indirect hernia noted, large ultra Pro mesh utilized.    There were no vitals filed for this visit.  Subjective Assessment - 05/20/19 1121    Subjective  R shoulder is not bad today. Has not been doing his exercises. Has been busy. Redoing his yard. No pain at rest. 4/10 with raising his R arm.    Pertinent History  R knee and R shoulder pain. Pt had an injection in R knee (cortisone) about a month ago which has helped substantially. R knee pain started about 6 months ago. R shoulder has been bothering him for tha past year. Raising his arm up into flexion causes R anterior shoulder. Pt also states having low back pain. Aggravating factors include putting much in his yard. Has a hx of arthritis. 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  His general practitioner suggested that he might have a frozen shoulder.  Pt is R hand dominand.  Pt states that he has not been keeping up with his stretches.  Back pain has been off and on for several years. Had chiropractic treatment which helped.  Has not had surgeries for his R shoulder, R knee and low back. Denies LE paresthesia, or loss of bowel or bladder control.    Limitations  Sitting;Lifting;House hold activities;Standing;Walking    How long can you sit comfortably?  83min    How long can you stand comfortably?  24min    How long can you walk comfortably?  74mins    Diagnostic tests  None    Patient Stated Goals  Wants to be able to play golf, make his pain more comfortable. Be able to keep up with grandchildren.    Currently in Pain?  Yes    Pain Score  4                                PT Education - 05/20/19 1234    Education provided  Yes    Education Details  ther-ex    Person(s) Educated  Patient    Methods  Explanation;Demonstration;Tactile cues;Verbal cues    Comprehension  Returned demonstration;Verbalized understanding      Objective   no blood pressure problems and no latex band allergies per pt  Pt adds also feeling muscle cramps B lateral thigh as well as medial thigh  Medbridge Access Code [email or web address]   Manual therapy   Supine inferior glide R GH joint grade 3  Supine inferior glide R GH joint with R shoulder abduction with PT   Supine with R shoulder in flexion   STM R teres major muscle    Supine STM R pectoralis muscle to decrease muscle and fascial tension in abduction      Therapeutic exercise    Supine AAROM R shoulder  Flexion 10x2  scaption 10x2  Abduction 10x2  Seated R shoulder flexion with cues for scapular positioning.    Improved exercise technique, movement at target joints, use of target muscles after mod verbal, visual, tactile cues.     Response to treatment Decreased R shoulder pain with  flexion after shoulder distraction related treatment.     Clinical impression Improved supine R shoulder flexion and abduction AAROM after treatment to promote inferior glenohumeral joint mobility as well as decreasing pectoralis and teres major muscle tension and fascial restrictions. Difficulty with R shoulder flexion and abduction against gravity secondary to poor mechanics and stiffness. Improved R shoulder flexion AROM with PT assist for proper R scapular positioning. Pt tolerated session well without aggravation of symptoms. Pt will benefit from continued skilled physical therapy services to decrease pain, stiffness, improve ROM, strength and function.      PT Short Term Goals - 05/12/19 1015      PT SHORT TERM GOAL #1   Title  Patient will be independent with his HEP to decrease R shoulder, R knee and low back pain as well as to decrease difficulty reaching, standing up from prolonged sitting, and performing yard work.    Baseline  Pt has started his HEP (05/12/2019)    Time  3    Period  Weeks    Status  New    Target Date  06/05/19        PT Long Term Goals - 05/12/19 1016      PT LONG TERM GOAL #1   Title  Patient will have a decrease in R shoulder pain to 3/10 or less at worst to promote ability to reach, perform yard work, play golf more comfortably.    Baseline  7/10 R shoulder pain at most for the past 3 months (05/12/2019)    Time  8    Period  Weeks    Status  New    Target Date  07/10/19      PT LONG TERM GOAL #2   Title  Pt will have a decrease in R knee pain to 2/10 or less at worst to promote ability to stand up after prolonged sitting, as well as perform yardwork more comfortably.    Baseline  5/10 R knee pain at most for the past 3 months (05/12/2019)    Time  8    Period  Weeks    Status  New    Target Date  07/10/19      PT LONG TERM GOAL #3   Title  Patient will have a decrease in low back pain to 1/10 or less at most to promote ability to perform  standing tasks as well as yard work more comfortably.    Baseline  4/10 back pain at most for the past 3 months (05/12/2019)  Time  8    Period  Weeks    Status  New    Target Date  07/10/19      PT LONG TERM GOAL #4   Title  Pt will improve his shoulder FOTO by at least 10 points as a demonstration of improved function.    Baseline  shoulder FOTO: 50 (05/12/2019)    Time  8    Period  Weeks    Status  New    Target Date  07/10/19      PT LONG TERM GOAL #5   Title  Pt will improve R shoulder ER and IR strength by at least 1/2 MMT grade to promote ability to raise his R arm with less pain.    Baseline  R shoulder ER and IR 4/5 (05/12/2019)    Time  8    Period  Weeks    Status  New    Target Date  07/10/19      Additional Long Term Goals   Additional Long Term Goals  Yes      PT LONG TERM GOAL #6   Title  Patient will improve R hip extension and abduction strength by at least 1/2 MMT grade to promote ability to perform closed chain tasks with less knee pain.    Time  8    Period  Weeks    Status  New    Target Date  07/10/19            Plan - 05/20/19 1230    Clinical Impression Statement  Improved supine R shoulder flexion and abduction AAROM after treatment to promote inferior glenohumeral joint mobility as well as decreasing pectoralis and teres major muscle tension and fascial restrictions. Difficulty with R shoulder flexion and abduction against gravity secondary to poor mechanics and stiffness. Improved R shoulder flexion AROM with PT assist for proper R scapular positioning. Pt tolerated session well without aggravation of symptoms. Pt will benefit from continued skilled physical therapy services to decrease pain, stiffness, improve ROM, strength and function.    Personal Factors and Comorbidities  Age;Past/Current Experience;Time since onset of injury/illness/exacerbation;Comorbidity 1    Comorbidities  Hx of HTN    Examination-Activity Limitations   Bend;Sit;Transfers;Lift    Rehab Potential  Fair    Clinical Impairments Affecting Rehab Potential  (+) motivated, likes to be active; (-) Chronicity of condition, age, multiple areas of pain    PT Frequency  2x / week    PT Duration  8 weeks    PT Treatment/Interventions  Therapeutic activities;Therapeutic exercise;Neuromuscular re-education;Patient/family education;Manual techniques;Dry needling;Spinal Manipulations;Joint Manipulations;Aquatic Therapy;Electrical Stimulation;Iontophoresis 4mg /ml Dexamethasone;Traction    PT Next Visit Plan  R shoulder joint mobility/distraction, ER, IR, scapular strengthening, posture, hip mobility, trunk, glute strengthening and lumbopelvic and femoral control, manual techniques, modalities PRN    PT Home Exercise Plan  Medbridge Access Code [email or web address]; Pt would like a low back program for home to address strengthening and pain as needed (stretching, core exercises).    Consulted and Agree with Plan of Care  Patient       Patient will benefit from skilled therapeutic intervention in order to improve the following deficits and impairments:  Pain, Postural dysfunction, Impaired UE functional use, Improper body mechanics, Impaired flexibility, Hypomobility, Decreased strength, Decreased range of motion  Visit Diagnosis: Chronic right shoulder pain  Muscle weakness (generalized)     Problem List Patient Active Problem List   Diagnosis Date Noted  . Disorder of bursae of shoulder region  12/11/2018  . Contusion of shoulder region 12/11/2018  . Right inguinal hernia 03/20/2014  . Benign essential hypertension 05/15/2013  . Ischemic cardiomyopathy 03/14/2013  . Pure hypercholesterolemia 03/14/2013  . Coronary artery disease involving native coronary artery of native heart without angina pectoris 03/07/2013  . Diabetes mellitus (HCC) 03/07/2013  . GERD (gastroesophageal reflux disease) 03/07/2013  . STEMI (ST elevation myocardial infarction) (HCC) 03/07/2013     Loralyn Freshwater PT, DPT   05/20/2019, 12:34 PM  [facility] [address], [place], [address] Phone: [phone]   Fax:  [phone]  Name: Gabriel Butler MRN: [id number] Date of Birth: [birth year]

## 2019-05-22 ENCOUNTER — Ambulatory Visit: Payer: 59

## 2019-05-22 ENCOUNTER — Other Ambulatory Visit: Payer: Self-pay

## 2019-05-22 DIAGNOSIS — M25561 Pain in right knee: Secondary | ICD-10-CM | POA: Diagnosis not present

## 2019-05-22 DIAGNOSIS — G8929 Other chronic pain: Secondary | ICD-10-CM | POA: Diagnosis not present

## 2019-05-22 DIAGNOSIS — M79605 Pain in left leg: Secondary | ICD-10-CM | POA: Diagnosis not present

## 2019-05-22 DIAGNOSIS — M25562 Pain in left knee: Secondary | ICD-10-CM | POA: Diagnosis not present

## 2019-05-22 DIAGNOSIS — M25552 Pain in left hip: Secondary | ICD-10-CM | POA: Diagnosis not present

## 2019-05-22 DIAGNOSIS — M25511 Pain in right shoulder: Secondary | ICD-10-CM | POA: Diagnosis not present

## 2019-05-22 DIAGNOSIS — M545 Low back pain: Secondary | ICD-10-CM | POA: Diagnosis not present

## 2019-05-22 DIAGNOSIS — M6281 Muscle weakness (generalized): Secondary | ICD-10-CM

## 2019-05-22 NOTE — Therapy (Signed)
[facility] [address] [place], [address] Phone: [phone]   Fax:  [phone]  Physical Therapy Treatment  Patient Details  Name: Gabriel Butler MRN: [id number] Date of Birth: 1950/10/08 Referring Provider (PT): Cassell Smiles, MD   Encounter Date: 05/22/2019  PT End of Session - 05/22/19 1702    Visit Number  4    Number of Visits  17    Date for PT Re-Evaluation  07/10/19    Authorization Type  4    Authorization Time Period  of 10 progress report    PT Start Time  1703    PT Stop Time  1749    PT Time Calculation (min)  46 min    Activity Tolerance  Patient tolerated treatment well    Behavior During Therapy  [facility] for tasks assessed/performed       Past Medical History:  Diagnosis Date  . GERD (gastroesophageal reflux disease)   . Heart attack (HCC) 03/05/2013   Coronary stent placement.  . Hypertension     Past Surgical History:  Procedure Laterality Date  . COLONOSCOPY  2013  . CORONARY ANGIOPLASTY WITH STENT PLACEMENT    . HERNIA REPAIR Right 03/26/14   Direct and indirect hernia noted, large ultra Pro mesh utilized.    There were no vitals filed for this visit.  Subjective Assessment - 05/22/19 1703    Subjective  R shoulder feels some better. 5/10 R shoulder pain with flexion    Pertinent History  R knee and R shoulder pain. Pt had an injection in R knee (cortisone) about a month ago which has helped substantially. R knee pain started about 6 months ago. R shoulder has been bothering him for tha past year. Raising his arm up into flexion causes R anterior shoulder. Pt also states having low back pain. Aggravating factors include putting much in his yard. Has a hx of arthritis. 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 His general practitioner suggested that he might have a frozen shoulder.  Pt is  R hand dominand.  Pt states that he has not been keeping up with his stretches.  Back pain has been off and on for several years. Had chiropractic treatment which helped.  Has not had surgeries for his R shoulder, R knee and low back. Denies LE paresthesia, or loss of bowel or bladder control.    Limitations  Sitting;Lifting;House hold activities;Standing;Walking    How long can you sit comfortably?     How long can you stand comfortably?     How long can you walk comfortably?     Diagnostic tests  None    Patient Stated Goals  Wants to be able to play golf, make his pain more comfortable. Be able to keep up with grandchildren.    Currently in Pain?  Yes    Pain Score  5                                PT Education - 05/22/19 1740    Education provided  Yes    Education Details  ther-ex    Person(s) Educated  Patient    Methods  Explanation;Demonstration;Tactile cues;Verbal cues    Comprehension  Returned demonstration;Verbalized understanding         Objective   no blood  pressure problems and no latex band allergies per pt  Pt adds also feeling muscle cramps B lateral thigh as well as medial thigh  MedbridgeAccess Code [email or web address]   Manual therapy  Supine with R shoulder in flexion              STM R teres major muscle    Supine STM R pectoralis muscle to decrease muscle and fascial tension  Supine inferior glide R GH joint grade 3 to 3+ with shoulder in abduction as well as flexion  Supine posterior glide R GH joint with R shoulder abduction grade 3 to 3+  Supine lateral glide R shoulder joint in flexion grade 3 to 3+       Therapeutic exercise  Supine R shoulder flexion 10x  Supine R shoulder ER isometrics in flexion 10x5 seconds   Supine R shoulder ER AAROM in different degrees of scaption 10x3  Seated R shoulder flexion with PT assist for scapular retraction 10x  deccreased pain  Diffiulty with  scapular retraction mechanics. Work on scapular retraction next visit.     Improved exercise technique, movement at target joints, use of target muscles after mod verbal, visual, tactile cues.    Response to treatment Pt tolerated session well without aggravation of symptoms    Clinical impression Pt demonstrates difficulty with scapular retraction mechanics. PT assist needed for proper scapular position with R shoulder flexion resulting in decreased pain and improve motion. Improved shoulder ER with ROM exercises. Stiff inferior and posterior R glenohumeral joint, mobilization performed to help address. Pt tolerated session well without aggravation of symptoms. Pt will benefit from continued skilled physical therapy services to decrease pain, improve ROM, strength and function.     PT Short Term Goals - 05/12/19 1015      PT SHORT TERM GOAL #1   Title  Patient will be independent with his HEP to decrease R shoulder, R knee and low back pain as well as to decrease difficulty reaching, standing up from prolonged sitting, and performing yard work.    Baseline  Pt has started his HEP (05/12/2019)    Time  3    Period  Weeks    Status  New    Target Date  06/05/19        PT Long Term Goals - 05/12/19 1016      PT LONG TERM GOAL #1   Title  Patient will have a decrease in R shoulder pain to 3/10 or less at worst to promote ability to reach, perform yard work, play golf more comfortably.    Baseline  7/10 R shoulder pain at most for the past 3 months (05/12/2019)    Time  8    Period  Weeks    Status  New    Target Date  07/10/19      PT LONG TERM GOAL #2   Title  Pt will have a decrease in R knee pain to 2/10 or less at worst to promote ability to stand up after prolonged sitting, as well as perform yardwork more comfortably.    Baseline  5/10 R knee pain at most for the past 3 months (05/12/2019)    Time  8    Period  Weeks    Status  New    Target Date  07/10/19      PT  LONG TERM GOAL #3   Title  Patient will have a decrease in low back pain to 1/10 or less at most to  promote ability to perform standing tasks as well as yard work more comfortably.    Baseline  4/10 back pain at most for the past 3 months (05/12/2019)    Time  8    Period  Weeks    Status  New    Target Date  07/10/19      PT LONG TERM GOAL #4   Title  Pt will improve his shoulder FOTO by at least 10 points as a demonstration of improved function.    Baseline  shoulder FOTO: 50 (05/12/2019)    Time  8    Period  Weeks    Status  New    Target Date  07/10/19      PT LONG TERM GOAL #5   Title  Pt will improve R shoulder ER and IR strength by at least 1/2 MMT grade to promote ability to raise his R arm with less pain.    Baseline  R shoulder ER and IR 4/5 (05/12/2019)    Time  8    Period  Weeks    Status  New    Target Date  07/10/19      Additional Long Term Goals   Additional Long Term Goals  Yes      PT LONG TERM GOAL #6   Title  Patient will improve R hip extension and abduction strength by at least 1/2 MMT grade to promote ability to perform closed chain tasks with less knee pain.    Time  8    Period  Weeks    Status  New    Target Date  07/10/19            Plan - 05/22/19 1848    Clinical Impression Statement  Pt demonstrates difficulty with scapular retraction mechanics. PT assist needed for proper scapular position with R shoulder flexion resulting in decreased pain and improve motion. Improved shoulder ER with ROM exercises. Stiff inferior and posterior R glenohumeral joint, mobilization performed to help address. Pt tolerated session well without aggravation of symptoms. Pt will benefit from continued skilled physical therapy services to decrease pain, improve ROM, strength and function.    Personal Factors and Comorbidities  Age;Past/Current Experience;Time since onset of injury/illness/exacerbation;Comorbidity 1    Comorbidities  Hx of HTN    Examination-Activity  Limitations  Bend;Sit;Transfers;Lift    Rehab Potential  Fair    Clinical Impairments Affecting Rehab Potential  (+) motivated, likes to be active; (-) Chronicity of condition, age, multiple areas of pain    PT Frequency  2x / week    PT Duration  8 weeks    PT Treatment/Interventions  Therapeutic activities;Therapeutic exercise;Neuromuscular re-education;Patient/family education;Manual techniques;Dry needling;Spinal Manipulations;Joint Manipulations;Aquatic Therapy;Electrical Stimulation;Iontophoresis 4mg /ml Dexamethasone;Traction    PT Next Visit Plan  R shoulder joint mobility/distraction, ER, IR, scapular strengthening, posture, hip mobility, trunk, glute strengthening and lumbopelvic and femoral control, manual techniques, modalities PRN    PT Home Exercise Plan  Medbridge Access Code [email or web address]; Pt would like a low back program for home to address strengthening and pain as needed (stretching, core exercises).    Consulted and Agree with Plan of Care  Patient       Patient will benefit from skilled therapeutic intervention in order to improve the following deficits and impairments:  Pain, Postural dysfunction, Impaired UE functional use, Improper body mechanics, Impaired flexibility, Hypomobility, Decreased strength, Decreased range of motion  Visit Diagnosis: Chronic right shoulder pain  Muscle weakness (generalized)     Problem List  Patient Active Problem List   Diagnosis Date Noted  . Disorder of bursae of shoulder region 12/11/2018  . Contusion of shoulder region 12/11/2018  . Right inguinal hernia 03/20/2014  . Benign essential hypertension 05/15/2013  . Ischemic cardiomyopathy 03/14/2013  . Pure hypercholesterolemia 03/14/2013  . Coronary artery disease involving native coronary artery of native heart without angina pectoris 03/07/2013  . Diabetes mellitus (HCC) 03/07/2013  . GERD (gastroesophageal reflux disease) 03/07/2013  . STEMI (ST elevation myocardial infarction)  (HCC) 03/07/2013    Loralyn Freshwater PT, DPT   05/22/2019, 6:57 PM  [facility] SPORTS MEDICINE [address] [place], [address] Phone: [phone]   Fax:  [phone]  Name: Gabriel Butler MRN: [id number] Date of Birth: [birth year]

## 2019-05-26 ENCOUNTER — Other Ambulatory Visit: Payer: Self-pay | Admitting: Physician Assistant

## 2019-05-26 ENCOUNTER — Other Ambulatory Visit: Payer: Self-pay

## 2019-05-26 ENCOUNTER — Ambulatory Visit: Payer: 59

## 2019-05-26 DIAGNOSIS — M6281 Muscle weakness (generalized): Secondary | ICD-10-CM | POA: Diagnosis not present

## 2019-05-26 DIAGNOSIS — M545 Low back pain: Secondary | ICD-10-CM | POA: Diagnosis not present

## 2019-05-26 DIAGNOSIS — K219 Gastro-esophageal reflux disease without esophagitis: Secondary | ICD-10-CM

## 2019-05-26 DIAGNOSIS — G8929 Other chronic pain: Secondary | ICD-10-CM

## 2019-05-26 DIAGNOSIS — M79605 Pain in left leg: Secondary | ICD-10-CM | POA: Diagnosis not present

## 2019-05-26 DIAGNOSIS — M25561 Pain in right knee: Secondary | ICD-10-CM | POA: Diagnosis not present

## 2019-05-26 DIAGNOSIS — M25562 Pain in left knee: Secondary | ICD-10-CM | POA: Diagnosis not present

## 2019-05-26 DIAGNOSIS — M25511 Pain in right shoulder: Secondary | ICD-10-CM | POA: Diagnosis not present

## 2019-05-26 DIAGNOSIS — M25552 Pain in left hip: Secondary | ICD-10-CM | POA: Diagnosis not present

## 2019-05-26 NOTE — Therapy (Signed)
[facility] [facility]TS [facility]IC[facility] [address], [place], [address] Phone: [phone]   Fax:  [phone]  Physical Therapy Treatment  Patient Details  Name: Gabriel Butler MRN: [id number] Date of Birth: October 25, 1950 Referring Provider (PT): Cassell Smiles, MD   Encounter Date: 05/26/2019  PT End of Session - 05/26/19 0849    Visit Number  5    Number of Visits  17    Date for PT Re-Evaluation  07/10/19    Authorization Type  5    Authorization Time Period  of 10 progress report    PT Start Time  0849    PT Stop Time  0932    PT Time Calculation (min)  43 min    Activity Tolerance  Patient tolerated treatment well    Behavior During Therapy  [facility] for tasks assessed/performed       Past Medical History:  Diagnosis Date  . GERD (gastroesophageal reflux disease)   . Heart attack (HCC) 03/05/2013   Coronary stent placement.  . Hypertension     Past Surgical History:  Procedure Laterality Date  . COLONOSCOPY  2013  . CORONARY ANGIOPLASTY WITH STENT PLACEMENT    . HERNIA REPAIR Right 03/26/14   Direct and indirect hernia noted, large ultra Pro mesh utilized.    There were no vitals filed for this visit.  Subjective Assessment - 05/26/19 0850    Subjective  R shoulder is better. Almost took it out to the golf course but didn't. 3-4/10 with flexion currently.    Pertinent History  R knee and R shoulder pain. Pt had an injection in R knee (cortisone) about a month ago which has helped substantially. R knee pain started about 6 months ago. R shoulder has been bothering him for tha past year. Raising his arm up into flexion causes R anterior shoulder. Pt also states having low back pain. Aggravating factors include putting much in his yard. Has a hx of arthritis. 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 His general practitioner suggested that  he might have a frozen shoulder.  Pt is R hand dominand.  Pt states that he has not been keeping up with his stretches.  Back pain has been off and on for several years. Had chiropractic treatment which helped.  Has not had surgeries for his R shoulder, R knee and low back. Denies LE paresthesia, or loss of bowel or bladder control.    Limitations  Sitting;Lifting;House hold activities;Standing;Walking    How long can you sit comfortably?     How long can you stand comfortably?     How long can you walk comfortably?     Diagnostic tests  None    Patient Stated Goals  Wants to be able to play golf, make his pain more comfortable. Be able to keep up with grandchildren.    Currently in Pain?  Yes    Pain Score  4                                PT Education - 05/26/19 0945    Education provided  Yes    Education Details  ther-ex, HEP    Person(s) Educated  Patient    Methods  Explanation;Demonstration;Tactile cues;Verbal cues;Handout    Comprehension  Verbalized understanding;Returned demonstration  Objective   no blood pressure problems and no latex band allergies per pt  Pt adds also feeling muscle cramps B lateral thigh as well as medial thigh  MedbridgeAccess Code [email or web address]   Manual therapy  L S/L R scapular mobilization  Supine inferior glide R GH joint grade 3 to 3+ with shoulder in abduction as well as flexion  Supine with R shoulder in flexion  STM R teres major muscle    Supine STM R pectoralis muscle to decrease muscle and fascial tension  Supine lateral glide R shoulder joint in flexion grade 3 to 3+  Feels good per pt   Supine STM R subscapularis/teres major area   Therapeutic exercise  L S/L R shoulder ER 3. R GH crepitus    Supine R shoulder ER AAROM in different degrees of scaption 10x3    Supine R shoulder flexion AROM with emphasis on proper scapular control 10x3   Seated R  shoulder flexion with PT assist for scapular retraction 4x             deccreased pain      Improved exercise technique, movement at target joints, use of target muscles after mod verbal, visual, tactile cues.    Response to treatment Pt tolerated session well without aggravation of symptoms    Clinical impression Continued working on R glenohumeral joint mobilization and manual therapy to decrease soft tissue restrictions at R shoulder to promote proper movement and decrease stiffness as well as scapular mechanics with raising his R arm. Demonstrates limited R shoulder ER during flexion as well secondary to joint stiffness. Pt tolerated session well without aggravation of symptoms. Pt will benefit from continued skilled physical therapy services to decrease pain, stiffness, improve ROM, strength, and function.       PT Short Term Goals - 05/12/19 1015      PT SHORT TERM GOAL #1   Title  Patient will be independent with his HEP to decrease R shoulder, R knee and low back pain as well as to decrease difficulty reaching, standing up from prolonged sitting, and performing yard work.    Baseline  Pt has started his HEP (05/12/2019)    Time  3    Period  Weeks    Status  New    Target Date  06/05/19        PT Long Term Goals - 05/12/19 1016      PT LONG TERM GOAL #1   Title  Patient will have a decrease in R shoulder pain to 3/10 or less at worst to promote ability to reach, perform yard work, play golf more comfortably.    Baseline  7/10 R shoulder pain at most for the past 3 months (05/12/2019)    Time  8    Period  Weeks    Status  New    Target Date  07/10/19      PT LONG TERM GOAL #2   Title  Pt will have a decrease in R knee pain to 2/10 or less at worst to promote ability to stand up after prolonged sitting, as well as perform yardwork more comfortably.    Baseline  5/10 R knee pain at most for the past 3 months (05/12/2019)    Time  8    Period  Weeks    Status   New    Target Date  07/10/19      PT LONG TERM GOAL #3   Title  Patient will have a  decrease in low back pain to 1/10 or less at most to promote ability to perform standing tasks as well as yard work more comfortably.    Baseline  4/10 back pain at most for the past 3 months (05/12/2019)    Time  8    Period  Weeks    Status  New    Target Date  07/10/19      PT LONG TERM GOAL #4   Title  Pt will improve his shoulder FOTO by at least 10 points as a demonstration of improved function.    Baseline  shoulder FOTO: 50 (05/12/2019)    Time  8    Period  Weeks    Status  New    Target Date  07/10/19      PT LONG TERM GOAL #5   Title  Pt will improve R shoulder ER and IR strength by at least 1/2 MMT grade to promote ability to raise his R arm with less pain.    Baseline  R shoulder ER and IR 4/5 (05/12/2019)    Time  8    Period  Weeks    Status  New    Target Date  07/10/19      Additional Long Term Goals   Additional Long Term Goals  Yes      PT LONG TERM GOAL #6   Title  Patient will improve R hip extension and abduction strength by at least 1/2 MMT grade to promote ability to perform closed chain tasks with less knee pain.    Time  8    Period  Weeks    Status  New    Target Date  07/10/19            Plan - 05/26/19 0942    Clinical Impression Statement  Continued working on R glenohumeral joint mobilization and manual therapy to decrease soft tissue restrictions at R shoulder to promote proper movement and decrease stiffness as well as scapular mechanics with raising his R arm. Demonstrates limited R shoulder ER during flexion as well secondary to joint stiffness. Pt tolerated session well without aggravation of symptoms. Pt will benefit from continued skilled physical therapy services to decrease pain, stiffness, improve ROM, strength, and function.    Personal Factors and Comorbidities  Age;Past/Current Experience;Time since onset of injury/illness/exacerbation;Comorbidity 1     Comorbidities  Hx of HTN    Examination-Activity Limitations  Bend;Sit;Transfers;Lift    Rehab Potential  Fair    Clinical Impairments Affecting Rehab Potential  (+) motivated, likes to be active; (-) Chronicity of condition, age, multiple areas of pain    PT Frequency  2x / week    PT Duration  8 weeks    PT Treatment/Interventions  Therapeutic activities;Therapeutic exercise;Neuromuscular re-education;Patient/family education;Manual techniques;Dry needling;Spinal Manipulations;Joint Manipulations;Aquatic Therapy;Electrical Stimulation;Iontophoresis 4mg /ml Dexamethasone;Traction    PT Next Visit Plan  R shoulder joint mobility/distraction, ER, IR, scapular strengthening, posture, hip mobility, trunk, glute strengthening and lumbopelvic and femoral control, manual techniques, modalities PRN    PT Home Exercise Plan  Medbridge Access Code [email or web address]; Pt would like a low back program for home to address strengthening and pain as needed (stretching, core exercises).    Consulted and Agree with Plan of Care  Patient       Patient will benefit from skilled therapeutic intervention in order to improve the following deficits and impairments:  Pain, Postural dysfunction, Impaired UE functional use, Improper body mechanics, Impaired flexibility, Hypomobility, Decreased strength, Decreased range of  motion  Visit Diagnosis: Chronic right shoulder pain  Muscle weakness (generalized)     Problem List Patient Active Problem List   Diagnosis Date Noted  . Disorder of bursae of shoulder region 12/11/2018  . Contusion of shoulder region 12/11/2018  . Right inguinal hernia 03/20/2014  . Benign essential hypertension 05/15/2013  . Ischemic cardiomyopathy 03/14/2013  . Pure hypercholesterolemia 03/14/2013  . Coronary artery disease involving native coronary artery of native heart without angina pectoris 03/07/2013  . Diabetes mellitus (HCC) 03/07/2013  . GERD (gastroesophageal reflux disease)  03/07/2013  . STEMI (ST elevation myocardial infarction) (HCC) 03/07/2013    Loralyn Freshwater PT, DPT   05/26/2019, 9:45 AM  [facility] PHYSICAL AND SPORTS MEDICINE [address], [place], [address] Phone: [phone]   Fax:  [phone]  Name: Gabriel Butler MRN: [id number] Date of Birth: 05-Apr-1950

## 2019-05-26 NOTE — Patient Instructions (Signed)
Access Code: [email or web address] URL: [email or web address] Date: 05/26/2019 Prepared by: Loralyn Freshwater  Exercises Seated Shoulder Inferior Glide - 1 x daily - 7 x weekly - 1 sets - 10 reps - 5 seconds hold Standing Isometric Shoulder Extension with Doorway - Arm Bent - 1 x daily - 7 x weekly - 1 sets - 10 reps - 5 hold Standing Isometric Shoulder Flexion with Doorway - Arm Bent - 1 x daily - 7 x weekly - 1 sets - 10 reps - 5 hold Standing Isometric Shoulder External Rotation with Doorway - 1 x daily - 7 x weekly - 10 reps - 3 sets Standing Isometric Shoulder Internal Rotation at Doorway - 1 x daily - 7 x weekly - 10 reps - 3 sets Seated Scapular Retraction - 1 x daily - 7 x weekly - 2 sets - 10 reps Supine Shoulder Flexion Extension Full Range AROM - 3 x daily - 7 x weekly - 3 sets - 10 reps

## 2019-05-28 ENCOUNTER — Ambulatory Visit: Payer: 59

## 2019-05-28 ENCOUNTER — Other Ambulatory Visit: Payer: Self-pay

## 2019-05-28 DIAGNOSIS — M25511 Pain in right shoulder: Secondary | ICD-10-CM

## 2019-05-28 DIAGNOSIS — M6281 Muscle weakness (generalized): Secondary | ICD-10-CM

## 2019-05-28 DIAGNOSIS — G8929 Other chronic pain: Secondary | ICD-10-CM | POA: Diagnosis not present

## 2019-05-28 DIAGNOSIS — M25562 Pain in left knee: Secondary | ICD-10-CM | POA: Diagnosis not present

## 2019-05-28 DIAGNOSIS — M545 Low back pain: Secondary | ICD-10-CM | POA: Diagnosis not present

## 2019-05-28 DIAGNOSIS — M25552 Pain in left hip: Secondary | ICD-10-CM | POA: Diagnosis not present

## 2019-05-28 DIAGNOSIS — M25561 Pain in right knee: Secondary | ICD-10-CM | POA: Diagnosis not present

## 2019-05-28 DIAGNOSIS — M79605 Pain in left leg: Secondary | ICD-10-CM | POA: Diagnosis not present

## 2019-05-28 NOTE — Patient Instructions (Addendum)
Access Code: [email or web address] URL: [email or web address] Date: 05/28/2019 Prepared by: Loralyn Freshwater  Exercises Seated Shoulder Inferior Glide - 1 x daily - 7 x weekly - 1 sets - 10 reps - 5 seconds hold Standing Isometric Shoulder Extension with Doorway - Arm Bent - 1 x daily - 7 x weekly - 1 sets - 10 reps - 5 hold Standing Isometric Shoulder Flexion with Doorway - Arm Bent - 1 x daily - 7 x weekly - 1 sets - 10 reps - 5 hold Standing Isometric Shoulder External Rotation with Doorway - 1 x daily - 7 x weekly - 10 reps - 3 sets Standing Isometric Shoulder Internal Rotation at Doorway - 1 x daily - 7 x weekly - 10 reps - 3 sets Seated Scapular Retraction - 1 x daily - 7 x weekly - 2 sets - 10 reps Supine Shoulder Flexion Extension Full Range AROM - 3 x daily - 7 x weekly - 3 sets - 10 reps Seated Thoracic Lumbar Extension - 1 x daily - 7 x weekly - 3 sets - 10 reps - 5 seconds hold     Sitting with lumbar support to promote thoracic extension   Pt was recommended to perform whenever sitting. Pt verbalized understanding.

## 2019-05-28 NOTE — Therapy (Signed)
[place] [facility] PHYSICAL AND SPORTS MEDICINE [address], [place], [address] Phone: [phone]   Fax:  [phone]  Physical Therapy Treatment  Patient Details  Name: Gabriel Butler MRN: [id number] Date of Birth: 1950/06/07 Referring Provider (PT): Cassell Smiles, MD   Encounter Date: 05/28/2019  PT End of Session - 05/28/19 0848    Visit Number  6    Number of Visits  17    Date for PT Re-Evaluation  07/10/19    Authorization Type  6    Authorization Time Period  of 10 progress report    PT Start Time  0848    PT Stop Time  0949    PT Time Calculation (min)  61 min    Activity Tolerance  Patient tolerated treatment well    Behavior During Therapy  [facility] for tasks assessed/performed       Past Medical History:  Diagnosis Date  . GERD (gastroesophageal reflux disease)   . Heart attack (HCC) 03/05/2013   Coronary stent placement.  . Hypertension     Past Surgical History:  Procedure Laterality Date  . COLONOSCOPY  2013  . CORONARY ANGIOPLASTY WITH STENT PLACEMENT    . HERNIA REPAIR Right 03/26/14   Direct and indirect hernia noted, large ultra Pro mesh utilized.    There were no vitals filed for this visit.  Subjective Assessment - 05/28/19 0849    Subjective  R shoulder is ok. 3-4/10 when raising his R arm up into flexion. Comfort level in abduction position has improved.    Pertinent History  R knee and R shoulder pain. Pt had an injection in R knee (cortisone) about a month ago which has helped substantially. R knee pain started about 6 months ago. R shoulder has been bothering him for tha past year. Raising his arm up into flexion causes R anterior shoulder. Pt also states having low back pain. Aggravating factors include putting much in his yard. Has a hx of arthritis. 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 His general practitioner  suggested that he might have a frozen shoulder.  Pt is R hand dominand.  Pt states that he has not been keeping up with his stretches.  Back pain has been off and on for several years. Had chiropractic treatment which helped.  Has not had surgeries for his R shoulder, R knee and low back. Denies LE paresthesia, or loss of bowel or bladder control.    Limitations  Sitting;Lifting;House hold activities;Standing;Walking    How long can you sit comfortably?     How long can you stand comfortably?     How long can you walk comfortably?     Diagnostic tests  None    Patient Stated Goals  Wants to be able to play golf, make his pain more comfortable. Be able to keep up with grandchildren.    Currently in Pain?  Yes    Pain Score  4                                PT Education - 05/28/19 0942    Education provided  Yes    Education Details  ther-ex, HEP    Person(s) Educated  Patient    Methods  Explanation;Demonstration;Tactile cues;Verbal cues;Handout    Comprehension  Returned demonstration;Verbalized understanding  Objective   no blood pressure problems and no latex band allergies per pt  Pt adds also feeling muscle cramps B lateral thigh as well as medial thigh  MedbridgeAccess Code [email or web address]   Manual therapy  L S/L R scapular mobilization  Supine inferior glide R GH joint grade 3to 3+ with shoulder in abduction as well as flexion  Supine with R shoulder in flexion  STM R teres major muscle    Supine STM R pectoralis muscle to decrease muscle and fascial tension  Supine lateral glide R shoulder joint in flexion grade 3 to 3+  Supine STM R subscapularis/teres major area   Supine R shoulder ER PROM in scaption   Supine R shoulder adduction mobilization then stretch   Supine R shoulder IR in scaption     Therapeutic exercise   Supine R shoulder flexion AROM with PT assist for scapular  positioning 10x2  Then with PT manual resistance 10x  supine shoulder extension PT resisted 10x  Seated manually resisted scapular retraction targeting lower traps 10x5 seconds  Difficulty with scapular mechanics, weak lower trap and middle trap muscles  Scapular retraction 10x5 seconds  Sitting with lumbar support to promote thoracic extension   Pt was recommended to perform whenever sitting. Pt verbalized understanding.     Seated thoracic extension over chair 10x5 seconds to promote thoracic extension, scapular retraction, and decrease stress to low back   Work on reclined shoulder AROM with scapular control next visit if appropriate     Improved exercise technique, movement at target joints, use of target muscles after mod verbal, visual, tactile cues.    Response to treatment Pt tolerated session well without aggravation of symptoms    Clinical impression Continued working on improving R shoulder joint mobility, decreasing soft tissue restrictions, and promoting glenohumeral mechanics. Difficulty with proper scapular positioning when raising his arm in both flexion, and abduction due to both weakness and poor motor control. PT manual assist needed to promote posterior tipping and scapular retraction to decrease impingement. Pt tolerated session well without aggravation of symptoms. Pt will benefit from continued skilled physical therapy services to decrease stiffness and pain, improve ROM, strength, motor control, and function.       PT Short Term Goals - 05/12/19 1015      PT SHORT TERM GOAL #1   Title  Patient will be independent with his HEP to decrease R shoulder, R knee and low back pain as well as to decrease difficulty reaching, standing up from prolonged sitting, and performing yard work.    Baseline  Pt has started his HEP (05/12/2019)    Time  3    Period  Weeks    Status  New    Target Date  06/05/19        PT Long Term Goals - 05/12/19 1016       PT LONG TERM GOAL #1   Title  Patient will have a decrease in R shoulder pain to 3/10 or less at worst to promote ability to reach, perform yard work, play golf more comfortably.    Baseline  7/10 R shoulder pain at most for the past 3 months (05/12/2019)    Time  8    Period  Weeks    Status  New    Target Date  07/10/19      PT LONG TERM GOAL #2   Title  Pt will have a decrease in R knee pain to 2/10 or less at worst to  promote ability to stand up after prolonged sitting, as well as perform yardwork more comfortably.    Baseline  5/10 R knee pain at most for the past 3 months (05/12/2019)    Time  8    Period  Weeks    Status  New    Target Date  07/10/19      PT LONG TERM GOAL #3   Title  Patient will have a decrease in low back pain to 1/10 or less at most to promote ability to perform standing tasks as well as yard work more comfortably.    Baseline  4/10 back pain at most for the past 3 months (05/12/2019)    Time  8    Period  Weeks    Status  New    Target Date  07/10/19      PT LONG TERM GOAL #4   Title  Pt will improve his shoulder FOTO by at least 10 points as a demonstration of improved function.    Baseline  shoulder FOTO: 50 (05/12/2019)    Time  8    Period  Weeks    Status  New    Target Date  07/10/19      PT LONG TERM GOAL #5   Title  Pt will improve R shoulder ER and IR strength by at least 1/2 MMT grade to promote ability to raise his R arm with less pain.    Baseline  R shoulder ER and IR 4/5 (05/12/2019)    Time  8    Period  Weeks    Status  New    Target Date  07/10/19      Additional Long Term Goals   Additional Long Term Goals  Yes      PT LONG TERM GOAL #6   Title  Patient will improve R hip extension and abduction strength by at least 1/2 MMT grade to promote ability to perform closed chain tasks with less knee pain.    Time  8    Period  Weeks    Status  New    Target Date  07/10/19            Plan - 05/28/19 0847    Clinical Impression  Statement  Continued working on improving R shoulder joint mobility, decreasing soft tissue restrictions, and promoting glenohumeral mechanics. Difficulty with proper scapular positioning when raising his arm in both flexion, and abduction due to both weakness and poor motor control. PT manual assist needed to promote posterior tipping and scapular retraction to decrease impingement. Pt tolerated session well without aggravation of symptoms. Pt will benefit from continued skilled physical therapy services to decrease stiffness and pain, improve ROM, strength, motor control, and function.    Personal Factors and Comorbidities  Age;Past/Current Experience;Time since onset of injury/illness/exacerbation;Comorbidity 1    Comorbidities  Hx of HTN    Examination-Activity Limitations  Bend;Sit;Transfers;Lift    Rehab Potential  Fair    Clinical Impairments Affecting Rehab Potential  (+) motivated, likes to be active; (-) Chronicity of condition, age, multiple areas of pain    PT Frequency  2x / week    PT Duration  8 weeks    PT Treatment/Interventions  Therapeutic activities;Therapeutic exercise;Neuromuscular re-education;Patient/family education;Manual techniques;Dry needling;Spinal Manipulations;Joint Manipulations;Aquatic Therapy;Electrical Stimulation;Iontophoresis 4mg /ml Dexamethasone;Traction    PT Next Visit Plan  R shoulder joint mobility/distraction, ER, IR, scapular strengthening, posture, hip mobility, trunk, glute strengthening and lumbopelvic and femoral control, manual techniques, modalities PRN  PT Home Exercise Plan  Medbridge Access Code [email or web address]; Pt would like a low back program for home to address strengthening and pain as needed (stretching, core exercises).    Consulted and Agree with Plan of Care  Patient       Patient will benefit from skilled therapeutic intervention in order to improve the following deficits and impairments:  Pain, Postural dysfunction, Impaired UE functional use,  Improper body mechanics, Impaired flexibility, Hypomobility, Decreased strength, Decreased range of motion  Visit Diagnosis: Chronic right shoulder pain  Muscle weakness (generalized)     Problem List Patient Active Problem List   Diagnosis Date Noted  . Disorder of bursae of shoulder region 12/11/2018  . Contusion of shoulder region 12/11/2018  . Right inguinal hernia 03/20/2014  . Benign essential hypertension 05/15/2013  . Ischemic cardiomyopathy 03/14/2013  . Pure hypercholesterolemia 03/14/2013  . Coronary artery disease involving native coronary artery of native heart without angina pectoris 03/07/2013  . Diabetes mellitus (HCC) 03/07/2013  . GERD (gastroesophageal reflux disease) 03/07/2013  . STEMI (ST elevation myocardial infarction) (HCC) 03/07/2013    Loralyn Freshwater PT, DPT   05/28/2019, 10:11 AM  [place] [facility] PHYSICAL AND SPORTS MEDICINE [address] [place], [address] Phone: [phone]   Fax:  [phone]  Name: Gabriel Butler MRN: [id number] Date of Birth: [birth year]

## 2019-06-02 ENCOUNTER — Ambulatory Visit: Payer: 59

## 2019-06-02 ENCOUNTER — Other Ambulatory Visit: Payer: Self-pay

## 2019-06-02 DIAGNOSIS — M25552 Pain in left hip: Secondary | ICD-10-CM | POA: Diagnosis not present

## 2019-06-02 DIAGNOSIS — M545 Low back pain: Secondary | ICD-10-CM | POA: Diagnosis not present

## 2019-06-02 DIAGNOSIS — G8929 Other chronic pain: Secondary | ICD-10-CM | POA: Diagnosis not present

## 2019-06-02 DIAGNOSIS — M79605 Pain in left leg: Secondary | ICD-10-CM | POA: Diagnosis not present

## 2019-06-02 DIAGNOSIS — M6281 Muscle weakness (generalized): Secondary | ICD-10-CM | POA: Diagnosis not present

## 2019-06-02 DIAGNOSIS — M25511 Pain in right shoulder: Secondary | ICD-10-CM | POA: Diagnosis not present

## 2019-06-02 DIAGNOSIS — M25562 Pain in left knee: Secondary | ICD-10-CM | POA: Diagnosis not present

## 2019-06-02 DIAGNOSIS — M25561 Pain in right knee: Secondary | ICD-10-CM | POA: Diagnosis not present

## 2019-06-02 NOTE — Patient Instructions (Addendum)
Prone scapular retraction 10x5 seconds for 3 sets  Reviewed and given as part of HEP. Pt demonstrated and verbalized understanding. Handout provided.

## 2019-06-02 NOTE — Therapy (Signed)
[facility] PHYS[facility]ORTS MEDICINE [address] [place], [address] Phone: [phone]   Fax:  [phone]  Physical Therapy Treatment  Patient Details  Name: Gabriel Butler MRN: [id number] Date of Birth: Jun 27, 1950 Referring Provider (PT): Cassell Smiles, MD   Encounter Date: 06/02/2019  PT End of Session - 06/02/19 0846    Visit Number  7    Number of Visits  17    Date for PT Re-Evaluation  07/10/19    Authorization Type  7    Authorization Time Period  of 10 progress report    PT Start Time  0846    PT Stop Time  0932    PT Time Calculation (min)  46 min    Activity Tolerance  Patient tolerated treatment well    Behavior During Therapy  [facility] for tasks assessed/performed       Past Medical History:  Diagnosis Date  . GERD (gastroesophageal reflux disease)   . Heart attack (HCC) 03/05/2013   Coronary stent placement.  . Hypertension     Past Surgical History:  Procedure Laterality Date  . COLONOSCOPY  2013  . CORONARY ANGIOPLASTY WITH STENT PLACEMENT    . HERNIA REPAIR Right 03/26/14   Direct and indirect hernia noted, large ultra Pro mesh utilized.    There were no vitals filed for this visit.  Subjective Assessment - 06/02/19 0848    Subjective  R shoulder is not bad. 4-5/10 when raising his R arm. Can reach back better. Has not been able to do his exerises at home. Busy.    Pertinent History  R knee and R shoulder pain. Pt had an injection in R knee (cortisone) about a month ago which has helped substantially. R knee pain started about 6 months ago. R shoulder has been bothering him for tha past year. Raising his arm up into flexion causes R anterior shoulder. Pt also states having low back pain. Aggravating factors include putting much in his yard. Has a hx of arthritis. 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 His general  practitioner suggested that he might have a frozen shoulder.  Pt is R hand dominand.  Pt states that he has not been keeping up with his stretches.  Back pain has been off and on for several years. Had chiropractic treatment which helped.  Has not had surgeries for his R shoulder, R knee and low back. Denies LE paresthesia, or loss of bowel or bladder control.    Limitations  Sitting;Lifting;House hold activities;Standing;Walking    How long can you sit comfortably?     How long can you stand comfortably?     How long can you walk comfortably?     Diagnostic tests  None    Patient Stated Goals  Wants to be able to play golf, make his pain more comfortable. Be able to keep up with grandchildren.    Currently in Pain?  Yes    Pain Score  4    4-5/10                              PT Education - 06/02/19 1534    Education provided  Yes    Education Details  ther-ex, HEP    Person(s) Educated  Patient    Methods  Explanation;Demonstration;Tactile cues;Verbal cues;Handout  Comprehension  Returned demonstration;Verbalized understanding      Objective   no blood pressure problems and no latex band allergies per pt  Pt adds also feeling muscle cramps B lateral thigh as well as medial thigh  MedbridgeAccess Code [email or web address]   Manual therapy   L S/L R scapular mobilization  Supine with R shoulder in flexion  STM R teres major muscle   Supine STM R pectoralis muscle to decrease muscle and fascial tension  Supine STM R subscapularis muscle area    Supine R shoulder ER PROM in scaption 10x3  Supine R shoulder adduction mobilization and stretch   Seated STM R rhomboid and upper trap muscle     Therapeutic exercise Prone scapular retraction 10x5 seconds for 3 sets  Reviewed and given as part of HEP. Pt demonstrated and verbalized understanding. Handout provided.   Prone scapular retraction with scaption 10x    Prone scapular retraction with flexion 10x      Improved exercise technique, movement at target joints, use of target muscles after mod verbal, visual, tactile cues.    Response to treatment Pt tolerated session well without aggravation of symptoms    Clinical impression Improved supine R shoulder flexion, scaption, and ER observed in PROM/AAROM with manual therapy. Decreased when raising his R arm up into flexion and scaption against gravity in which scapular weakness and decreased scapular control seem to play a factor, with R shoulder shrug compensation.  Pt will benefit from continued skilled physical therapy services to decrease pain, improve AROM, strength, and function.     PT Short Term Goals - 05/12/19 1015      PT SHORT TERM GOAL #1   Title  Patient will be independent with his HEP to decrease R shoulder, R knee and low back pain as well as to decrease difficulty reaching, standing up from prolonged sitting, and performing yard work.    Baseline  Pt has started his HEP (05/12/2019)    Time  3    Period  Weeks    Status  New    Target Date  06/05/19        PT Long Term Goals - 05/12/19 1016      PT LONG TERM GOAL #1   Title  Patient will have a decrease in R shoulder pain to 3/10 or less at worst to promote ability to reach, perform yard work, play golf more comfortably.    Baseline  7/10 R shoulder pain at most for the past 3 months (05/12/2019)    Time  8    Period  Weeks    Status  New    Target Date  07/10/19      PT LONG TERM GOAL #2   Title  Pt will have a decrease in R knee pain to 2/10 or less at worst to promote ability to stand up after prolonged sitting, as well as perform yardwork more comfortably.    Baseline  5/10 R knee pain at most for the past 3 months (05/12/2019)    Time  8    Period  Weeks    Status  New    Target Date  07/10/19      PT LONG TERM GOAL #3   Title  Patient will have a decrease in low back pain to 1/10 or less at most  to promote ability to perform standing tasks as well as yard work more comfortably.    Baseline  4/10 back pain at most for the  past 3 months (05/12/2019)    Time  8    Period  Weeks    Status  New    Target Date  07/10/19      PT LONG TERM GOAL #4   Title  Pt will improve his shoulder FOTO by at least 10 points as a demonstration of improved function.    Baseline  shoulder FOTO: 50 (05/12/2019)    Time  8    Period  Weeks    Status  New    Target Date  07/10/19      PT LONG TERM GOAL #5   Title  Pt will improve R shoulder ER and IR strength by at least 1/2 MMT grade to promote ability to raise his R arm with less pain.    Baseline  R shoulder ER and IR 4/5 (05/12/2019)    Time  8    Period  Weeks    Status  New    Target Date  07/10/19      Additional Long Term Goals   Additional Long Term Goals  Yes      PT LONG TERM GOAL #6   Title  Patient will improve R hip extension and abduction strength by at least 1/2 MMT grade to promote ability to perform closed chain tasks with less knee pain.    Time  8    Period  Weeks    Status  New    Target Date  07/10/19            Plan - 06/02/19 1534    Clinical Impression Statement  Improved supine R shoulder flexion, scaption, and ER observed in PROM/AAROM with manual therapy. Decreased when raising his R arm up into flexion and scaption against gravity in which scapular weakness and decreased scapular control seem to play a factor, with R shoulder shrug compensation.  Pt will benefit from continued skilled physical therapy services to decrease pain, improve AROM, strength, and function.    Personal Factors and Comorbidities  Age;Past/Current Experience;Time since onset of injury/illness/exacerbation;Comorbidity 1    Comorbidities  Hx of HTN    Examination-Activity Limitations  Bend;Sit;Transfers;Lift    Rehab Potential  Fair    Clinical Impairments Affecting Rehab Potential  (+) motivated, likes to be active; (-) Chronicity of condition,  age, multiple areas of pain    PT Frequency  2x / week    PT Duration  8 weeks    PT Treatment/Interventions  Therapeutic activities;Therapeutic exercise;Neuromuscular re-education;Patient/family education;Manual techniques;Dry needling;Spinal Manipulations;Joint Manipulations;Aquatic Therapy;Electrical Stimulation;Iontophoresis 4mg /ml Dexamethasone;Traction    PT Next Visit Plan  R shoulder joint mobility/distraction, ER, IR, scapular strengthening, posture, hip mobility, trunk, glute strengthening and lumbopelvic and femoral control, manual techniques, modalities PRN    PT Home Exercise Plan  Medbridge Access Code [email or web address]; Pt would like a low back program for home to address strengthening and pain as needed (stretching, core exercises).    Consulted and Agree with Plan of Care  Patient       Patient will benefit from skilled therapeutic intervention in order to improve the following deficits and impairments:  Pain, Postural dysfunction, Impaired UE functional use, Improper body mechanics, Impaired flexibility, Hypomobility, Decreased strength, Decreased range of motion  Visit Diagnosis: Chronic right shoulder pain  Muscle weakness (generalized)     Problem List Patient Active Problem List   Diagnosis Date Noted  . Disorder of bursae of shoulder region 12/11/2018  . Contusion of shoulder region 12/11/2018  . Right inguinal hernia  03/20/2014  . Benign essential hypertension 05/15/2013  . Ischemic cardiomyopathy 03/14/2013  . Pure hypercholesterolemia 03/14/2013  . Coronary artery disease involving native coronary artery of native heart without angina pectoris 03/07/2013  . Diabetes mellitus (HCC) 03/07/2013  . GERD (gastroesophageal reflux disease) 03/07/2013  . STEMI (ST elevation myocardial infarction) (HCC) 03/07/2013    Loralyn Freshwater PT, DPT   06/02/2019, 3:47 PM  [place] [facility] PHYSICAL AND SPORTS MEDICINE [address], [place],  [address] Phone: [phone]   Fax:  [phone]  Name: Elena Cothern MRN: [id number] Date of Birth: 05-12-1950

## 2019-06-03 ENCOUNTER — Other Ambulatory Visit: Payer: Self-pay | Admitting: Physician Assistant

## 2019-06-03 ENCOUNTER — Other Ambulatory Visit: Payer: Self-pay | Admitting: Internal Medicine

## 2019-06-03 DIAGNOSIS — I10 Essential (primary) hypertension: Secondary | ICD-10-CM

## 2019-06-03 DIAGNOSIS — G4700 Insomnia, unspecified: Secondary | ICD-10-CM

## 2019-06-04 ENCOUNTER — Other Ambulatory Visit: Payer: Self-pay

## 2019-06-04 ENCOUNTER — Ambulatory Visit: Payer: 59

## 2019-06-04 DIAGNOSIS — M25562 Pain in left knee: Secondary | ICD-10-CM | POA: Diagnosis not present

## 2019-06-04 DIAGNOSIS — M545 Low back pain: Secondary | ICD-10-CM | POA: Diagnosis not present

## 2019-06-04 DIAGNOSIS — M79605 Pain in left leg: Secondary | ICD-10-CM | POA: Diagnosis not present

## 2019-06-04 DIAGNOSIS — M25552 Pain in left hip: Secondary | ICD-10-CM

## 2019-06-04 DIAGNOSIS — M6281 Muscle weakness (generalized): Secondary | ICD-10-CM | POA: Diagnosis not present

## 2019-06-04 DIAGNOSIS — M25561 Pain in right knee: Secondary | ICD-10-CM | POA: Diagnosis not present

## 2019-06-04 DIAGNOSIS — G8929 Other chronic pain: Secondary | ICD-10-CM | POA: Diagnosis not present

## 2019-06-04 DIAGNOSIS — M25511 Pain in right shoulder: Secondary | ICD-10-CM | POA: Diagnosis not present

## 2019-06-04 NOTE — Therapy (Signed)
[facility] [address] [place], [address] Phone: [phone]   Fax:  [phone]  Physical Therapy Treatment  Patient Details  Name: Gabriel Butler MRN: [id number] Date of Birth: 1950/12/17 Referring Provider (PT): Kurtis Bushman, MD   Encounter Date: 06/04/2019  PT End of Session - 06/04/19 0846    Visit Number  8    Number of Visits  17    Date for PT Re-Evaluation  07/10/19    Authorization Type  8    Authorization Time Period  of 10 progress report    PT Start Time  0846    PT Stop Time  0930    PT Time Calculation (min)  44 min    Activity Tolerance  Patient tolerated treatment well    Behavior During Therapy  [facility] for tasks assessed/performed       Past Medical History:  Diagnosis Date  . GERD (gastroesophageal reflux disease)   . Heart attack ([place]) 03/05/2013   Coronary stent placement.  . Hypertension     Past Surgical History:  Procedure Laterality Date  . COLONOSCOPY  2013  . CORONARY ANGIOPLASTY WITH STENT PLACEMENT    . HERNIA REPAIR Right 03/26/14   Direct and indirect hernia noted, large ultra Pro mesh utilized.    There were no vitals filed for this visit.  Subjective Assessment - 06/04/19 0846    Subjective  Patient reported that he thinks his shoulder is doing better, that he does some stupid things (alludes to yard work), that he feels the pain the next morning. Stated it is better since starting PT.    Pertinent History  R knee and R shoulder pain. Pt had an injection in R knee (cortisone) about a month ago which has helped substantially. R knee pain started about 6 months ago. R shoulder has been bothering him for tha past year. Raising his arm up into flexion causes R anterior shoulder. Pt also states having low back pain. Aggravating factors include putting much in his yard. Has a hx of arthritis. 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 His general practitioner suggested that he might have a frozen shoulder.  Pt is R hand dominand.  Pt states that he has not been keeping up with his stretches.  Back pain has been off and on for several years. Had chiropractic treatment which helped.  Has not had surgeries for his R shoulder, R knee and low back. Denies LE paresthesia, or loss of bowel or bladder control.    Limitations  Sitting;Lifting;House hold activities;Standing;Walking    How long can you sit comfortably?  46min    How long can you stand comfortably?  39min    How long can you walk comfortably?  79mins    Patient Stated Goals  Wants to be able to play golf, make his pain more comfortable. Be able to keep up with grandchildren.    Currently in Pain?  Yes    Pain Score  4    4-5/10 with movement of RUE   Pain Location  Shoulder    Pain Orientation  Right    Pain Descriptors / Indicators  Sharp    Pain Type  Chronic pain    Pain Radiating Towards  Pt reported the pain radiates down to his R hand    Pain Onset  More than a month ago  Objective    no blood pressure problems and no latex band allergies per pt   Pt adds also feeling muscle cramps B lateral thigh as well as medial thigh   Medbridge Access Code [email or web address]     Manual therapy    L S/L R scapular mobilization   STM R teres major muscle, supraspinatus, infraspinatus   Supine STM R pectoralis muscle to decrease muscle and fascial tension   AP glide II-III of R shoulder 4 bouts x30seconds        Therapeutic exercise  Prone scapular retraction 10x5 seconds for 3 sets             Reviewed and given as part of HEP. Pt demonstrated and verbalized understanding. Handout provided.   -given as a handout  Supine R shoulder ER PROM in scaption 10x3, AAROM x10 with dowel, pt reported grinding and concordant pain, level of scaption adjusted, angle of ER adjusted, pt reported improvement   Improved  exercise technique, movement at target joints, use of target muscles after maximum verbal, visual, tactile cues.      Pt response/clinical impression: The patient reported some decrease in pain after session with shoulder flexion. During ER movements pt reported concordant pain and grinding/popping sensation, exercise adjusted and pt reported resolvment of pain. Overall the patient would benefit from continued skilled PT intervention to continue to progress towards goals to maximize mobility, ROM, and strength.     PT Education - 06/04/19 0845    Education provided  Yes    Education Details  therex, HEP    Person(s) Educated  Patient    Methods  Explanation;Demonstration;Tactile cues;Handout    Comprehension  Verbalized understanding;Returned demonstration;Need further instruction;Tactile cues required       PT Short Term Goals - 05/12/19 1015      PT SHORT TERM GOAL #1   Title  Patient will be independent with his HEP to decrease R shoulder, R knee and low back pain as well as to decrease difficulty reaching, standing up from prolonged sitting, and performing yard work.    Baseline  Pt has started his HEP (05/12/2019)    Time  3    Period  Weeks    Status  New    Target Date  06/05/19        PT Long Term Goals - 05/12/19 1016      PT LONG TERM GOAL #1   Title  Patient will have a decrease in R shoulder pain to 3/10 or less at worst to promote ability to reach, perform yard work, play golf more comfortably.    Baseline  7/10 R shoulder pain at most for the past 3 months (05/12/2019)    Time  8    Period  Weeks    Status  New    Target Date  07/10/19      PT LONG TERM GOAL #2   Title  Pt will have a decrease in R knee pain to 2/10 or less at worst to promote ability to stand up after prolonged sitting, as well as perform yardwork more comfortably.    Baseline  5/10 R knee pain at most for the past 3 months (05/12/2019)    Time  8    Period  Weeks    Status  New    Target Date   07/10/19      PT LONG TERM GOAL #3   Title  Patient will have a decrease in low back pain to 1/10  or less at most to promote ability to perform standing tasks as well as yard work more comfortably.    Baseline  4/10 back pain at most for the past 3 months (05/12/2019)    Time  8    Period  Weeks    Status  New    Target Date  07/10/19      PT LONG TERM GOAL #4   Title  Pt will improve his shoulder FOTO by at least 10 points as a demonstration of improved function.    Baseline  shoulder FOTO: 50 (05/12/2019)    Time  8    Period  Weeks    Status  New    Target Date  07/10/19      PT LONG TERM GOAL #5   Title  Pt will improve R shoulder ER and IR strength by at least 1/2 MMT grade to promote ability to raise his R arm with less pain.    Baseline  R shoulder ER and IR 4/5 (05/12/2019)    Time  8    Period  Weeks    Status  New    Target Date  07/10/19      Additional Long Term Goals   Additional Long Term Goals  Yes      PT LONG TERM GOAL #6   Title  Patient will improve R hip extension and abduction strength by at least 1/2 MMT grade to promote ability to perform closed chain tasks with less knee pain.    Time  8    Period  Weeks    Status  New    Target Date  07/10/19            Plan - 06/04/19 0846    Clinical Impression Statement  The patient reported some decrease in pain after session with shoulder flexion. During ER movements pt reported concordant pain and grinding/popping sensation, exercise adjusted and pt reported resolvment of pain. Overall the patient would benefit from continued skilled PT intervention to continue to progress towards goals to maximize mobility, ROM, and strength.    Personal Factors and Comorbidities  Age;Past/Current Experience;Time since onset of injury/illness/exacerbation;Comorbidity 1    Comorbidities  Hx of HTN    Examination-Activity Limitations  Bend;Sit;Transfers;Lift    Stability/Clinical Decision Making  Evolving/Moderate complexity     Rehab Potential  Fair    Clinical Impairments Affecting Rehab Potential  (+) motivated, likes to be active; (-) Chronicity of condition, age, multiple areas of pain    PT Frequency  2x / week    PT Duration  8 weeks    PT Treatment/Interventions  Therapeutic activities;Therapeutic exercise;Neuromuscular re-education;Patient/family education;Manual techniques;Dry needling;Spinal Manipulations;Joint Manipulations;Aquatic Therapy;Electrical Stimulation;Iontophoresis 4mg /ml Dexamethasone;Traction    PT Next Visit Plan  R shoulder joint mobility/distraction, ER, IR, scapular strengthening, posture, hip mobility, trunk, glute strengthening and lumbopelvic and femoral control, manual techniques, modalities PRN    PT Home Exercise Plan  Medbridge Access Code [email or web address]; Pt would like a low back program for home to address strengthening and pain as needed (stretching, core exercises).    Consulted and Agree with Plan of Care  Patient       Patient will benefit from skilled therapeutic intervention in order to improve the following deficits and impairments:  Pain, Postural dysfunction, Impaired UE functional use, Improper body mechanics, Impaired flexibility, Hypomobility, Decreased strength, Decreased range of motion  Visit Diagnosis: Chronic right shoulder pain  Acute pain of left knee  Muscle weakness (generalized)  Pain  in left leg  Chronic pain of right knee  Chronic bilateral low back pain, unspecified whether sciatica present  Pain in left hip     Problem List Patient Active Problem List   Diagnosis Date Noted  . Disorder of bursae of shoulder region 12/11/2018  . Contusion of shoulder region 12/11/2018  . Right inguinal hernia 03/20/2014  . Benign essential hypertension 05/15/2013  . Ischemic cardiomyopathy 03/14/2013  . Pure hypercholesterolemia 03/14/2013  . Coronary artery disease involving native coronary artery of native heart without angina pectoris 03/07/2013  . Diabetes  mellitus ([facility]) 03/07/2013  . GERD (gastroesophageal reflux disease) 03/07/2013  . STEMI (ST elevation myocardial infarction) ([facility]) 03/07/2013    Olga Coaster PT, DPT 9:38 AM,06/04/19   [facility] [address] [place], [address] Phone: [phone]   Fax:  [phone]  Name: Gabriel Butler MRN: [id number] Date of Birth: 01-13-1951

## 2019-06-10 ENCOUNTER — Ambulatory Visit: Payer: 59 | Attending: Orthopedic Surgery

## 2019-06-10 ENCOUNTER — Other Ambulatory Visit: Payer: Self-pay

## 2019-06-10 DIAGNOSIS — G8929 Other chronic pain: Secondary | ICD-10-CM | POA: Diagnosis not present

## 2019-06-10 DIAGNOSIS — M79605 Pain in left leg: Secondary | ICD-10-CM | POA: Insufficient documentation

## 2019-06-10 DIAGNOSIS — M25552 Pain in left hip: Secondary | ICD-10-CM | POA: Diagnosis not present

## 2019-06-10 DIAGNOSIS — M545 Low back pain: Secondary | ICD-10-CM | POA: Insufficient documentation

## 2019-06-10 DIAGNOSIS — M25511 Pain in right shoulder: Secondary | ICD-10-CM | POA: Insufficient documentation

## 2019-06-10 DIAGNOSIS — M25562 Pain in left knee: Secondary | ICD-10-CM | POA: Diagnosis not present

## 2019-06-10 DIAGNOSIS — M6281 Muscle weakness (generalized): Secondary | ICD-10-CM | POA: Diagnosis not present

## 2019-06-10 DIAGNOSIS — M25561 Pain in right knee: Secondary | ICD-10-CM | POA: Diagnosis not present

## 2019-06-10 NOTE — Therapy (Signed)
[facility] [address] [place], [address] Phone: [phone]   Fax:  [phone]  Physical Therapy Treatment  Patient Details  Name: Gabriel Butler MRN: [id number] Date of Birth: [birth year] Referring Provider (PT): Cassell Smiles, MD   Encounter Date: 06/10/2019  PT End of Session - 06/10/19 1301    Visit Number  9    Number of Visits  17    Date for PT Re-Evaluation  07/10/19    Authorization Type  9    Authorization Time Period  of 10 progress report    PT Start Time  1301    PT Stop Time  1330    PT Time Calculation (min)  29 min    Activity Tolerance  Patient tolerated treatment well    Behavior During Therapy  [facility] for tasks assessed/performed       Past Medical History:  Diagnosis Date  . GERD (gastroesophageal reflux disease)   . Heart attack ([facility]) 03/05/2013   Coronary stent placement.  . Hypertension     Past Surgical History:  Procedure Laterality Date  . COLONOSCOPY  2013  . CORONARY ANGIOPLASTY WITH STENT PLACEMENT    . HERNIA REPAIR Right 03/26/14   Direct and indirect hernia noted, large ultra Pro mesh utilized.    There were no vitals filed for this visit.  Subjective Assessment - 06/10/19 1302    Subjective  Has to leave by 1:30 pm by work. No pain at rest. 3-4/10 R shoulder pain with flexion. 5/10 at most lifting stuff at yard work. No back pain currently, 3/10 at worst for the past 7 days. No R knee pain currently, 2/10 R knee pain at most for the past 7 days.    Pertinent History  R knee and R shoulder pain. Pt had an injection in R knee (cortisone) about a month ago which has helped substantially. R knee pain started about 6 months ago. R shoulder has been bothering him for tha past year. Raising his arm up into flexion causes R anterior shoulder. Pt also states having low back pain. Aggravating factors include putting much in his yard. Has a hx of arthritis. 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 His general practitioner suggested that he might have a frozen shoulder.  Pt is R hand dominand.  Pt states that he has not been keeping up with his stretches.  Back pain has been off and on for several years. Had chiropractic treatment which helped.  Has not had surgeries for his R shoulder, R knee and low back. Denies LE paresthesia, or loss of bowel or bladder control.    Limitations  Sitting;Lifting;House hold activities;Standing;Walking    How long can you sit comfortably?     How long can you stand comfortably?     How long can you walk comfortably?     Patient Stated Goals  Wants to be able to play golf, make his pain more comfortable. Be able to keep up with grandchildren.    Currently in Pain?  Yes    Pain Score  4     Pain Onset  More than a month ago                               PT Education - 06/10/19 2023  Education provided  Yes    Education Details  ther-ex    Person(s) Educated  Patient    Methods  Explanation;Demonstration;Tactile cues;Verbal cues    Comprehension  Returned demonstration;Verbalized understanding      Objective   no blood pressure problems and no latex band allergies per pt  Pt adds also feeling muscle cramps B lateral thigh as well as medial thigh  MedbridgeAccess Code [email or web address]   Manual therapy  Supine with R shoulder in flexion  STM R teres major muscle    Inferior lateral mob grade 3    Therapeutic exercise  R shoulder AROM at start of session  Flexion 111 degrees  scaption 122 degrees  Supine manually resisted R shoulder extension 10x2 Seated R shoulder ER isometrics, PT manually resisted 10x5 seconds, then 5x5 seconds.  Crepitus Seated R shoulder IR isometrics, PT manually resisted 10x2 with 5 second holds  Seated manually resisted triceps extension  isometric 10x5 seconds     Seated manually resisted scapular retraction targeting the lower trap muscles 10x2 with 5 second holds      Improved exercise technique, movement at target joints, use of target muscles after mod verbal, visual, tactile cues.    Response to treatment Pt tolerated session well without aggravation of symptoms    Clinical impression Pt demonstrates improving ability to perform scapular retraction, though still needs cues. Continued working on improving shoulder joint mobility and decreasing soft tissue restrictions as well as improving muscle strength around shoulder to promote better glenohumeral control with flexion and scaption against gravity. Crepitus palpated in R shoulder with isometric ER, IR, and triceps extension. Pt tolerated session well without aggravation of symptoms. Pt will benefit from continued skilled physical therapy services to improve ROM, strength, function, and decrease pain.     PT Short Term Goals - 05/12/19 1015      PT SHORT TERM GOAL #1   Title  Patient will be independent with his HEP to decrease R shoulder, R knee and low back pain as well as to decrease difficulty reaching, standing up from prolonged sitting, and performing yard work.    Baseline  Pt has started his HEP (05/12/2019)    Time  3    Period  Weeks    Status  New    Target Date  06/05/19        PT Long Term Goals - 05/12/19 1016      PT LONG TERM GOAL #1   Title  Patient will have a decrease in R shoulder pain to 3/10 or less at worst to promote ability to reach, perform yard work, play golf more comfortably.    Baseline  7/10 R shoulder pain at most for the past 3 months (05/12/2019)    Time  8    Period  Weeks    Status  New    Target Date  07/10/19      PT LONG TERM GOAL #2   Title  Pt will have a decrease in R knee pain to 2/10 or less at worst to promote ability to stand up after prolonged sitting, as well as perform yardwork more comfortably.     Baseline  5/10 R knee pain at most for the past 3 months (05/12/2019)    Time  8    Period  Weeks    Status  New    Target Date  07/10/19      PT LONG TERM GOAL #3   Title  Patient will have  a decrease in low back pain to 1/10 or less at most to promote ability to perform standing tasks as well as yard work more comfortably.    Baseline  4/10 back pain at most for the past 3 months (05/12/2019)    Time  8    Period  Weeks    Status  New    Target Date  07/10/19      PT LONG TERM GOAL #4   Title  Pt will improve his shoulder FOTO by at least 10 points as a demonstration of improved function.    Baseline  shoulder FOTO: 50 (05/12/2019)    Time  8    Period  Weeks    Status  New    Target Date  07/10/19      PT LONG TERM GOAL #5   Title  Pt will improve R shoulder ER and IR strength by at least 1/2 MMT grade to promote ability to raise his R arm with less pain.    Baseline  R shoulder ER and IR 4/5 (05/12/2019)    Time  8    Period  Weeks    Status  New    Target Date  07/10/19      Additional Long Term Goals   Additional Long Term Goals  Yes      PT LONG TERM GOAL #6   Title  Patient will improve R hip extension and abduction strength by at least 1/2 MMT grade to promote ability to perform closed chain tasks with less knee pain.    Time  8    Period  Weeks    Status  New    Target Date  07/10/19            Plan - 06/10/19 2024    Clinical Impression Statement  Pt demonstrates improving ability to perform scapular retraction, though still needs cues. Continued working on improving shoulder joint mobility and decreasing soft tissue restrictions as well as improving muscle strength around shoulder to promote better glenohumeral control with flexion and scaption against gravity. Crepitus palpated in R shoulder with isometric ER, IR, and triceps extension. Pt tolerated session well without aggravation of symptoms. Pt will benefit from continued skilled physical therapy services to  improve ROM, strength, function, and decrease pain.    Personal Factors and Comorbidities  Age;Past/Current Experience;Time since onset of injury/illness/exacerbation;Comorbidity 1    Comorbidities  Hx of HTN    Examination-Activity Limitations  Bend;Sit;Transfers;Lift    Stability/Clinical Decision Making  Evolving/Moderate complexity    Rehab Potential  Fair    Clinical Impairments Affecting Rehab Potential  (+) motivated, likes to be active; (-) Chronicity of condition, age, multiple areas of pain    PT Frequency  2x / week    PT Duration  8 weeks    PT Treatment/Interventions  Therapeutic activities;Therapeutic exercise;Neuromuscular re-education;Patient/family education;Manual techniques;Dry needling;Spinal Manipulations;Joint Manipulations;Aquatic Therapy;Electrical Stimulation;Iontophoresis 4mg /ml Dexamethasone;Traction    PT Next Visit Plan  R shoulder joint mobility/distraction, ER, IR, scapular strengthening, posture, hip mobility, trunk, glute strengthening and lumbopelvic and femoral control, manual techniques, modalities PRN    PT Home Exercise Plan  Medbridge Access Code [email or web address]; Pt would like a low back program for home to address strengthening and pain as needed (stretching, core exercises).    Consulted and Agree with Plan of Care  Patient       Patient will benefit from skilled therapeutic intervention in order to improve the following deficits and impairments:  Pain, Postural  dysfunction, Impaired UE functional use, Improper body mechanics, Impaired flexibility, Hypomobility, Decreased strength, Decreased range of motion  Visit Diagnosis: Chronic right shoulder pain  Muscle weakness (generalized)     Problem List Patient Active Problem List   Diagnosis Date Noted  . Disorder of bursae of shoulder region 12/11/2018  . Contusion of shoulder region 12/11/2018  . Right inguinal hernia 03/20/2014  . Benign essential hypertension 05/15/2013  . Ischemic cardiomyopathy  03/14/2013  . Pure hypercholesterolemia 03/14/2013  . Coronary artery disease involving native coronary artery of native heart without angina pectoris 03/07/2013  . Diabetes mellitus ([place]) 03/07/2013  . GERD (gastroesophageal reflux disease) 03/07/2013  . STEMI (ST elevation myocardial infarction) ([place]) 03/07/2013    Joneen Boers PT, DPT   06/10/2019, 8:27 PM  [facility] [facility] [address], [place], [address] Phone: [phone]   Fax:  [phone]  Name: Gabriel Butler MRN: [id number] Date of Birth: 05/30/1950

## 2019-06-12 ENCOUNTER — Ambulatory Visit: Payer: 59

## 2019-06-12 ENCOUNTER — Other Ambulatory Visit: Payer: Self-pay

## 2019-06-12 DIAGNOSIS — M6281 Muscle weakness (generalized): Secondary | ICD-10-CM

## 2019-06-12 DIAGNOSIS — G8929 Other chronic pain: Secondary | ICD-10-CM | POA: Diagnosis not present

## 2019-06-12 DIAGNOSIS — M79605 Pain in left leg: Secondary | ICD-10-CM

## 2019-06-12 DIAGNOSIS — M25562 Pain in left knee: Secondary | ICD-10-CM

## 2019-06-12 DIAGNOSIS — M25552 Pain in left hip: Secondary | ICD-10-CM

## 2019-06-12 DIAGNOSIS — M25561 Pain in right knee: Secondary | ICD-10-CM | POA: Diagnosis not present

## 2019-06-12 DIAGNOSIS — M25511 Pain in right shoulder: Secondary | ICD-10-CM | POA: Diagnosis not present

## 2019-06-12 DIAGNOSIS — M545 Low back pain: Secondary | ICD-10-CM | POA: Diagnosis not present

## 2019-06-12 NOTE — Therapy (Signed)
[place] [facility] [address] [place], [address] Phone: [phone]   Fax:  [phone]  Physical Therapy Treatment Physical Therapy Progress Note   Dates of reporting period  05/12/2019   to   06/12/2019   Patient Details  Name: Gabriel Butler MRN: [id number] Date of Birth: [birth year] Referring Provider (PT): Kurtis Bushman, MD   Encounter Date: 06/12/2019  PT End of Session - 06/12/19 0929    Visit Number  10    Number of Visits  17    Date for PT Re-Evaluation  07/10/19    Authorization Type  10    Authorization Time Period  of 10 progress report    PT Start Time  0930    PT Stop Time  1015    PT Time Calculation (min)  45 min    Activity Tolerance  Patient tolerated treatment well    Behavior During Therapy  [facility] for tasks assessed/performed       Past Medical History:  Diagnosis Date  . GERD (gastroesophageal reflux disease)   . Heart attack ([place]) 03/05/2013   Coronary stent placement.  . Hypertension     Past Surgical History:  Procedure Laterality Date  . COLONOSCOPY  2013  . CORONARY ANGIOPLASTY WITH STENT PLACEMENT    . HERNIA REPAIR Right 03/26/14   Direct and indirect hernia noted, large ultra Pro mesh utilized.    There were no vitals filed for this visit.  Subjective Assessment - 06/12/19 0930    Subjective  Patient reported that he is doing well today. The patient stated he mowed yesterday and that he did okay. Patient stated he feels that therapy is helping, that it has made strides, that it is better. The pain and the motion of the shoulder is better. He thinks moving forward he needs to continue doing his exercises.    Pertinent History  R knee and R shoulder pain. Pt had an injection in R knee (cortisone) about a month ago which has helped substantially. R knee pain started about 6 months ago. R shoulder has been bothering him for tha past year. Raising his arm up into flexion causes R  anterior shoulder. Pt also states having low back pain. Aggravating factors include putting much in his yard. Has a hx of arthritis. 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 His general practitioner suggested that he might have a frozen shoulder.  Pt is R hand dominand.  Pt states that he has not been keeping up with his stretches.  Back pain has been off and on for several years. Had chiropractic treatment which helped.  Has not had surgeries for his R shoulder, R knee and low back. Denies LE paresthesia, or loss of bowel or bladder control.    Limitations  Sitting;Lifting;House hold activities;Standing;Walking    How long can you sit comfortably?  86min    How long can you stand comfortably?  43min    How long can you walk comfortably?  26mins    Diagnostic tests  None    Patient Stated Goals  Wants to be able to play golf, make his pain more comfortable. Be able to keep up with grandchildren.    Currently in Pain?  --   Pt reported that he is not at pain at rest, but can raise it higher   Pain Location  Shoulder    Pain Orientation  Right    Pain Type  Chronic pain    Pain Onset  More than a month ago       Objective  Shoulder ROM:  L shoulder flexion: 180 L shoulder abduction: 180 L shoulder IR aply:WFLS (t10) L shoulder ER aply: WFLs (T5)  R shoulder flexion: 125 R shoulder abduction: 145 deg R shoulder ER aply: T4 R shoulder IR aply: L5  Shoulder strength:  ER of R: weak, and painful: grossly 3+/5 ER of L: WFLs  IR of R: 4/5 weak and painful IR of L: WFLs   no blood pressure problems and no latex band allergies per pt   Pt adds also feeling muscle cramps B lateral thigh as well as medial thigh   Medbridge Access Code [email or web address]   Therapeutic exercise:    Access Code: [email or web address] URL: [email or web address] Date: 06/04/2019 Prepared by: Olga Coaster  Seated shoulder  depression with tactile/verbal cueing 3x20sec holds Standing Isometric Shoulder External Rotation with Doorway 2x10 with multimodal cueing  Standing Isometric Shoulder Internal Rotation at Doorway 2x10 with multimodal cueing   Seated Thoracic Lumbar Extension verbally reviewed  Prone Scapular Slide with Shoulder Extension verbally reviewed   Standing Shoulder Row with Anchored Resistance x20 with tactile cueing/visual cueing  Scapular Retraction with Resistance Advanced x20 with verbal/tactile cueing  Improved exercise technique, movement at target joints, use of target muscles after mod verbal, visual, tactile cues.      Response to treatment/clinical impression:   The patient reported that his goal is to return to golfing and an exercise program he can maintain at home. Overall the patient has demonstrated improvement towards goals and subjectively reported improvement as well. FOTO score incresaed by 8 points. The patient did exhibit deficits in R shoulder ROM and strength, and would benefit from further skilled PT intervention to return to sport as able and to continue to address these deficits to maximize functional abilities. The patient also requested a postural review, to be addressed next session.       PT Education - 06/12/19 0929    Education provided  Yes    Education Details  therex, HEP    Person(s) Educated  Patient    Methods  Explanation;Demonstration;Tactile cues;Handout;Verbal cues    Comprehension  Verbalized understanding;Returned demonstration       PT Short Term Goals - 06/12/19 4196      PT SHORT TERM GOAL #1   Title  Patient will be independent with his HEP to decrease R shoulder, R knee and low back pain as well as to decrease difficulty reaching, standing up from prolonged sitting, and performing yard work.    Baseline  Pt has started his HEP (05/12/2019); 5/6 pt reported that he needs to be doing them more consistently. Has questions about what he  should be doing moving forward.    Time  4    Period  Weeks    Status  On-going    Target Date  07/10/19        PT Long Term Goals - 06/12/19 0938      PT LONG TERM GOAL #1   Title  Patient will have a decrease in R shoulder pain to 3/10 or less at worst to promote ability to reach, perform yard work, play golf more comfortably.    Baseline  7/10 R shoulder pain at most for the past 3 months (05/12/2019); 06/12/2019 Pt reported worse pain is a 5-6/10 especially with a sudden movement.    Time  4  Period  Weeks    Status  On-going    Target Date  07/10/19      PT LONG TERM GOAL #2   Title  Pt will have a decrease in R knee pain to 2/10 or less at worst to promote ability to stand up after prolonged sitting, as well as perform yardwork more comfortably.    Baseline  5/10 R knee pain at most for the past 3 months (05/12/2019); Patient reported his knee pain is okay especially since the injection.    Time  4    Period  Weeks    Status  Achieved    Target Date  07/10/19      PT LONG TERM GOAL #3   Title  Patient will have a decrease in low back pain to 1/10 or less at most to promote ability to perform standing tasks as well as yard work more comfortably.    Baseline  4/10 back pain at most for the past 3 months (05/12/2019); Pt stated his back pain is not bad, at times his pain will maybe be a 2/10    Time  4    Period  Weeks    Status  Achieved    Target Date  07/10/19      PT LONG TERM GOAL #4   Title  Pt will improve his shoulder FOTO by at least 10 points as a demonstration of improved function.    Baseline  shoulder FOTO: 50 (05/12/2019); shoulder FOTO 58 06/12/2019    Time  4    Period  Weeks    Status  On-going    Target Date  07/10/19      PT LONG TERM GOAL #5   Title  Pt will improve R shoulder ER and IR strength by at least 1/2 MMT grade to promote ability to raise his R arm with less pain.    Baseline  R shoulder ER and IR 4/5 (05/12/2019); Patient reported pain with ER and IR  testing of RUE, ER 3+/5, IR 4/5    Time  4    Period  Weeks    Status  New    Target Date  07/10/19            Plan - 06/12/19 0951    Clinical Impression Statement  The patient reported that his goal is to return to golfing and an exercise program he can maintain at home. Overall the patient has demonstrated improvement towards goals and subjectively reported improvement as well. FOTO score incresaed by 8 points. The patient did exhibit deficits in R shoulder ROM and strength, and would benefit from further skilled PT intervention to return to sport as able and to continue to address these deficits to maximize functional abilities. The patient also requested a postural review, to be addressed next session.    Personal Factors and Comorbidities  Age;Past/Current Experience;Time since onset of injury/illness/exacerbation;Comorbidity 1    Comorbidities  Hx of HTN    Examination-Activity Limitations  Bend;Sit;Transfers;Lift    Stability/Clinical Decision Making  Evolving/Moderate complexity    Rehab Potential  Fair    Clinical Impairments Affecting Rehab Potential  (+) motivated, likes to be active; (-) Chronicity of condition, age, multiple areas of pain    PT Frequency  2x / week    PT Duration  4 weeks    PT Treatment/Interventions  Therapeutic activities;Therapeutic exercise;Neuromuscular re-education;Patient/family education;Manual techniques;Dry needling;Spinal Manipulations;Joint Manipulations;Aquatic Therapy;Electrical Stimulation;Iontophoresis 4mg /ml Dexamethasone;Traction    PT Next Visit Plan  golf swing, updated HEP    PT Home Exercise Plan  Medbridge Access Code [email or web address]; updated    Consulted and Agree with Plan of Care  Patient       Patient will benefit from skilled therapeutic intervention in order to improve the following deficits and impairments:  Pain, Postural dysfunction, Impaired UE functional use, Improper body mechanics, Impaired flexibility, Hypomobility, Decreased  strength, Decreased range of motion  Visit Diagnosis: Chronic right shoulder pain  Muscle weakness (generalized)  Acute pain of left knee  Pain in left leg  Chronic pain of right knee  Chronic bilateral low back pain, unspecified whether sciatica present  Pain in left hip     Problem List Patient Active Problem List   Diagnosis Date Noted  . Disorder of bursae of shoulder region 12/11/2018  . Contusion of shoulder region 12/11/2018  . Right inguinal hernia 03/20/2014  . Benign essential hypertension 05/15/2013  . Ischemic cardiomyopathy 03/14/2013  . Pure hypercholesterolemia 03/14/2013  . Coronary artery disease involving native coronary artery of native heart without angina pectoris 03/07/2013  . Diabetes mellitus ([facility]) 03/07/2013  . GERD (gastroesophageal reflux disease) 03/07/2013  . STEMI (ST elevation myocardial infarction) ([facility]) 03/07/2013    Olga Coaster PT, DPT 10:23 AM,06/12/19   [facility] [address] [place], [address] Phone: [phone]   Fax:  [phone]  Name: Gabriel Butler MRN: [id number] Date of Birth: August 16, 1950

## 2019-06-17 ENCOUNTER — Ambulatory Visit: Payer: 59

## 2019-06-17 ENCOUNTER — Other Ambulatory Visit: Payer: Self-pay

## 2019-06-17 DIAGNOSIS — M545 Low back pain, unspecified: Secondary | ICD-10-CM

## 2019-06-17 DIAGNOSIS — M25511 Pain in right shoulder: Secondary | ICD-10-CM

## 2019-06-17 DIAGNOSIS — M25552 Pain in left hip: Secondary | ICD-10-CM

## 2019-06-17 DIAGNOSIS — G8929 Other chronic pain: Secondary | ICD-10-CM | POA: Diagnosis not present

## 2019-06-17 DIAGNOSIS — M6281 Muscle weakness (generalized): Secondary | ICD-10-CM

## 2019-06-17 DIAGNOSIS — M25562 Pain in left knee: Secondary | ICD-10-CM | POA: Diagnosis not present

## 2019-06-17 DIAGNOSIS — M25561 Pain in right knee: Secondary | ICD-10-CM | POA: Diagnosis not present

## 2019-06-17 DIAGNOSIS — M79605 Pain in left leg: Secondary | ICD-10-CM

## 2019-06-17 NOTE — Therapy (Signed)
[place] [facility] [address] [place], [address] Phone: [phone]   Fax:  [phone]  Physical Therapy Treatment  Patient Details  Name: Gabriel Butler MRN: [id number] Date of Birth: Sep 02, 1950 Referring Provider (PT): Cassell Smiles, MD   Encounter Date: 06/17/2019  PT End of Session - 06/17/19 0947    Visit Number  11    Number of Visits  17    Date for PT Re-Evaluation  07/10/19    Authorization Type  1    Authorization Time Period  of 10 progress report    PT Start Time  0947    PT Stop Time  1030    PT Time Calculation (min)  43 min    Activity Tolerance  Patient tolerated treatment well    Behavior During Therapy  [facility] for tasks assessed/performed       Past Medical History:  Diagnosis Date  . GERD (gastroesophageal reflux disease)   . Heart attack ([facility]) 03/05/2013   Coronary stent placement.  . Hypertension     Past Surgical History:  Procedure Laterality Date  . COLONOSCOPY  2013  . CORONARY ANGIOPLASTY WITH STENT PLACEMENT    . HERNIA REPAIR Right 03/26/14   Direct and indirect hernia noted, large ultra Pro mesh utilized.    There were no vitals filed for this visit.  Subjective Assessment - 06/17/19 0950    Subjective  Patient stated that he has a little discomfort, very small amount of pain in his shoulder.    Pertinent History  R knee and R shoulder pain. Pt had an injection in R knee (cortisone) about a month ago which has helped substantially. R knee pain started about 6 months ago. R shoulder has been bothering him for tha past year. Raising his arm up into flexion causes R anterior shoulder. Pt also states having low back pain. Aggravating factors include putting much in his yard. Has a hx of arthritis. 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 His general practitioner suggested that he might  have a frozen shoulder.  Pt is R hand dominand.  Pt states that he has not been keeping up with his stretches.  Back pain has been off and on for several years. Had chiropractic treatment which helped.  Has not had surgeries for his R shoulder, R knee and low back. Denies LE paresthesia, or loss of bowel or bladder control.    Limitations  Sitting;Lifting;House hold activities;Standing;Walking    How long can you sit comfortably?     How long can you stand comfortably?     How long can you walk comfortably?     Diagnostic tests  None    Patient Stated Goals  Wants to be able to play golf, make his pain more comfortable. Be able to keep up with grandchildren.    Currently in Pain?  Yes    Pain Score  --   no number given, but reported very little pain   Pain Orientation  Right    Pain Descriptors / Indicators  Sharp    Pain Type  Chronic pain    Pain Onset  More than a month ago    Pain Frequency  Occasional        Exercises:  arm bike for warmup, no resistance x5 minutes, pt cued for form, monitored throughout.  ER and IR step outs with  RTB x10 on RUE multimodal cueing for  Form, pt reported more  Scapular rows with GTB x30  Scapular rows advanced with GTB x30  Core activation with RTB, twist from L rotation to nuetral x15  Core activation with RTB twist from R rotation to neutral x15  Standing R bicep curls x20  Standing R shoulder flexion with RTB to 90deg x20  Standing R shoulder scaption with RTB to 90 deg x20, pt unable to perform true abduction due to pain and shoulder ROM limitations  Golf club swinging x5 minutes with varying clubs, discussion of compensations, form.  Pt response/clinical impression: The patient needed multimodal cueing throughout session to maintain exercise form/technique, complained of pain with IR step outs and attempted shoulder abduction, exercises modified accordingly. Pt able to swing golf club with compensation to avoid pain,  but reported his back pain will still limit his ability to golf, would benefit from further core activation and postural awareness. The patient was eager for HEP, instructed he needed more practice with certain exercises with PT guidance, but informed he could perform bicep curls, shoulder flexion, and scaption if he would like to try some.       PT Education - 06/17/19 0947    Education provided  Yes    Education Details  therex, HEP    Person(s) Educated  Patient    Methods  Explanation;Demonstration;Tactile cues;Verbal cues;Handout    Comprehension  Verbalized understanding;Returned demonstration       PT Short Term Goals - 06/12/19 2197      PT SHORT TERM GOAL #1   Title  Patient will be independent with his HEP to decrease R shoulder, R knee and low back pain as well as to decrease difficulty reaching, standing up from prolonged sitting, and performing yard work.    Baseline  Pt has started his HEP (05/12/2019); 5/6 pt reported that he needs to be doing them more consistently. Has questions about what he should be doing moving forward.    Time  4    Period  Weeks    Status  On-going    Target Date  07/10/19        PT Long Term Goals - 06/12/19 0938      PT LONG TERM GOAL #1   Title  Patient will have a decrease in R shoulder pain to 3/10 or less at worst to promote ability to reach, perform yard work, play golf more comfortably.    Baseline  7/10 R shoulder pain at most for the past 3 months (05/12/2019); 06/12/2019 Pt reported worse pain is a 5-6/10 especially with a sudden movement.    Time  4    Period  Weeks    Status  On-going    Target Date  07/10/19      PT LONG TERM GOAL #2   Title  Pt will have a decrease in R knee pain to 2/10 or less at worst to promote ability to stand up after prolonged sitting, as well as perform yardwork more comfortably.    Baseline  5/10 R knee pain at most for the past 3 months (05/12/2019); Patient reported his knee pain is okay especially  since the injection.    Time  4    Period  Weeks    Status  Achieved    Target Date  07/10/19      PT LONG TERM GOAL #3   Title  Patient will have a decrease in low back pain to 1/10  or less at most to promote ability to perform standing tasks as well as yard work more comfortably.    Baseline  4/10 back pain at most for the past 3 months (05/12/2019); Pt stated his back pain is not bad, at times his pain will maybe be a 2/10    Time  4    Period  Weeks    Status  Achieved    Target Date  07/10/19      PT LONG TERM GOAL #4   Title  Pt will improve his shoulder FOTO by at least 10 points as a demonstration of improved function.    Baseline  shoulder FOTO: 50 (05/12/2019); shoulder FOTO 58 06/12/2019    Time  4    Period  Weeks    Status  On-going    Target Date  07/10/19      PT LONG TERM GOAL #5   Title  Pt will improve R shoulder ER and IR strength by at least 1/2 MMT grade to promote ability to raise his R arm with less pain.    Baseline  R shoulder ER and IR 4/5 (05/12/2019); Patient reported pain with ER and IR testing of RUE, ER 3+/5, IR 4/5    Time  4    Period  Weeks    Status  New    Target Date  07/10/19            Plan - 06/17/19 0951    Clinical Impression Statement  The patient needed multimodal cueing throughout session to maintain exercise form/technique, complained of pain with IR step outs and attempted shoulder abduction, exercises modified accordingly. Pt able to swing golf club with compensation to avoid pain, but reported his back pain will still limit his ability to golf, would benefit from further core activation and postural awareness. The patient was eager for HEP, instructed he needed more practice with certain exercises with PT guidance, but informed he could perform bicep curls, shoulder flexion, and scaption if he would like to try some.    Personal Factors and Comorbidities  Age;Past/Current Experience;Time since onset of  injury/illness/exacerbation;Comorbidity 1    Comorbidities  Hx of HTN    Examination-Activity Limitations  Bend;Sit;Transfers;Lift    Stability/Clinical Decision Making  Evolving/Moderate complexity    Rehab Potential  Fair    Clinical Impairments Affecting Rehab Potential  (+) motivated, likes to be active; (-) Chronicity of condition, age, multiple areas of pain    PT Frequency  2x / week    PT Duration  4 weeks    PT Treatment/Interventions  Therapeutic activities;Therapeutic exercise;Neuromuscular re-education;Patient/family education;Manual techniques;Dry needling;Spinal Manipulations;Joint Manipulations;Aquatic Therapy;Electrical Stimulation;Iontophoresis 4mg /ml Dexamethasone;Traction    PT Next Visit Plan  golf swing, updated HEP; pt wants more exercises for home; several exercises performed but pt needs further guidance onf form, try next session to formally administer    PT Home Exercise Plan  Medbridge Access Code [email or web address]; updated    Consulted and Agree with Plan of Care  Patient       Patient will benefit from skilled therapeutic intervention in order to improve the following deficits and impairments:  Pain, Postural dysfunction, Impaired UE functional use, Improper body mechanics, Impaired flexibility, Hypomobility, Decreased strength, Decreased range of motion  Visit Diagnosis: Chronic right shoulder pain  Muscle weakness (generalized)  Chronic pain of right knee  Pain in left leg  Acute pain of left knee  Chronic bilateral low back pain, unspecified whether sciatica present  Pain in left  hip     Problem List Patient Active Problem List   Diagnosis Date Noted  . Disorder of bursae of shoulder region 12/11/2018  . Contusion of shoulder region 12/11/2018  . Right inguinal hernia 03/20/2014  . Benign essential hypertension 05/15/2013  . Ischemic cardiomyopathy 03/14/2013  . Pure hypercholesterolemia 03/14/2013  . Coronary artery disease involving native coronary  artery of native heart without angina pectoris 03/07/2013  . Diabetes mellitus ([facility]) 03/07/2013  . GERD (gastroesophageal reflux disease) 03/07/2013  . STEMI (ST elevation myocardial infarction) ([facility]) 03/07/2013    Olga Coaster PT, DPT 10:37 AM,06/17/19   [place] [facility] [address] [place], [address] Phone: [phone]   Fax:  [phone]  Name: Gabriel Butler MRN: [id number] Date of Birth: [birth year]

## 2019-06-19 ENCOUNTER — Ambulatory Visit: Payer: 59

## 2019-06-19 ENCOUNTER — Telehealth: Payer: Self-pay

## 2019-06-19 ENCOUNTER — Other Ambulatory Visit: Payer: Self-pay | Admitting: Physician Assistant

## 2019-06-19 ENCOUNTER — Other Ambulatory Visit: Payer: Self-pay

## 2019-06-19 DIAGNOSIS — M25561 Pain in right knee: Secondary | ICD-10-CM | POA: Diagnosis not present

## 2019-06-19 DIAGNOSIS — M25562 Pain in left knee: Secondary | ICD-10-CM | POA: Diagnosis not present

## 2019-06-19 DIAGNOSIS — F419 Anxiety disorder, unspecified: Secondary | ICD-10-CM

## 2019-06-19 DIAGNOSIS — G8929 Other chronic pain: Secondary | ICD-10-CM | POA: Diagnosis not present

## 2019-06-19 DIAGNOSIS — M79605 Pain in left leg: Secondary | ICD-10-CM | POA: Diagnosis not present

## 2019-06-19 DIAGNOSIS — M25511 Pain in right shoulder: Secondary | ICD-10-CM | POA: Diagnosis not present

## 2019-06-19 DIAGNOSIS — M25552 Pain in left hip: Secondary | ICD-10-CM | POA: Diagnosis not present

## 2019-06-19 DIAGNOSIS — M6281 Muscle weakness (generalized): Secondary | ICD-10-CM | POA: Diagnosis not present

## 2019-06-19 DIAGNOSIS — M545 Low back pain: Secondary | ICD-10-CM | POA: Diagnosis not present

## 2019-06-19 NOTE — Telephone Encounter (Signed)
Patient calling to request refill on prescription of alprazolam 0.5mg  2 tabs a day for anxiety.

## 2019-06-19 NOTE — Therapy (Signed)
[facility] [address], [place], [address] Phone: [phone]   Fax:  [phone]  Physical Therapy Treatment  Patient Details  Name: Gabriel Butler MRN: [id number] Date of Birth: 02/03/1951 Referring Provider (PT): Cassell Smiles, MD   Encounter Date: 06/19/2019  PT End of Session - 06/19/19 0948    Visit Number  12    Number of Visits  17    Date for PT Re-Evaluation  07/10/19    Authorization Type  2    Authorization Time Period  of 10 progress report    PT Start Time  0949    PT Stop Time  1037    PT Time Calculation (min)  48 min    Activity Tolerance  Patient tolerated treatment well    Behavior During Therapy  [facility] for tasks assessed/performed       Past Medical History:  Diagnosis Date  . GERD (gastroesophageal reflux disease)   . Heart attack (HCC) 03/05/2013   Coronary stent placement.  . Hypertension     Past Surgical History:  Procedure Laterality Date  . COLONOSCOPY  2013  . CORONARY ANGIOPLASTY WITH STENT PLACEMENT    . HERNIA REPAIR Right 03/26/14   Direct and indirect hernia noted, large ultra Pro mesh utilized.    There were no vitals filed for this visit.  Subjective Assessment - 06/19/19 0950    Subjective  R shoulder is ok. Has not done a lot today. Sat in front of the computer since 6:30 am.  No pain at rest. 4/10 with shoulder flexion. The exercises to strengthen his muscles are beginning to pay off some.    Pertinent History  R knee and R shoulder pain. Pt had an injection in R knee (cortisone) about a month ago which has helped substantially. R knee pain started about 6 months ago. R shoulder has been bothering him for tha past year. Raising his arm up into flexion causes R anterior shoulder. Pt also states having low back pain. Aggravating factors include putting much in his yard. Has a hx of arthritis. 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 His general practitioner suggested that he might have a frozen shoulder.  Pt is R hand dominand.  Pt states that he has not been keeping up with his stretches.  Back pain has been off and on for several years. Had chiropractic treatment which helped.  Has not had surgeries for his R shoulder, R knee and low back. Denies LE paresthesia, or loss of bowel or bladder control.    Limitations  Sitting;Lifting;House hold activities;Standing;Walking    How long can you sit comfortably?     How long can you stand comfortably?     How long can you walk comfortably?     Diagnostic tests  None    Patient Stated Goals  Wants to be able to play golf, make his pain more comfortable. Be able to keep up with grandchildren.    Currently in Pain?  Yes    Pain Score  4     Pain Onset  More than a month ago                                  PT Education - 06/19/19 1048    Education provided  Yes  Education Details  ther-ex, HEP    Person(s) Educated  Patient    Methods  Explanation;Demonstration;Tactile cues;Verbal cues;Handout    Comprehension  Returned demonstration;Verbalized understanding      Objective   no blood pressure problems and no latex band allergies per pt  Pt adds also feeling muscle cramps B lateral thigh as well as medial thigh  MedbridgeAccess Code [email or web address]   Manual therapy Supine with R arm in abduction   Inferior lateral glide to R humeral head grade 3+   Supine with arm in abduction  STM to teres major and subscapularis muscle to decrease tension   Taping to promote R scapular retraction. Minimal to no difference with comfort level with R shoulder flexion observed afterwards.      Therapeutic Exercises:   Standing shoulder flexion AROM 118 degrees  Supine shoulder flexion AROM 140 degrees  Supine R shoulder flexion with red band 10x2  Supine R shoulder  scaption with red band 10x2 with PT assist for proper movement    Supine with R arm in scaption  Gentle manually resisted ER 10x5 seconds  Gentle manually resisted elbow flexion 10x5 seconds  Standing R shoulder extension with scapular retraction with palm facing forward green band 10x3  Standing B shoulder extension with scapular retraction green band 10x5 seconds  Door pect stretch 30 seconds x 3  Reviewed and given as part of his HEP. Pt demonstrated and verbalized understanding. Handout provided.   Improved exercise technique, movement at target joints, use of target muscles after min to mod verbal, visual, tactile cues.     Pt response/clinical impression:  Continued working on decreasing joint and soft tissue restrictions to flexion, scaption, ER and IR ROM. Continued working on scapular strengthening and decreasing pectoralis muscle tension to promote scapular retraction when raising his arm up against gravity. Pt demonstrates better ability to perform shoulder flexion in supine (less gravitational resistance) compared to standing. Pt tolerated session well without aggravation of symptoms. Pt will benefit from continued skilled physical therapy services to decrease pain, improve ROM, strength, and function.       PT Short Term Goals - 06/12/19 2111      PT SHORT TERM GOAL #1   Title  Patient will be independent with his HEP to decrease R shoulder, R knee and low back pain as well as to decrease difficulty reaching, standing up from prolonged sitting, and performing yard work.    Baseline  Pt has started his HEP (05/12/2019); 5/6 pt reported that he needs to be doing them more consistently. Has questions about what he should be doing moving forward.    Time  4    Period  Weeks    Status  On-going    Target Date  07/10/19        PT Long Term Goals - 06/12/19 0938      PT LONG TERM GOAL #1   Title  Patient will have a decrease in R shoulder pain to 3/10 or less at worst to  promote ability to reach, perform yard work, play golf more comfortably.    Baseline  7/10 R shoulder pain at most for the past 3 months (05/12/2019); 06/12/2019 Pt reported worse pain is a 5-6/10 especially with a sudden movement.    Time  4    Period  Weeks    Status  On-going    Target Date  07/10/19      PT LONG TERM GOAL #2   Title  Pt will  have a decrease in R knee pain to 2/10 or less at worst to promote ability to stand up after prolonged sitting, as well as perform yardwork more comfortably.    Baseline  5/10 R knee pain at most for the past 3 months (05/12/2019); Patient reported his knee pain is okay especially since the injection.    Time  4    Period  Weeks    Status  Achieved    Target Date  07/10/19      PT LONG TERM GOAL #3   Title  Patient will have a decrease in low back pain to 1/10 or less at most to promote ability to perform standing tasks as well as yard work more comfortably.    Baseline  4/10 back pain at most for the past 3 months (05/12/2019); Pt stated his back pain is not bad, at times his pain will maybe be a 2/10    Time  4    Period  Weeks    Status  Achieved    Target Date  07/10/19      PT LONG TERM GOAL #4   Title  Pt will improve his shoulder FOTO by at least 10 points as a demonstration of improved function.    Baseline  shoulder FOTO: 50 (05/12/2019); shoulder FOTO 58 06/12/2019    Time  4    Period  Weeks    Status  On-going    Target Date  07/10/19      PT LONG TERM GOAL #5   Title  Pt will improve R shoulder ER and IR strength by at least 1/2 MMT grade to promote ability to raise his R arm with less pain.    Baseline  R shoulder ER and IR 4/5 (05/12/2019); Patient reported pain with ER and IR testing of RUE, ER 3+/5, IR 4/5    Time  4    Period  Weeks    Status  New    Target Date  07/10/19            Plan - 06/19/19 1048    Clinical Impression Statement  Continued working on decreasing joint and soft tissue restrictions to flexion, scaption,  ER and IR ROM. Continued working on scapular strengthening and decreasing pectoralis muscle tension to promote scapular retraction when raising his arm up against gravity. Pt demonstrates better ability to perform shoulder flexion in supine (less gravitational resistance) compared to standing. Pt tolerated session well without aggravation of symptoms. Pt will benefit from continued skilled physical therapy services to decrease pain, improve ROM, strength, and function.    Personal Factors and Comorbidities  Age;Past/Current Experience;Time since onset of injury/illness/exacerbation;Comorbidity 1    Comorbidities  Hx of HTN    Examination-Activity Limitations  Bend;Sit;Transfers;Lift    Stability/Clinical Decision Making  Evolving/Moderate complexity    Rehab Potential  Fair    Clinical Impairments Affecting Rehab Potential  (+) motivated, likes to be active; (-) Chronicity of condition, age, multiple areas of pain    PT Frequency  2x / week    PT Duration  4 weeks    PT Treatment/Interventions  Therapeutic activities;Therapeutic exercise;Neuromuscular re-education;Patient/family education;Manual techniques;Dry needling;Spinal Manipulations;Joint Manipulations;Aquatic Therapy;Electrical Stimulation;Iontophoresis 4mg /ml Dexamethasone;Traction    PT Next Visit Plan  golf swing, updated HEP; pt wants more exercises for home; several exercises performed but pt needs further guidance onf form, try next session to formally administer    PT Home Exercise Plan  Medbridge Access Code [facility]; updated  Consulted and Agree with Plan of Care  Patient       Patient will benefit from skilled therapeutic intervention in order to improve the following deficits and impairments:  Pain, Postural dysfunction, Impaired UE functional use, Improper body mechanics, Impaired flexibility, Hypomobility, Decreased strength, Decreased range of motion  Visit Diagnosis: Chronic right shoulder pain  Muscle weakness  (generalized)     Problem List Patient Active Problem List   Diagnosis Date Noted  . Disorder of bursae of shoulder region 12/11/2018  . Contusion of shoulder region 12/11/2018  . Right inguinal hernia 03/20/2014  . Benign essential hypertension 05/15/2013  . Ischemic cardiomyopathy 03/14/2013  . Pure hypercholesterolemia 03/14/2013  . Coronary artery disease involving native coronary artery of native heart without angina pectoris 03/07/2013  . Diabetes mellitus ([place]) 03/07/2013  . GERD (gastroesophageal reflux disease) 03/07/2013  . STEMI (ST elevation myocardial infarction) ([place]) 03/07/2013    Joneen Boers PT, DPT   06/19/2019, 11:01 AM  [place] [facility] [address], [place], [address] Phone: [phone]   Fax:  [phone]  Name: Gabriel Butler MRN: [id number] Date of Birth: [birth year]

## 2019-06-19 NOTE — Patient Instructions (Signed)
Access Code: [email or web address] URL: [email or web address] Date: 06/19/2019 Prepared by: Loralyn Freshwater  Exercises Seated Shoulder Inferior Glide - 1 x daily - 7 x weekly - 1 sets - 10 reps - 5 seconds hold Supine Shoulder Flexion Extension Full Range AROM - 3 x daily - 7 x weekly - 3 sets - 10 reps Seated Shoulder External Rotation - 1 x daily - 7 x weekly - 10 reps - 3 sets Standing Isometric Shoulder External Rotation with Doorway - 1 x daily - 7 x weekly - 10 reps - 3 sets Standing Isometric Shoulder Internal Rotation at Doorway - 1 x daily - 7 x weekly - 10 reps - 3 sets Seated Thoracic Lumbar Extension - 1 x daily - 7 x weekly - 3 sets - 10 reps - 5 seconds hold Prone Scapular Slide with Shoulder Extension - 1 x daily - 4 x weekly - 3 sets - 10 reps Standing Shoulder Row with Anchored Resistance - 1 x daily - 4 x weekly - 3 sets - 10 reps Scapular Retraction with Resistance Advanced - 1 x daily - 4 x weekly - 3 sets - 10 reps Doorway Pec Stretch at 60 Degrees Abduction with Arm Straight - 3 x daily - 7 x weekly - 1 sets - 4 reps - 30 seconds hold

## 2019-06-24 ENCOUNTER — Ambulatory Visit: Payer: Self-pay | Admitting: Physician Assistant

## 2019-06-24 ENCOUNTER — Encounter: Payer: Self-pay | Admitting: Physician Assistant

## 2019-06-24 ENCOUNTER — Ambulatory Visit: Payer: 59

## 2019-06-24 ENCOUNTER — Other Ambulatory Visit: Payer: Self-pay

## 2019-06-24 VITALS — BP 149/95 | HR 75 | Temp 97.6°F

## 2019-06-24 DIAGNOSIS — M79605 Pain in left leg: Secondary | ICD-10-CM | POA: Diagnosis not present

## 2019-06-24 DIAGNOSIS — M545 Low back pain: Secondary | ICD-10-CM | POA: Diagnosis not present

## 2019-06-24 DIAGNOSIS — M25561 Pain in right knee: Secondary | ICD-10-CM | POA: Diagnosis not present

## 2019-06-24 DIAGNOSIS — F419 Anxiety disorder, unspecified: Secondary | ICD-10-CM

## 2019-06-24 DIAGNOSIS — M6281 Muscle weakness (generalized): Secondary | ICD-10-CM | POA: Diagnosis not present

## 2019-06-24 DIAGNOSIS — G8929 Other chronic pain: Secondary | ICD-10-CM

## 2019-06-24 DIAGNOSIS — M25562 Pain in left knee: Secondary | ICD-10-CM | POA: Diagnosis not present

## 2019-06-24 DIAGNOSIS — M25552 Pain in left hip: Secondary | ICD-10-CM | POA: Diagnosis not present

## 2019-06-24 DIAGNOSIS — M25511 Pain in right shoulder: Secondary | ICD-10-CM | POA: Diagnosis not present

## 2019-06-24 MED ORDER — ALPRAZOLAM ER 0.5 MG PO TB24
0.5000 mg | ORAL_TABLET | Freq: Every day | ORAL | 0 refills | Status: DC
Start: 2019-06-24 — End: 2019-10-20

## 2019-06-24 NOTE — Progress Notes (Signed)
   Subjective: Medication refill for anxiety    Patient ID: Gabriel Butler, male    DOB: [birth year], 69 y.o.   MRN: [id number]  HPI Patient presents for medication refill of Xanax for his anxiety.  Patient relates increased of work and personal stress in the past 2 weeks.  Patient last dose of medication was last week.   Review of Systems   Anxiety, diabetes, hyperlipidemia, and hypertension. Objective:   Physical Exam Patient appears no acute distress.  HEENT is unremarkable.  Neck was supple for adenopathy or bruits.  Lungs are clear to auscultation heart regular rate and rhythm.       Assessment & Plan: Medication refill  Patient prescribed Xanax 0.5 mg twice daily as needed.  Advised to follow-up if no improvement or worsening of complaint.Marland Kitchen

## 2019-06-24 NOTE — Progress Notes (Signed)
Pt here for Rx refill.

## 2019-06-24 NOTE — Therapy (Signed)
[facility] [address] [place], [address] Phone: [phone]   Fax:  [phone]  Physical Therapy Treatment  Patient Details  Name: Gabriel Butler MRN: [id number] Date of Birth: [birth year] Referring Provider (PT): Cassell Smiles, MD   Encounter Date: 06/24/2019  PT End of Session - 06/24/19 0946    Visit Number  13    Number of Visits  17    Date for PT Re-Evaluation  07/10/19    Authorization Type  3    Authorization Time Period  of 10 progress report    PT Start Time  0946    PT Stop Time  1029    PT Time Calculation (min)  43 min    Activity Tolerance  Patient tolerated treatment well    Behavior During Therapy  [facility] for tasks assessed/performed       Past Medical History:  Diagnosis Date  . GERD (gastroesophageal reflux disease)   . Heart attack (HCC) 03/05/2013   Coronary stent placement.  . Hypertension     Past Surgical History:  Procedure Laterality Date  . COLONOSCOPY  2013  . CORONARY ANGIOPLASTY WITH STENT PLACEMENT    . HERNIA REPAIR Right 03/26/14   Direct and indirect hernia noted, large ultra Pro mesh utilized.    There were no vitals filed for this visit.  Subjective Assessment - 06/24/19 0947    Subjective  The tape for his R shoulder helped. Not a lot of pain with shoulder flexion. Pt also states that he has kinesio tape at home and tried to tape his shoulder along the same area as the tape. Was sticky. Does not have allergies to adhesives.    Pertinent History  R knee and R shoulder pain. Pt had an injection in R knee (cortisone) about a month ago which has helped substantially. R knee pain started about 6 months ago. R shoulder has been bothering him for tha past year. Raising his arm up into flexion causes R anterior shoulder. Pt also states having low back pain. Aggravating factors include putting much in his yard. Has a hx of arthritis. 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 His general practitioner suggested that he might have a frozen shoulder.  Pt is R hand dominand.  Pt states that he has not been keeping up with his stretches.  Back pain has been off and on for several years. Had chiropractic treatment which helped.  Has not had surgeries for his R shoulder, R knee and low back. Denies LE paresthesia, or loss of bowel or bladder control.    Limitations  Sitting;Lifting;House hold activities;Standing;Walking    How long can you sit comfortably?     How long can you stand comfortably?     How long can you walk comfortably?     Diagnostic tests  None    Patient Stated Goals  Wants to be able to play golf, make his pain more comfortable. Be able to keep up with grandchildren.    Currently in Pain?  Yes    Pain Score  --   "not a lot"   Pain Onset  More than a month ago                                PT Education - 06/24/19 1048  Education provided  Yes    Education Details  ther-ex    Person(s) Educated  Patient    Methods  Explanation;Demonstration;Tactile cues;Verbal cues    Comprehension  Returned demonstration;Verbalized understanding      Objective   no blood pressure problems and no latex band allergies per pt  Pt adds also feeling muscle cramps B lateral thigh as well as medial thigh  MedbridgeAccess Code [email or web address]   Manual therapy   Supine with R arm in abduction              Inferior, inferior lateral, posterior glide to R humeral head grade 3+   Supine with arm in abduction             STM to pectoralis,  teres major and subscapularis muscle to decrease tension   Irritation R scapula observed along the lower trap area.  Taping to promote R shoulder ER (kinesio tape to infraspinatus) to avoid irritated area.    Therapeutic Exercises:   R shoulder ER stretch with dowel, back against  wall 10x3 with 5 second holds  Standing R cross arm stretch at door 8x15 seconds  shoulder ER yellow band 10x3 Towel slides flexion 10x  Then with step back 10x5 seconds  Towel slides scaption 10x. crepitus  121 degrees R shoulder flexion AROM after session.   Improved exercise technique, movement at target joints, use of target muscles after min to mod verbal, visual, tactile cues.     Pt response/clinical impression:  Improving right glenohumeral joint mobility palpated. Pt still demonstrates limited ER, flexion, and abduction AROM. Continued working on improving joint mobility, mechanics, strength, and decreasing soft tissue restrictions around his shoulder to promote ability to raise his arm up more comfortably. Pt tolerated session well without aggravation of symptoms. Pt will benefit from continued skilled physical therapy services to decrease pain, improve ROM, strength and function.      PT Short Term Goals - 06/12/19 6734      PT SHORT TERM GOAL #1   Title  Patient will be independent with his HEP to decrease R shoulder, R knee and low back pain as well as to decrease difficulty reaching, standing up from prolonged sitting, and performing yard work.    Baseline  Pt has started his HEP (05/12/2019); 5/6 pt reported that he needs to be doing them more consistently. Has questions about what he should be doing moving forward.    Time  4    Period  Weeks    Status  On-going    Target Date  07/10/19        PT Long Term Goals - 06/12/19 0938      PT LONG TERM GOAL #1   Title  Patient will have a decrease in R shoulder pain to 3/10 or less at worst to promote ability to reach, perform yard work, play golf more comfortably.    Baseline  7/10 R shoulder pain at most for the past 3 months (05/12/2019); 06/12/2019 Pt reported worse pain is a 5-6/10 especially with a sudden movement.    Time  4    Period  Weeks    Status  On-going    Target Date  07/10/19      PT LONG TERM GOAL #2    Title  Pt will have a decrease in R knee pain to 2/10 or less at worst to promote ability to stand up after prolonged sitting, as well as perform yardwork more comfortably.  Baseline  5/10 R knee pain at most for the past 3 months (05/12/2019); Patient reported his knee pain is okay especially since the injection.    Time  4    Period  Weeks    Status  Achieved    Target Date  07/10/19      PT LONG TERM GOAL #3   Title  Patient will have a decrease in low back pain to 1/10 or less at most to promote ability to perform standing tasks as well as yard work more comfortably.    Baseline  4/10 back pain at most for the past 3 months (05/12/2019); Pt stated his back pain is not bad, at times his pain will maybe be a 2/10    Time  4    Period  Weeks    Status  Achieved    Target Date  07/10/19      PT LONG TERM GOAL #4   Title  Pt will improve his shoulder FOTO by at least 10 points as a demonstration of improved function.    Baseline  shoulder FOTO: 50 (05/12/2019); shoulder FOTO 58 06/12/2019    Time  4    Period  Weeks    Status  On-going    Target Date  07/10/19      PT LONG TERM GOAL #5   Title  Pt will improve R shoulder ER and IR strength by at least 1/2 MMT grade to promote ability to raise his R arm with less pain.    Baseline  R shoulder ER and IR 4/5 (05/12/2019); Patient reported pain with ER and IR testing of RUE, ER 3+/5, IR 4/5    Time  4    Period  Weeks    Status  New    Target Date  07/10/19            Plan - 06/24/19 0946    Clinical Impression Statement  Improving right glenohumeral joint mobility palpated. Pt still demonstrates limited ER, flexion, and abduction AROM. Continued working on improving joint mobility, mechanics, strength, and decreasing soft tissue restrictions around his shoulder to promote ability to raise his arm up more comfortably. Pt tolerated session well without aggravation of symptoms. Pt will benefit from continued skilled physical therapy  services to decrease pain, improve ROM, strength and function.    Personal Factors and Comorbidities  Age;Past/Current Experience;Time since onset of injury/illness/exacerbation;Comorbidity 1    Comorbidities  Hx of HTN    Examination-Activity Limitations  Bend;Sit;Transfers;Lift    Stability/Clinical Decision Making  Evolving/Moderate complexity    Rehab Potential  Fair    Clinical Impairments Affecting Rehab Potential  (+) motivated, likes to be active; (-) Chronicity of condition, age, multiple areas of pain    PT Frequency  2x / week    PT Duration  4 weeks    PT Treatment/Interventions  Therapeutic activities;Therapeutic exercise;Neuromuscular re-education;Patient/family education;Manual techniques;Dry needling;Spinal Manipulations;Joint Manipulations;Aquatic Therapy;Electrical Stimulation;Iontophoresis 4mg /ml Dexamethasone;Traction    PT Next Visit Plan  golf swing, updated HEP; pt wants more exercises for home; several exercises performed but pt needs further guidance onf form, try next session to formally administer    PT Home Exercise Plan  Medbridge Access Code [email or web address]; updated    Consulted and Agree with Plan of Care  Patient       Patient will benefit from skilled therapeutic intervention in order to improve the following deficits and impairments:  Pain, Postural dysfunction, Impaired UE functional use, Improper body mechanics, Impaired flexibility,  Hypomobility, Decreased strength, Decreased range of motion  Visit Diagnosis: Chronic right shoulder pain  Muscle weakness (generalized)     Problem List Patient Active Problem List   Diagnosis Date Noted  . Disorder of bursae of shoulder region 12/11/2018  . Contusion of shoulder region 12/11/2018  . Right inguinal hernia 03/20/2014  . Benign essential hypertension 05/15/2013  . Ischemic cardiomyopathy 03/14/2013  . Pure hypercholesterolemia 03/14/2013  . Coronary artery disease involving native coronary artery of native  heart without angina pectoris 03/07/2013  . Diabetes mellitus ([place]) 03/07/2013  . GERD (gastroesophageal reflux disease) 03/07/2013  . STEMI (ST elevation myocardial infarction) ([place]) 03/07/2013    Joneen Boers PT, DPT   06/24/2019, 10:50 AM  [place] [facility] [address], [place], [address] Phone: [phone]   Fax:  [phone]  Name: Dominyk Law MRN: [id number] Date of Birth: Oct 07, 1950

## 2019-06-26 ENCOUNTER — Other Ambulatory Visit: Payer: Self-pay

## 2019-06-26 ENCOUNTER — Ambulatory Visit: Payer: 59

## 2019-06-26 DIAGNOSIS — M25511 Pain in right shoulder: Secondary | ICD-10-CM | POA: Diagnosis not present

## 2019-06-26 DIAGNOSIS — M545 Low back pain: Secondary | ICD-10-CM | POA: Diagnosis not present

## 2019-06-26 DIAGNOSIS — M25562 Pain in left knee: Secondary | ICD-10-CM | POA: Diagnosis not present

## 2019-06-26 DIAGNOSIS — G8929 Other chronic pain: Secondary | ICD-10-CM

## 2019-06-26 DIAGNOSIS — M25561 Pain in right knee: Secondary | ICD-10-CM | POA: Diagnosis not present

## 2019-06-26 DIAGNOSIS — M79605 Pain in left leg: Secondary | ICD-10-CM | POA: Diagnosis not present

## 2019-06-26 DIAGNOSIS — M25552 Pain in left hip: Secondary | ICD-10-CM | POA: Diagnosis not present

## 2019-06-26 DIAGNOSIS — M6281 Muscle weakness (generalized): Secondary | ICD-10-CM

## 2019-06-26 NOTE — Therapy (Signed)
[facility] AND [facility] [address] [place], [address] Phone: [phone]   Fax:  [phone]  Physical Therapy Treatment  Patient Details  Name: Gabriel Butler MRN: [id number] Date of Birth: December 28, 1950 Referring Provider (PT): Cassell Smiles, MD   Encounter Date: 06/26/2019  PT End of Session - 06/26/19 0952    Visit Number  14    Number of Visits  17    Date for PT Re-Evaluation  07/10/19    Authorization Type  4    Authorization Time Period  of 10 progress report    PT Start Time  0951    PT Stop Time  1024    PT Time Calculation (min)  33 min    Activity Tolerance  Patient tolerated treatment well    Behavior During Therapy  [facility] for tasks assessed/performed       Past Medical History:  Diagnosis Date  . GERD (gastroesophageal reflux disease)   . Heart attack (HCC) 03/05/2013   Coronary stent placement.  . Hypertension     Past Surgical History:  Procedure Laterality Date  . COLONOSCOPY  2013  . CORONARY ANGIOPLASTY WITH STENT PLACEMENT    . HERNIA REPAIR Right 03/26/14   Direct and indirect hernia noted, large ultra Pro mesh utilized.    There were no vitals filed for this visit.  Subjective Assessment - 06/26/19 0952    Subjective  R shoulder is a little sore today. Has not really done anything except sitting at the computer. 2-3/10 R shoulder pain with flexion.  Needs to be done around 10:25    Pertinent History  R knee and R shoulder pain. Pt had an injection in R knee (cortisone) about a month ago which has helped substantially. R knee pain started about 6 months ago. R shoulder has been bothering him for tha past year. Raising his arm up into flexion causes R anterior shoulder. Pt also states having low back pain. Aggravating factors include putting much in his yard. Has a hx of arthritis. 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 His general practitioner suggested that he might have a frozen shoulder.  Pt is R hand dominand.  Pt states that he has not been keeping up with his stretches.  Back pain has been off and on for several years. Had chiropractic treatment which helped.  Has not had surgeries for his R shoulder, R knee and low back. Denies LE paresthesia, or loss of bowel or bladder control.    Limitations  Sitting;Lifting;House hold activities;Standing;Walking    How long can you sit comfortably?     How long can you stand comfortably?     How long can you walk comfortably?     Diagnostic tests  None    Patient Stated Goals  Wants to be able to play golf, make his pain more comfortable. Be able to keep up with grandchildren.    Currently in Pain?  Yes    Pain Score  3    2-3/10   Pain Onset  More than a month ago                                PT Education - 06/26/19 1240    Education provided  Yes    Education Details  ther-ex  Person(s) Educated  Patient    Methods  Explanation;Demonstration;Tactile cues;Verbal cues    Comprehension  Returned demonstration;Verbalized understanding      Objective   no blood pressure problems and no latex band allergies per pt  Pt adds also feeling muscle cramps B lateral thigh as well as medial thigh  MedbridgeAccess Code [email or web address]   Manual therapy  Sitting with R arm propped around 75 degrees scaption  Posterior inferior joint mob grade 3+   then with MWM into ER with PT assist 10x2, then 6x with posterior inferior pressure with joint distraction  Then with STM to R teres major muscle  TherapeuticExercises:  Standing R shoulder ER  With distraction with PT 10x, Feels good.   With distraction using 5 lbs wrist weight at distal arm and with towel roll under axilla for lateral distraction, resisting yellow band 10x3  No crepitus  Shoulder IR yellow band 10x3  No shoulder  crepitus   Shoulder extension yellow band 10x, red 30   Improved exercise technique, movement at target joints, use of target muscles after min to mod verbal, visual, tactile cues.     Pt response/clinical impression: Improved comfort with ER movements and exercises with addition of inferior and lateral distraction of R humeral head. Continued working in improving glenohumeral joint mobility and rotator cuff and scapular strengthening to promote proper shoulder mechanics. Difficulty with proper scapular movements with arm motions. Pt tolerated session well without aggravation of symptoms. Pt will benefit from continued skilled physical therapy services to decrease pain, improve ROM and function.      PT Short Term Goals - 06/12/19 7673      PT SHORT TERM GOAL #1   Title  Patient will be independent with his HEP to decrease R shoulder, R knee and low back pain as well as to decrease difficulty reaching, standing up from prolonged sitting, and performing yard work.    Baseline  Pt has started his HEP (05/12/2019); 5/6 pt reported that he needs to be doing them more consistently. Has questions about what he should be doing moving forward.    Time  4    Period  Weeks    Status  On-going    Target Date  07/10/19        PT Long Term Goals - 06/12/19 0938      PT LONG TERM GOAL #1   Title  Patient will have a decrease in R shoulder pain to 3/10 or less at worst to promote ability to reach, perform yard work, play golf more comfortably.    Baseline  7/10 R shoulder pain at most for the past 3 months (05/12/2019); 06/12/2019 Pt reported worse pain is a 5-6/10 especially with a sudden movement.    Time  4    Period  Weeks    Status  On-going    Target Date  07/10/19      PT LONG TERM GOAL #2   Title  Pt will have a decrease in R knee pain to 2/10 or less at worst to promote ability to stand up after prolonged sitting, as well as perform yardwork more comfortably.    Baseline  5/10 R  knee pain at most for the past 3 months (05/12/2019); Patient reported his knee pain is okay especially since the injection.    Time  4    Period  Weeks    Status  Achieved    Target Date  07/10/19      PT LONG  TERM GOAL #3   Title  Patient will have a decrease in low back pain to 1/10 or less at most to promote ability to perform standing tasks as well as yard work more comfortably.    Baseline  4/10 back pain at most for the past 3 months (05/12/2019); Pt stated his back pain is not bad, at times his pain will maybe be a 2/10    Time  4    Period  Weeks    Status  Achieved    Target Date  07/10/19      PT LONG TERM GOAL #4   Title  Pt will improve his shoulder FOTO by at least 10 points as a demonstration of improved function.    Baseline  shoulder FOTO: 50 (05/12/2019); shoulder FOTO 58 06/12/2019    Time  4    Period  Weeks    Status  On-going    Target Date  07/10/19      PT LONG TERM GOAL #5   Title  Pt will improve R shoulder ER and IR strength by at least 1/2 MMT grade to promote ability to raise his R arm with less pain.    Baseline  R shoulder ER and IR 4/5 (05/12/2019); Patient reported pain with ER and IR testing of RUE, ER 3+/5, IR 4/5    Time  4    Period  Weeks    Status  New    Target Date  07/10/19            Plan - 06/26/19 1240    Clinical Impression Statement  Improved comfort with ER movements and exercises with addition of inferior and lateral distraction of R humeral head. Continued working in improving glenohumeral joint mobility and rotator cuff and scapular strengthening to promote proper shoulder mechanics. Difficulty with proper scapular movements with arm motions. Pt tolerated session well without aggravation of symptoms. Pt will benefit from continued skilled physical therapy services to decrease pain, improve ROM and function.    Personal Factors and Comorbidities  Age;Past/Current Experience;Time since onset of injury/illness/exacerbation;Comorbidity 1     Comorbidities  Hx of HTN    Examination-Activity Limitations  Bend;Sit;Transfers;Lift    Stability/Clinical Decision Making  Evolving/Moderate complexity    Rehab Potential  Fair    Clinical Impairments Affecting Rehab Potential  (+) motivated, likes to be active; (-) Chronicity of condition, age, multiple areas of pain    PT Frequency  2x / week    PT Duration  4 weeks    PT Treatment/Interventions  Therapeutic activities;Therapeutic exercise;Neuromuscular re-education;Patient/family education;Manual techniques;Dry needling;Spinal Manipulations;Joint Manipulations;Aquatic Therapy;Electrical Stimulation;Iontophoresis 4mg /ml Dexamethasone;Traction    PT Next Visit Plan  golf swing, updated HEP; pt wants more exercises for home; several exercises performed but pt needs further guidance onf form, try next session to formally administer    PT Home Exercise Plan  Medbridge Access Code [email or web address]; updated    Consulted and Agree with Plan of Care  Patient       Patient will benefit from skilled therapeutic intervention in order to improve the following deficits and impairments:  Pain, Postural dysfunction, Impaired UE functional use, Improper body mechanics, Impaired flexibility, Hypomobility, Decreased strength, Decreased range of motion  Visit Diagnosis: Chronic right shoulder pain  Muscle weakness (generalized)  Chronic pain of right knee     Problem List Patient Active Problem List   Diagnosis Date Noted  . Disorder of bursae of shoulder region 12/11/2018  . Contusion of shoulder region 12/11/2018  .  Right inguinal hernia 03/20/2014  . Benign essential hypertension 05/15/2013  . Ischemic cardiomyopathy 03/14/2013  . Pure hypercholesterolemia 03/14/2013  . Coronary artery disease involving native coronary artery of native heart without angina pectoris 03/07/2013  . Diabetes mellitus (HCC) 03/07/2013  . GERD (gastroesophageal reflux disease) 03/07/2013  . STEMI (ST elevation myocardial  infarction) (HCC) 03/07/2013    Loralyn Freshwater PT, DPT   06/26/2019, 12:48 PM  [facility] PHYSICAL AND SPORTS MEDICINE [address] [place], [address] Phone: [phone]   Fax:  [phone]  Name: Timoty Bourke MRN: [id number] Date of Birth: 29-Dec-1950

## 2019-06-26 NOTE — Patient Instructions (Signed)
Access Code: [email or web address] URL: [email or web address] Date: 06/19/2019 Prepared by: Loralyn Freshwater  Exercises Seated Shoulder Inferior Glide - 1 x daily - 7 x weekly - 1 sets - 10 reps - 5 seconds hold Supine Shoulder Flexion Extension Full Range AROM - 3 x daily - 7 x weekly - 3 sets - 10 reps Seated Shoulder External Rotation - 1 x daily - 7 x weekly - 10 reps - 3 sets Standing Isometric Shoulder External Rotation with Doorway - 1 x daily - 7 x weekly - 10 reps - 3 sets Standing Isometric Shoulder Internal Rotation at Doorway - 1 x daily - 7 x weekly - 10 reps - 3 sets Seated Thoracic Lumbar Extension - 1 x daily - 7 x weekly - 3 sets - 10 reps - 5 seconds hold Prone Scapular Slide with Shoulder Extension - 1 x daily - 4 x weekly - 3 sets - 10 reps Standing Shoulder Row with Anchored Resistance - 1 x daily - 4 x weekly - 3 sets - 10 reps Scapular Retraction with Resistance Advanced - 1 x daily - 4 x weekly - 3 sets - 10 reps Doorway Pec Stretch at 60 Degrees Abduction with Arm Straight - 3 x daily - 7 x weekly - 1 sets - 4 reps - 30 seconds hold Shoulder Internal Rotation with Resistance - 1 x daily - 7 x weekly - 3 sets - 10 reps

## 2019-07-01 ENCOUNTER — Other Ambulatory Visit: Payer: Self-pay

## 2019-07-01 ENCOUNTER — Ambulatory Visit: Payer: 59

## 2019-07-01 DIAGNOSIS — M25562 Pain in left knee: Secondary | ICD-10-CM | POA: Diagnosis not present

## 2019-07-01 DIAGNOSIS — M25552 Pain in left hip: Secondary | ICD-10-CM | POA: Diagnosis not present

## 2019-07-01 DIAGNOSIS — M6281 Muscle weakness (generalized): Secondary | ICD-10-CM | POA: Diagnosis not present

## 2019-07-01 DIAGNOSIS — G8929 Other chronic pain: Secondary | ICD-10-CM | POA: Diagnosis not present

## 2019-07-01 DIAGNOSIS — M25561 Pain in right knee: Secondary | ICD-10-CM | POA: Diagnosis not present

## 2019-07-01 DIAGNOSIS — M25511 Pain in right shoulder: Secondary | ICD-10-CM | POA: Diagnosis not present

## 2019-07-01 DIAGNOSIS — M545 Low back pain: Secondary | ICD-10-CM | POA: Diagnosis not present

## 2019-07-01 DIAGNOSIS — M79605 Pain in left leg: Secondary | ICD-10-CM | POA: Diagnosis not present

## 2019-07-01 NOTE — Therapy (Unsigned)
[facility] AND SPORTS MEDICINE [address] [place], [address] Phone: [phone]   Fax:  [phone]  Physical Therapy Treatment  Patient Details  Name: Gabriel Butler MRN: [id number] Date of Birth: October 11, 1950 Referring Provider (PT): Kurtis Bushman, MD   Encounter Date: 07/03/2019  PT End of Session - 07/01/19 0948    Visit Number  --    Number of Visits  --    Date for PT Re-Evaluation  --    Authorization Type  --    Authorization Time Period  --    PT Start Time  --    Activity Tolerance  --    Behavior During Therapy  --       Past Medical History:  Diagnosis Date  . GERD (gastroesophageal reflux disease)   . Heart attack ([place]) 03/05/2013   Coronary stent placement.  . Hypertension     Past Surgical History:  Procedure Laterality Date  . COLONOSCOPY  2013  . CORONARY ANGIOPLASTY WITH STENT PLACEMENT    . HERNIA REPAIR Right 03/26/14   Direct and indirect hernia noted, large ultra Pro mesh utilized.    There were no vitals filed for this visit.  Subjective Assessment - 07/01/19 0950    Subjective  --    Pertinent History  R knee and R shoulder pain. Pt had an injection in R knee (cortisone) about a month ago which has helped substantially. R knee pain started about 6 months ago. R shoulder has been bothering him for tha past year. Raising his arm up into flexion causes R anterior shoulder. Pt also states having low back pain. Aggravating factors include putting much in his yard. Has a hx of arthritis. 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 His general practitioner suggested that he might have a frozen shoulder.  Pt is R hand dominand.  Pt states that he has not been keeping up with his stretches.  Back pain has been off and on for several years. Had chiropractic treatment which helped.  Has not had surgeries for his R  shoulder, R knee and low back. Denies LE paresthesia, or loss of bowel or bladder control.    Limitations  Sitting;Lifting;House hold activities;Standing;Walking    How long can you sit comfortably?  26min    How long can you stand comfortably?  38min    How long can you walk comfortably?  21mins    Diagnostic tests  None    Patient Stated Goals  Wants to be able to play golf, make his pain more comfortable. Be able to keep up with grandchildren.    Currently in Pain?  --    Pain Score  --    Pain Onset  More than a month ago                                 Page opened in error  PT Short Term Goals - 06/12/19 0937      PT SHORT TERM GOAL #1   Title  Patient will be independent with his HEP to decrease R shoulder, R knee and low back pain as well as to decrease difficulty reaching, standing up from prolonged sitting, and performing yard work.    Baseline  Pt has started his HEP (05/12/2019); 5/6 pt reported that he needs to be doing them  more consistently. Has questions about what he should be doing moving forward.    Time  4    Period  Weeks    Status  On-going    Target Date  07/10/19        PT Long Term Goals - 06/12/19 0938      PT LONG TERM GOAL #1   Title  Patient will have a decrease in R shoulder pain to 3/10 or less at worst to promote ability to reach, perform yard work, play golf more comfortably.    Baseline  7/10 R shoulder pain at most for the past 3 months (05/12/2019); 06/12/2019 Pt reported worse pain is a 5-6/10 especially with a sudden movement.    Time  4    Period  Weeks    Status  On-going    Target Date  07/10/19      PT LONG TERM GOAL #2   Title  Pt will have a decrease in R knee pain to 2/10 or less at worst to promote ability to stand up after prolonged sitting, as well as perform yardwork more comfortably.    Baseline  5/10 R knee pain at most for the past 3 months (05/12/2019); Patient reported his knee pain is okay especially since  the injection.    Time  4    Period  Weeks    Status  Achieved    Target Date  07/10/19      PT LONG TERM GOAL #3   Title  Patient will have a decrease in low back pain to 1/10 or less at most to promote ability to perform standing tasks as well as yard work more comfortably.    Baseline  4/10 back pain at most for the past 3 months (05/12/2019); Pt stated his back pain is not bad, at times his pain will maybe be a 2/10    Time  4    Period  Weeks    Status  Achieved    Target Date  07/10/19      PT LONG TERM GOAL #4   Title  Pt will improve his shoulder FOTO by at least 10 points as a demonstration of improved function.    Baseline  shoulder FOTO: 50 (05/12/2019); shoulder FOTO 58 06/12/2019    Time  4    Period  Weeks    Status  On-going    Target Date  07/10/19      PT LONG TERM GOAL #5   Title  Pt will improve R shoulder ER and IR strength by at least 1/2 MMT grade to promote ability to raise his R arm with less pain.    Baseline  R shoulder ER and IR 4/5 (05/12/2019); Patient reported pain with ER and IR testing of RUE, ER 3+/5, IR 4/5    Time  4    Period  Weeks    Status  New    Target Date  07/10/19              Patient will benefit from skilled therapeutic intervention in order to improve the following deficits and impairments:     Visit Diagnosis: No diagnosis found.     Problem List Patient Active Problem List   Diagnosis Date Noted  . Disorder of bursae of shoulder region 12/11/2018  . Contusion of shoulder region 12/11/2018  . Right inguinal hernia 03/20/2014  . Benign essential hypertension 05/15/2013  . Ischemic cardiomyopathy 03/14/2013  . Pure hypercholesterolemia 03/14/2013  .  Coronary artery disease involving native coronary artery of native heart without angina pectoris 03/07/2013  . Diabetes mellitus (HCC) 03/07/2013  . GERD (gastroesophageal reflux disease) 03/07/2013  . STEMI (ST elevation myocardial infarction) (HCC) 03/07/2013     [place] 07/01/2019, 12:38 PM  [facility] [address] [place], [address] Phone: [phone]   Fax:  [phone]  Name: Gabriel Butler MRN: [id number] Date of Birth: 05/31/1950

## 2019-07-01 NOTE — Therapy (Signed)
[facility] PHYSICAL AND SPORTS MEDICINE [address] [place], [address] Phone: [phone]   Fax:  [phone]  Physical Therapy Treatment  Patient Details  Name: Gabriel Butler MRN: [id number] Date of Birth: January 08, 1951 Referring Provider (PT): Cassell Smiles, MD   Encounter Date: 07/01/2019  PT End of Session - 07/01/19 1035    Visit Number  15    Number of Visits  17    Date for PT Re-Evaluation  07/10/19    Authorization Type  5    Authorization Time Period  of 10 progress report    PT Start Time  0948    PT Stop Time  1035    PT Time Calculation (min)  47 min    Activity Tolerance  Patient tolerated treatment well    Behavior During Therapy  [facility] for tasks assessed/performed       Past Medical History:  Diagnosis Date  . GERD (gastroesophageal reflux disease)   . Heart attack (HCC) 03/05/2013   Coronary stent placement.  . Hypertension     Past Surgical History:  Procedure Laterality Date  . COLONOSCOPY  2013  . CORONARY ANGIOPLASTY WITH STENT PLACEMENT    . HERNIA REPAIR Right 03/26/14   Direct and indirect hernia noted, large ultra Pro mesh utilized.    There were no vitals filed for this visit.  Subjective Assessment - 07/01/19 1031    Subjective  R shoulder is ok, no pain currently. 3-4/10 R shoulder pain at most for the past 7 days. Back is not bad, has been trying to stretch it every day, 2-3/10 back pain at most for the past 7 days.  Wants to review his HEP next visit to continue at home.    Pertinent History  R knee and R shoulder pain. Pt had an injection in R knee (cortisone) about a month ago which has helped substantially. R knee pain started about 6 months ago. R shoulder has been bothering him for tha past year. Raising his arm up into flexion causes R anterior shoulder. Pt also states having low back pain. Aggravating factors include putting much in his yard. Has a hx of arthritis. 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 His general practitioner suggested that he might have a frozen shoulder.  Pt is R hand dominand.  Pt states that he has not been keeping up with his stretches.  Back pain has been off and on for several years. Had chiropractic treatment which helped.  Has not had surgeries for his R shoulder, R knee and low back. Denies LE paresthesia, or loss of bowel or bladder control.    Limitations  Sitting;Lifting;House hold activities;Standing;Walking    How long can you sit comfortably?     How long can you stand comfortably?     How long can you walk comfortably?     Diagnostic tests  None    Patient Stated Goals  Wants to be able to play golf, make his pain more comfortable. Be able to keep up with grandchildren.    Currently in Pain?  No/denies    Pain Onset  More than a month ago                                PT Education - 07/01/19 1145    Education provided  Yes    Education Details  ther-ex    Person(s) Educated  Patient    Methods  Explanation;Demonstration;Tactile cues;Verbal cues    Comprehension  Returned demonstration;Verbalized understanding      Objective   no blood pressure problems and no latex band allergies per pt  Pt adds also feeling muscle cramps B lateral thigh as well as medial thigh  MedbridgeAccess Code [email or web address]   Manual therapy  Supine lateral and inferior glide R shoulder grade 3 to 3+ to decrease crepitus with shoulder ER   Supine STM R teres major muscle   TherapeuticExercises:  Supine R shoulder AAROM  Flexion 10x2  scaption 10x2  Abduction 10x2   In scapular plane    ER 10x2   IR 10x2   Supine R shoulder AROM with 2 lbs   Flexion 10x  scaption 1x. Anterior shoulder discomfort. Eases with rest.    Then scaption without weight 10x2   Supine manual R cross arm stretch with PT to  promote posterior capsule mobility.    standing cross arm stretch with double towel roll under arm pit to promote lateral distraction R shoulder to decrease stiffness.   Give as part of HEP next visit.  Review HEP and prepare for graduating to home program next visit per pt input.      Improved exercise technique, movement at target joints, use of target muscles aftermin tomod verbal, visual, tactile cues.    Pt response/clinical impression: Continued working on improving lateral joint mobility and decreasing soft tissue restrictions around R shoulder to promote ROM with decreased anterior impingement and crepitus. Continued with ROM exercises to decrease stiffness. Pt will benefit from continued skilled physical therapy services to decrease pain, improve joint mobility, R shoulder AROM, strength and function.     PT Short Term Goals - 06/12/19 0865      PT SHORT TERM GOAL #1   Title  Patient will be independent with his HEP to decrease R shoulder, R knee and low back pain as well as to decrease difficulty reaching, standing up from prolonged sitting, and performing yard work.    Baseline  Pt has started his HEP (05/12/2019); 5/6 pt reported that he needs to be doing them more consistently. Has questions about what he should be doing moving forward.    Time  4    Period  Weeks    Status  On-going    Target Date  07/10/19        PT Long Term Goals - 06/12/19 0938      PT LONG TERM GOAL #1   Title  Patient will have a decrease in R shoulder pain to 3/10 or less at worst to promote ability to reach, perform yard work, play golf more comfortably.    Baseline  7/10 R shoulder pain at most for the past 3 months (05/12/2019); 06/12/2019 Pt reported worse pain is a 5-6/10 especially with a sudden movement.    Time  4    Period  Weeks    Status  On-going    Target Date  07/10/19      PT LONG TERM GOAL #2   Title  Pt will have a decrease in R knee pain to 2/10 or less at worst to  promote ability to stand up after prolonged sitting, as well as perform yardwork more comfortably.    Baseline  5/10 R knee pain at most for the past 3 months (05/12/2019); Patient reported his knee pain is okay especially  since the injection.    Time  4    Period  Weeks    Status  Achieved    Target Date  07/10/19      PT LONG TERM GOAL #3   Title  Patient will have a decrease in low back pain to 1/10 or less at most to promote ability to perform standing tasks as well as yard work more comfortably.    Baseline  4/10 back pain at most for the past 3 months (05/12/2019); Pt stated his back pain is not bad, at times his pain will maybe be a 2/10    Time  4    Period  Weeks    Status  Achieved    Target Date  07/10/19      PT LONG TERM GOAL #4   Title  Pt will improve his shoulder FOTO by at least 10 points as a demonstration of improved function.    Baseline  shoulder FOTO: 50 (05/12/2019); shoulder FOTO 58 06/12/2019    Time  4    Period  Weeks    Status  On-going    Target Date  07/10/19      PT LONG TERM GOAL #5   Title  Pt will improve R shoulder ER and IR strength by at least 1/2 MMT grade to promote ability to raise his R arm with less pain.    Baseline  R shoulder ER and IR 4/5 (05/12/2019); Patient reported pain with ER and IR testing of RUE, ER 3+/5, IR 4/5    Time  4    Period  Weeks    Status  New    Target Date  07/10/19            Plan - 07/01/19 1145    Clinical Impression Statement  Continued working on improving lateral joint mobility and decreasing soft tissue restrictions around R shoulder to promote ROM with decreased anterior impingement and crepitus. Continued with ROM exercises to decrease stiffness. Pt will benefit from continued skilled physical therapy services to decrease pain, improve joint mobility, R shoulder AROM, strength and function.    Personal Factors and Comorbidities  Age;Past/Current Experience;Time since onset of  injury/illness/exacerbation;Comorbidity 1    Comorbidities  Hx of HTN    Examination-Activity Limitations  Bend;Sit;Transfers;Lift    Stability/Clinical Decision Making  Evolving/Moderate complexity    Rehab Potential  Fair    Clinical Impairments Affecting Rehab Potential  (+) motivated, likes to be active; (-) Chronicity of condition, age, multiple areas of pain    PT Frequency  2x / week    PT Duration  4 weeks    PT Treatment/Interventions  Therapeutic activities;Therapeutic exercise;Neuromuscular re-education;Patient/family education;Manual techniques;Dry needling;Spinal Manipulations;Joint Manipulations;Aquatic Therapy;Electrical Stimulation;Iontophoresis 4mg /ml Dexamethasone;Traction    PT Next Visit Plan  golf swing, updated HEP; pt wants more exercises for home; several exercises performed but pt needs further guidance onf form, try next session to formally administer    PT Home Exercise Plan  Medbridge Access Code [email or web address]; updated    Consulted and Agree with Plan of Care  Patient       Patient will benefit from skilled therapeutic intervention in order to improve the following deficits and impairments:  Pain, Postural dysfunction, Impaired UE functional use, Improper body mechanics, Impaired flexibility, Hypomobility, Decreased strength, Decreased range of motion  Visit Diagnosis: Chronic right shoulder pain  Muscle weakness (generalized)     Problem List Patient Active Problem List   Diagnosis Date Noted  . Disorder  of bursae of shoulder region 12/11/2018  . Contusion of shoulder region 12/11/2018  . Right inguinal hernia 03/20/2014  . Benign essential hypertension 05/15/2013  . Ischemic cardiomyopathy 03/14/2013  . Pure hypercholesterolemia 03/14/2013  . Coronary artery disease involving native coronary artery of native heart without angina pectoris 03/07/2013  . Diabetes mellitus (HCC) 03/07/2013  . GERD (gastroesophageal reflux disease) 03/07/2013  . STEMI (ST  elevation myocardial infarction) (HCC) 03/07/2013    Loralyn Freshwater PT, DPT   07/01/2019, 12:03 PM  [place] [facility] PHYSICAL AND SPORTS MEDICINE [address] [place], [address] Phone: [phone]   Fax:  [phone]  Name: Gabriel Butler MRN: [id number] Date of Birth: [birth year]

## 2019-07-03 ENCOUNTER — Other Ambulatory Visit: Payer: Self-pay

## 2019-07-03 ENCOUNTER — Ambulatory Visit: Payer: 59

## 2019-07-03 DIAGNOSIS — M25552 Pain in left hip: Secondary | ICD-10-CM | POA: Diagnosis not present

## 2019-07-03 DIAGNOSIS — M25561 Pain in right knee: Secondary | ICD-10-CM | POA: Diagnosis not present

## 2019-07-03 DIAGNOSIS — M25511 Pain in right shoulder: Secondary | ICD-10-CM | POA: Diagnosis not present

## 2019-07-03 DIAGNOSIS — G8929 Other chronic pain: Secondary | ICD-10-CM

## 2019-07-03 DIAGNOSIS — M25562 Pain in left knee: Secondary | ICD-10-CM | POA: Diagnosis not present

## 2019-07-03 DIAGNOSIS — M79605 Pain in left leg: Secondary | ICD-10-CM | POA: Diagnosis not present

## 2019-07-03 DIAGNOSIS — M545 Low back pain, unspecified: Secondary | ICD-10-CM

## 2019-07-03 DIAGNOSIS — M6281 Muscle weakness (generalized): Secondary | ICD-10-CM

## 2019-07-03 NOTE — Therapy (Signed)
[place] PHYSICAL AND SPORTS MEDICINE [address], [place], [address] Phone: [phone]   Fax:  [phone]  Physical Therapy Treatment And Discharge Summary  Patient Details  Name: Gabriel Butler MRN: [id number] Date of Birth: 1950/04/22 Referring Provider (PT): Kurtis Bushman, MD   Encounter Date: 07/03/2019  PT End of Session - 07/03/19 0949    Visit Number  16    Number of Visits  17    Date for PT Re-Evaluation  07/10/19    Authorization Type  6    Authorization Time Period  of 10 progress report    PT Start Time  0949    PT Stop Time  1035    PT Time Calculation (min)  46 min    Activity Tolerance  Patient tolerated treatment well    Behavior During Therapy  [facility] for tasks assessed/performed       Past Medical History:  Diagnosis Date  . GERD (gastroesophageal reflux disease)   . Heart attack ([place]) 03/05/2013   Coronary stent placement.  . Hypertension     Past Surgical History:  Procedure Laterality Date  . COLONOSCOPY  2013  . CORONARY ANGIOPLASTY WITH STENT PLACEMENT    . HERNIA REPAIR Right 03/26/14   Direct and indirect hernia noted, large ultra Pro mesh utilized.    There were no vitals filed for this visit.  Subjective Assessment - 07/03/19 0950    Subjective  R shoulder is so far so good. Has not done his HEP since Tuesday due to being out and working. No pain currently. 5/10 at worst for the past 7 days when over exerting himself, otherwise, its 2-3/10.    Pertinent History  R knee and R shoulder pain. Pt had an injection in R knee (cortisone) about a month ago which has helped substantially. R knee pain started about 6 months ago. R shoulder has been bothering him for tha past year. Raising his arm up into flexion causes R anterior shoulder. Pt also states having low back pain. Aggravating factors include putting much in his yard. Has a hx of arthritis. Also has had some sports injuries (broken ankles, R  shoulder sports injury; played football and baseball/ quarterback and pitcher). His general practitioner suggested that he might have a frozen shoulder.  Pt is R hand dominand.  Pt states that he has not been keeping up with his stretches.  Back pain has been off and on for several years. Had chiropractic treatment which helped.  Has not had surgeries for his R shoulder, R knee and low back. Denies LE paresthesia, or loss of bowel or bladder control.    Limitations  Sitting;Lifting;House hold activities;Standing;Walking    How long can you sit comfortably?  74mn    How long can you stand comfortably?  150m    How long can you walk comfortably?  2574m    Diagnostic tests  None    Patient Stated Goals  Wants to be able to play golf, make his pain more comfortable. Be able to keep up with grandchildren.    Currently in Pain?  No/denies    Pain Score  0-No pain    Pain Onset  More than a month ago                                PT Education - 07/03/19 1012    Education provided  Yes  Education Details  ther-ex    Person(s) Educated  Patient    Methods  Explanation;Demonstration;Tactile cues;Verbal cues    Comprehension  Returned demonstration;Verbalized understanding       Objective   no blood pressure problems and no latex band allergies per pt  Pt adds also feeling muscle cramps B lateral thigh as well as medial thigh  MedbridgeAccess Code [email or web address]     TherapeuticExercises:  Reviewed HEP.   Standing R cross arm/posterior capsule stretch with thick towel roll under axilla to promote lateral distraction   Reviewed quality of movemnt versus quality, promote quality.   Seated self inferior capsule mob 10x10 seconds  Doorway pectoralis stretch 30 seconds x 3  Shoulder ER with rod 10x10 seconds  Seated manually resisted R shoulder ER and IR 1x each   ER 4/5, IR 4/5  Seated R shouldre flexion with scapular control  10x     Improved exercise technique, movement at target joints, use of target muscles aftermin tomod verbal, visual, tactile cues.    Pt response/clinical impression: Pt demonstrates overall decreased R shoulder pain and improved R UE function since initial evaluation. Symptoms improved with treatment to promote joint mobility as well as improving glenohumeral mechanics. Skilled physical therapy services discharged with patient continuing progress with his exercises at home secondary to pt input in plan of care and busy work/life schedule.      PT Short Term Goals - 06/12/19 7517      PT SHORT TERM GOAL #1   Title  Patient will be independent with his HEP to decrease R shoulder, R knee and low back pain as well as to decrease difficulty reaching, standing up from prolonged sitting, and performing yard work.    Baseline  Pt has started his HEP (05/12/2019); 5/6 pt reported that he needs to be doing them more consistently. Has questions about what he should be doing moving forward.    Time  4    Period  Weeks    Status  On-going    Target Date  07/10/19        PT Long Term Goals - 07/03/19 1026      PT LONG TERM GOAL #1   Title  Patient will have a decrease in R shoulder pain to 3/10 or less at worst to promote ability to reach, perform yard work, play golf more comfortably.    Baseline  7/10 R shoulder pain at most for the past 3 months (05/12/2019); 06/12/2019 Pt reported worse pain is a 5-6/10 especially with a sudden movement. 5/10 at worst for the past 7 days (07/03/2019)    Time  4    Period  Weeks    Status  Partially Met    Target Date  07/03/19      PT LONG TERM GOAL #2   Title  Pt will have a decrease in R knee pain to 2/10 or less at worst to promote ability to stand up after prolonged sitting, as well as perform yardwork more comfortably.    Baseline  5/10 R knee pain at most for the past 3 months (05/12/2019); Patient reported his knee pain is okay especially since  the injection.    Time  4    Period  Weeks    Status  Achieved      PT LONG TERM GOAL #3   Title  Patient will have a decrease in low back pain to 1/10 or less at most to promote ability to perform standing tasks  as well as yard work more comfortably.    Baseline  4/10 back pain at most for the past 3 months (05/12/2019); Pt stated his back pain is not bad, at times his pain will maybe be a 2/10    Time  4    Period  Weeks    Status  Achieved      PT LONG TERM GOAL #4   Title  Pt will improve his shoulder FOTO by at least 10 points as a demonstration of improved function.    Baseline  shoulder FOTO: 50 (05/12/2019); shoulder FOTO 58 06/12/2019; 59 (07/03/2019)    Time  4    Period  Weeks    Status  Partially Met    Target Date  07/03/19      PT LONG TERM GOAL #5   Title  Pt will improve R shoulder ER and IR strength by at least 1/2 MMT grade to promote ability to raise his R arm with less pain.    Baseline  R shoulder ER and IR 4/5 (05/12/2019); Patient reported pain with ER and IR testing of RUE, ER 3+/5, IR 4/5;ER 4/5, IR 4/5 (07/03/2019)    Time  4    Period  Weeks    Status  On-going    Target Date  07/03/19            Plan - 07/03/19 1014    Clinical Impression Statement  Pt demonstrates overall decreased R shoulder pain and improved R UE function since initial evaluation. Symptoms improved with treatment to promote joint mobility as well as improving glenohumeral mechanics. Skilled physical therapy services discharged with patient continuing progress with his exercises at home secondary to pt input in plan of care and busy work/life schedule.    Personal Factors and Comorbidities  Age;Past/Current Experience;Time since onset of injury/illness/exacerbation;Comorbidity 1    Comorbidities  Hx of HTN    Examination-Activity Limitations  Bend;Sit;Transfers;Lift    Stability/Clinical Decision Making  --    Rehab Potential  --    Clinical Impairments Affecting Rehab Potential  (+)  motivated, likes to be active; (-) Chronicity of condition, age, multiple areas of pain    PT Frequency  --    PT Duration  --    PT Treatment/Interventions  Therapeutic exercise;Neuromuscular re-education;Patient/family education;Manual techniques;Therapeutic activities    PT Next Visit Plan  Continue progress with HEP    PT Home Exercise Plan  Medbridge Access Code [email or web address]; updated    Consulted and Agree with Plan of Care  Patient       Patient will benefit from skilled therapeutic intervention in order to improve the following deficits and impairments:  Pain, Postural dysfunction, Impaired UE functional use, Improper body mechanics, Impaired flexibility, Hypomobility, Decreased strength, Decreased range of motion  Visit Diagnosis: Chronic right shoulder pain  Muscle weakness (generalized)  Chronic pain of right knee  Chronic bilateral low back pain, unspecified whether sciatica present     Problem List Patient Active Problem List   Diagnosis Date Noted  . Disorder of bursae of shoulder region 12/11/2018  . Contusion of shoulder region 12/11/2018  . Right inguinal hernia 03/20/2014  . Benign essential hypertension 05/15/2013  . Ischemic cardiomyopathy 03/14/2013  . Pure hypercholesterolemia 03/14/2013  . Coronary artery disease involving native coronary artery of native heart without angina pectoris 03/07/2013  . Diabetes mellitus ([place]) 03/07/2013  . GERD (gastroesophageal reflux disease) 03/07/2013  . STEMI (ST elevation myocardial infarction) ([place]) 03/07/2013    Thank you  for your referral.  Joneen Boers PT, DPT   07/03/2019, 5:16 PM  [place] PHYSICAL AND SPORTS MEDICINE [address], [place], [address] Phone: [phone]   Fax:  [phone]  Name: Gabriel Butler MRN: [id number] Date of Birth: 02/07/1951

## 2019-07-03 NOTE — Patient Instructions (Signed)
Access Code: [email or web address] URL: [email or web address] Date: 06/19/2019 Prepared by: Loralyn Freshwater  Exercises Standing Shoulder Posterior Capsule Stretch - 3 x daily - 7 x weekly - 2 sets - 10 reps - 10 seconds hold Seated Shoulder Inferior Glide - 3 x daily - 7 x weekly - 2 sets - 10 reps - 10 seconds hold Doorway Pec Stretch at 60 Degrees Abduction with Arm Straight - 3 x daily - 7 x weekly - 1 sets - 4 reps - 30 seconds hold Seated Shoulder External Rotation AAROM with Cane and Hand in Neutral - 3 x daily - 7 x weekly - 2 sets - 10 reps - 10 seconds hold Seated Shoulder Flexion Full Range Single Arm - 3 x daily - 7 x weekly - 1 sets - 10 reps Supine Shoulder Flexion Extension Full Range AROM - 3 x daily - 7 x weekly - 3 sets - 10 reps Seated Shoulder External Rotation - 1 x daily - 7 x weekly - 10 reps - 3 sets Seated Thoracic Lumbar Extension - 1 x daily - 7 x weekly - 3 sets - 10 reps - 5 seconds hold Scapular Retraction with Resistance Advanced - 1 x daily - 4 x weekly - 3 sets - 10 reps Shoulder Internal Rotation with Resistance - 1 x daily - 7 x weekly - 3 sets - 10 reps

## 2019-07-11 ENCOUNTER — Other Ambulatory Visit: Payer: Self-pay

## 2019-07-11 DIAGNOSIS — K219 Gastro-esophageal reflux disease without esophagitis: Secondary | ICD-10-CM

## 2019-07-11 DIAGNOSIS — G4700 Insomnia, unspecified: Secondary | ICD-10-CM

## 2019-07-11 MED ORDER — TRAZODONE HCL 50 MG PO TABS: ORAL_TABLET | ORAL | 3 refills | Status: DC

## 2019-07-11 MED ORDER — PANTOPRAZOLE SODIUM 40 MG PO TBEC: DELAYED_RELEASE_TABLET | ORAL | 2 refills | Status: DC

## 2019-07-15 DIAGNOSIS — M19019 Primary osteoarthritis, unspecified shoulder: Secondary | ICD-10-CM | POA: Diagnosis not present

## 2019-07-15 DIAGNOSIS — M1711 Unilateral primary osteoarthritis, right knee: Secondary | ICD-10-CM | POA: Diagnosis not present

## 2019-07-23 ENCOUNTER — Other Ambulatory Visit: Payer: Self-pay | Admitting: Physician Assistant

## 2019-07-23 DIAGNOSIS — G4700 Insomnia, unspecified: Secondary | ICD-10-CM

## 2019-07-23 MED ORDER — ALPRAZOLAM ER 0.5 MG PO TB24
0.5000 mg | ORAL_TABLET | Freq: Two times a day (BID) | ORAL | 2 refills | Status: DC
Start: 2019-07-23 — End: 2019-10-20

## 2019-08-04 ENCOUNTER — Other Ambulatory Visit: Payer: Self-pay | Admitting: Physician Assistant

## 2019-08-04 DIAGNOSIS — J0141 Acute recurrent pansinusitis: Secondary | ICD-10-CM | POA: Diagnosis not present

## 2019-08-04 DIAGNOSIS — J309 Allergic rhinitis, unspecified: Secondary | ICD-10-CM | POA: Diagnosis not present

## 2019-08-04 DIAGNOSIS — K219 Gastro-esophageal reflux disease without esophagitis: Secondary | ICD-10-CM

## 2019-08-07 ENCOUNTER — Other Ambulatory Visit: Payer: Self-pay | Admitting: Internal Medicine

## 2019-08-07 DIAGNOSIS — E7800 Pure hypercholesterolemia, unspecified: Secondary | ICD-10-CM

## 2019-08-15 NOTE — Progress Notes (Signed)
Scheduled to review lab results 08/27/19 with Dr. Mayford Knife.  AMD

## 2019-08-20 ENCOUNTER — Other Ambulatory Visit: Payer: 59

## 2019-08-20 ENCOUNTER — Other Ambulatory Visit: Payer: Self-pay

## 2019-08-20 VITALS — BP 120/81 | HR 70 | Resp 14 | Wt 204.0 lb

## 2019-08-20 DIAGNOSIS — Z0000 Encounter for general adult medical examination without abnormal findings: Secondary | ICD-10-CM

## 2019-08-21 LAB — BUN+CREAT
BUN/Creatinine Ratio: 15 (ref 10–24)
BUN: 22 mg/dL (ref 8–27)
Creatinine, Ser: 1.46 mg/dL — ABNORMAL HIGH (ref 0.76–1.27)
GFR calc Af Amer: 56 mL/min/{1.73_m2} — ABNORMAL LOW (ref >59)
GFR calc non Af Amer: 49 mL/min/{1.73_m2} — ABNORMAL LOW (ref >59)

## 2019-08-21 LAB — HGB A1C W/O EAG: Hgb A1c MFr Bld: 6.6 % — ABNORMAL HIGH (ref 4.8–5.6)

## 2019-08-25 ENCOUNTER — Other Ambulatory Visit: Payer: Self-pay | Admitting: Dermatology

## 2019-08-25 DIAGNOSIS — R972 Elevated prostate specific antigen [PSA]: Secondary | ICD-10-CM | POA: Diagnosis not present

## 2019-08-25 DIAGNOSIS — N401 Enlarged prostate with lower urinary tract symptoms: Secondary | ICD-10-CM | POA: Diagnosis not present

## 2019-08-26 DIAGNOSIS — N401 Enlarged prostate with lower urinary tract symptoms: Secondary | ICD-10-CM | POA: Diagnosis not present

## 2019-08-26 DIAGNOSIS — Z125 Encounter for screening for malignant neoplasm of prostate: Secondary | ICD-10-CM | POA: Diagnosis not present

## 2019-08-27 ENCOUNTER — Ambulatory Visit: Payer: 59 | Admitting: Emergency Medicine

## 2019-08-27 ENCOUNTER — Other Ambulatory Visit: Payer: Self-pay

## 2019-08-27 ENCOUNTER — Encounter: Payer: Self-pay | Admitting: Emergency Medicine

## 2019-08-27 VITALS — BP 122/76 | HR 68 | Temp 98.3°F | Resp 12 | Wt 203.0 lb

## 2019-08-27 DIAGNOSIS — N1831 Chronic kidney disease, stage 3a: Secondary | ICD-10-CM

## 2019-08-27 NOTE — Progress Notes (Signed)
Occupational Health Provider Note       Time seen: 8:36 AM    I have reviewed the vital signs and the nursing notes.  HISTORY   Chief Complaint No chief complaint on file.    HPI Gabriel Butler is a 69 y.o. male with a history of GERD, MI, hypertension who presents today for recheck regarding recent lab work.  Patient states he is feeling fine and has no complaints  Past Medical History:  Diagnosis Date  . GERD (gastroesophageal reflux disease)   . Heart attack ([place]) 03/05/2013   Coronary stent placement.  . Hypertension     Past Surgical History:  Procedure Laterality Date  . COLONOSCOPY  2013  . CORONARY ANGIOPLASTY WITH STENT PLACEMENT    . HERNIA REPAIR Right 03/26/14   Direct and indirect hernia noted, large ultra Pro mesh utilized.    Allergies Other and Pollen extract   Review of Systems Constitutional: Negative for fever. Cardiovascular: Negative for chest pain. Respiratory: Negative for shortness of breath. Gastrointestinal: Negative for abdominal pain, vomiting and diarrhea. Musculoskeletal: Negative for back pain. Skin: Negative for rash. Neurological: Negative for headaches, focal weakness or numbness.  All systems negative/normal/unremarkable except as stated in the HPI  ____________________________________________   PHYSICAL EXAM:  VITAL SIGNS: Vitals:   08/27/19 0821  BP: 122/76  Pulse: 68  Resp: 12  Temp: 98.3 F (36.8 C)  SpO2: 97%    Constitutional: Alert and oriented. Well appearing and in no distress. Eyes: Conjunctivae are normal. Normal extraocular movements. Cardiovascular: Normal rate, regular rhythm. No murmurs, rubs, or gallops. Respiratory: Normal respiratory effort without tachypnea nor retractions. Breath sounds are clear and equal bilaterally. No wheezes/rales/rhonchi. Gastrointestinal: Soft and nontender. Normal bowel sounds Musculoskeletal: Nontender with normal range of motion in extremities. No lower  extremity tenderness nor edema. Neurologic:  Normal speech and language. No gross focal neurologic deficits are appreciated.  Skin:  Skin is warm, dry and intact. No rash noted. Psychiatric: Speech and behavior are normal.   ____________________________________________   LABS (pertinent positives/negatives)  Recent Results (from the past 2160 hour(s))  Hgb A1c w/o eAG     Status: Abnormal   Collection Time: 08/20/19  8:19 AM  Result Value Ref Range   Hgb A1c MFr Bld 6.6 (H) 4.8 - 5.6 %    Comment:          Prediabetes: 5.7 - 6.4          Diabetes: >6.4          Glycemic control for adults with diabetes: <7.0   BUN+Creat     Status: Abnormal   Collection Time: 08/20/19  8:19 AM  Result Value Ref Range   BUN 22 8 - 27 mg/dL   Creatinine, Ser 1.46 (H) 0.76 - 1.27 mg/dL   GFR calc non Af Amer 49 (L) >59 mL/min/1.73   GFR calc Af Amer 56 (L) >59 mL/min/1.73    Comment: **[facility] currently reports eGFR in compliance with the current**   recommendations of the [employer]. [facility] will   update reporting as new guidelines are published from the NKF-ASN   Task force.    BUN/Creatinine Ratio 15 10 - 24     DIFFERENTIAL DIAGNOSIS  Diabetes, medical screening exam, chronic kidney disease  ASSESSMENT AND PLAN  Chronic kidney disease   Plan: The patient had presented for reevaluation concerning screening lab work. Patient's labs did reveal an elevation in his creatinine to 1.46 with a GFR of 49  placing him in stage III chronic kidney disease.  We have given him diet information regarding chronic kidney disease and will refer to nephrology for outpatient follow-up.  Lenise Arena MD    Note: This note was generated in part or whole with voice recognition software. Voice recognition is usually quite accurate but there are transcription errors that can and very often do occur. I apologize for any typographical errors that were not detected and corrected.

## 2019-08-27 NOTE — Addendum Note (Signed)
Addended by: Gardner Candle on: 08/27/2019 09:51 AM   Modules accepted: Orders

## 2019-09-02 DIAGNOSIS — M1711 Unilateral primary osteoarthritis, right knee: Secondary | ICD-10-CM | POA: Diagnosis not present

## 2019-09-02 DIAGNOSIS — M19011 Primary osteoarthritis, right shoulder: Secondary | ICD-10-CM | POA: Diagnosis not present

## 2019-09-03 ENCOUNTER — Other Ambulatory Visit: Payer: Self-pay

## 2019-09-03 DIAGNOSIS — J302 Other seasonal allergic rhinitis: Secondary | ICD-10-CM

## 2019-09-03 DIAGNOSIS — I10 Essential (primary) hypertension: Secondary | ICD-10-CM

## 2019-09-03 MED ORDER — SPIRONOLACTONE 25 MG PO TABS: 12.5000 mg | ORAL_TABLET | Freq: Every day | ORAL | 3 refills | Status: DC

## 2019-09-03 MED ORDER — CETIRIZINE HCL 10 MG PO TABS: 10.0000 mg | ORAL_TABLET | Freq: Every day | ORAL | 3 refills | Status: DC

## 2019-09-03 MED ORDER — LISINOPRIL 10 MG PO TABS: 10.0000 mg | ORAL_TABLET | Freq: Every day | ORAL | 3 refills | Status: DC

## 2019-09-22 ENCOUNTER — Other Ambulatory Visit: Payer: Self-pay | Admitting: Physician Assistant

## 2019-10-16 ENCOUNTER — Other Ambulatory Visit: Payer: Self-pay | Admitting: Nephrology

## 2019-10-16 DIAGNOSIS — I10 Essential (primary) hypertension: Secondary | ICD-10-CM | POA: Diagnosis not present

## 2019-10-16 DIAGNOSIS — E1122 Type 2 diabetes mellitus with diabetic chronic kidney disease: Secondary | ICD-10-CM | POA: Diagnosis not present

## 2019-10-16 DIAGNOSIS — N1832 Chronic kidney disease, stage 3b: Secondary | ICD-10-CM | POA: Diagnosis not present

## 2019-10-20 ENCOUNTER — Other Ambulatory Visit: Payer: Self-pay

## 2019-10-20 DIAGNOSIS — K219 Gastro-esophageal reflux disease without esophagitis: Secondary | ICD-10-CM

## 2019-10-20 DIAGNOSIS — G4700 Insomnia, unspecified: Secondary | ICD-10-CM

## 2019-10-20 DIAGNOSIS — F419 Anxiety disorder, unspecified: Secondary | ICD-10-CM

## 2019-10-20 MED ORDER — PANTOPRAZOLE SODIUM 40 MG PO TBEC: DELAYED_RELEASE_TABLET | ORAL | 2 refills | Status: AC

## 2019-10-20 MED ORDER — ALPRAZOLAM ER 0.5 MG PO TB24: 0.5000 mg | ORAL_TABLET | Freq: Two times a day (BID) | ORAL | 2 refills | Status: DC

## 2019-10-20 MED ORDER — TRAZODONE HCL 50 MG PO TABS: ORAL_TABLET | ORAL | 2 refills | Status: DC

## 2019-10-22 ENCOUNTER — Other Ambulatory Visit: Payer: Self-pay

## 2019-10-22 ENCOUNTER — Ambulatory Visit
Admission: RE | Admit: 2019-10-22 | Discharge: 2019-10-22 | Disposition: A | Discharge status: Home / Self Care | Payer: 59 | Source: Ambulatory Visit | Attending: Nephrology | Admitting: Nephrology

## 2019-10-22 DIAGNOSIS — I10 Essential (primary) hypertension: Secondary | ICD-10-CM | POA: Diagnosis not present

## 2019-10-22 DIAGNOSIS — E1122 Type 2 diabetes mellitus with diabetic chronic kidney disease: Secondary | ICD-10-CM | POA: Diagnosis not present

## 2019-11-03 ENCOUNTER — Other Ambulatory Visit: Payer: Self-pay | Admitting: Physician Assistant

## 2019-11-03 DIAGNOSIS — F419 Anxiety disorder, unspecified: Secondary | ICD-10-CM

## 2019-11-20 DIAGNOSIS — N182 Chronic kidney disease, stage 2 (mild): Secondary | ICD-10-CM | POA: Diagnosis not present

## 2019-11-20 DIAGNOSIS — I10 Essential (primary) hypertension: Secondary | ICD-10-CM | POA: Diagnosis not present

## 2019-11-20 DIAGNOSIS — E1122 Type 2 diabetes mellitus with diabetic chronic kidney disease: Secondary | ICD-10-CM | POA: Diagnosis not present

## 2019-11-26 DIAGNOSIS — N189 Chronic kidney disease, unspecified: Secondary | ICD-10-CM | POA: Insufficient documentation

## 2019-12-23 ENCOUNTER — Telehealth: Payer: 59 | Admitting: Nurse Practitioner

## 2019-12-23 ENCOUNTER — Other Ambulatory Visit: Payer: Self-pay | Admitting: Physician Assistant

## 2019-12-23 DIAGNOSIS — M19011 Primary osteoarthritis, right shoulder: Secondary | ICD-10-CM | POA: Diagnosis not present

## 2019-12-23 DIAGNOSIS — J320 Chronic maxillary sinusitis: Secondary | ICD-10-CM

## 2019-12-23 DIAGNOSIS — F419 Anxiety disorder, unspecified: Secondary | ICD-10-CM

## 2019-12-23 MED ORDER — CEFDINIR 300 MG PO CAPS: 300.0000 mg | ORAL_CAPSULE | Freq: Two times a day (BID) | ORAL | 0 refills | Status: AC

## 2019-12-23 MED ORDER — FLUTICASONE PROPIONATE 50 MCG/ACT NA SUSP: 2.0000 | Freq: Every day | NASAL | 6 refills | Status: DC

## 2019-12-23 NOTE — Progress Notes (Signed)
   Subjective:    Patient ID: Gabriel Butler, male    DOB: [birth year], 69 y.o.   MRN: [id number]  HPI 69 y/o chronic sinus congestion, 2-3 times a year he gets worsening symptoms requiring antibiotics.   Now sick for 2 weeks with sinus/ HA using flonase regularly for allergy and sinus control.   States right side maxillary is always worse.   GERD symptoms currently well controled  Last abx Cefdinir 4-6 months ago from Dr McQueen/ ENT. Denies and SEs from Cefdinir- no GI upset.   Past Medical History:  Diagnosis Date  . GERD (gastroesophageal reflux disease)   . Heart attack (HCC) 03/05/2013   Coronary stent placement.  . Hypertension   . Sessile polyp      Review of Systems  Constitutional: Negative.   HENT: Positive for congestion, postnasal drip, rhinorrhea and sinus pressure.   Eyes: Negative.   Respiratory: Negative.   Cardiovascular: Negative.   Gastrointestinal: Negative.   Neurological: Negative.    .    Objective:   Physical Exam This was a telehealth appointment, during COVID-19 pandemic when policy recommends any patient's with respiratory symptoms and are medically stable have telehealth appointment.   Spoke with patient on the phone, was not in any acute distress, able to hold conversation without SOB, good historian.        Assessment & Plan:  Discussed with patient that he may double flonase for 48 hours, use Coricidin OTC for congestion relief but should avoid other over the counter decongestants due to cardiac history.   Will start antibiotics given history and current symptoms. Patient advised to RTC if symptoms persist/worsen or with new concerns or symptoms for example any chest congestion or onset of cough.   Meds ordered this encounter  Medications  . cefdinir (OMNICEF) 300 MG capsule    Sig: Take 1 capsule (300 mg total) by mouth 2 (two) times daily for 10 days.    Dispense:  20 capsule    Refill:  0  . fluticasone (FLONASE) 50 MCG/ACT  nasal spray    Sig: Place 2 sprays into both nostrils daily.    Dispense:  15.8 mL    Refill:  6

## 2020-01-06 ENCOUNTER — Other Ambulatory Visit: Payer: Self-pay

## 2020-01-06 DIAGNOSIS — Z9109 Other allergy status, other than to drugs and biological substances: Secondary | ICD-10-CM

## 2020-01-06 MED ORDER — MONTELUKAST SODIUM 10 MG PO TABS: 10.0000 mg | ORAL_TABLET | Freq: Every day | ORAL | 2 refills | Status: DC

## 2020-01-30 ENCOUNTER — Other Ambulatory Visit: Payer: Self-pay | Admitting: Emergency Medicine

## 2020-01-30 DIAGNOSIS — E7800 Pure hypercholesterolemia, unspecified: Secondary | ICD-10-CM

## 2020-02-03 ENCOUNTER — Other Ambulatory Visit: Payer: Self-pay

## 2020-02-03 NOTE — Telephone Encounter (Signed)
Rx refill request already in Epic - re-routed to Ron Smith, PA-C.  AMD 

## 2020-02-03 NOTE — Telephone Encounter (Signed)
Must see the new clinic doctor before next refill

## 2020-02-04 ENCOUNTER — Other Ambulatory Visit: Payer: 59

## 2020-02-04 ENCOUNTER — Other Ambulatory Visit: Payer: Self-pay | Admitting: Physician Assistant

## 2020-02-04 DIAGNOSIS — Z1152 Encounter for screening for COVID-19: Secondary | ICD-10-CM

## 2020-02-04 MED ORDER — FEXOFENADINE-PSEUDOEPHED ER 60-120 MG PO TB12: 1.0000 | ORAL_TABLET | Freq: Two times a day (BID) | ORAL | 0 refills | Status: DC

## 2020-02-04 MED ORDER — AMOXICILLIN-POT CLAVULANATE 875-125 MG PO TABS: 1.0000 | ORAL_TABLET | Freq: Two times a day (BID) | ORAL | 0 refills | Status: DC

## 2020-02-04 NOTE — Progress Notes (Signed)
Presents to [facility] for outdoor specimen collection for covid test.  Symptomatic Sinus congestion, sinus pressure forehead & cheeks, Sinus drainage  No know exposure to anyone who has tested positive for covid.  Vaccinated  Has Mychart  AMD

## 2020-02-06 LAB — NOVEL CORONAVIRUS, NAA: SARS-CoV-2, NAA: NOT DETECTED

## 2020-02-06 LAB — SARS-COV-2, NAA 2 DAY TAT

## 2020-02-18 ENCOUNTER — Other Ambulatory Visit: Payer: Self-pay | Admitting: Physician Assistant

## 2020-02-19 DIAGNOSIS — G8929 Other chronic pain: Secondary | ICD-10-CM | POA: Diagnosis not present

## 2020-02-19 DIAGNOSIS — M8949 Other hypertrophic osteoarthropathy, multiple sites: Secondary | ICD-10-CM | POA: Diagnosis not present

## 2020-02-19 DIAGNOSIS — M25511 Pain in right shoulder: Secondary | ICD-10-CM | POA: Diagnosis not present

## 2020-03-02 ENCOUNTER — Other Ambulatory Visit: Payer: Self-pay | Admitting: Physician Assistant

## 2020-03-02 DIAGNOSIS — F419 Anxiety disorder, unspecified: Secondary | ICD-10-CM

## 2020-03-05 ENCOUNTER — Other Ambulatory Visit: Payer: Self-pay

## 2020-03-08 ENCOUNTER — Telehealth: Payer: Self-pay

## 2020-03-08 NOTE — Telephone Encounter (Signed)
Rx refill request already in Epic - rerouted to Dr. William Holder.  AMD 

## 2020-03-09 ENCOUNTER — Other Ambulatory Visit: Payer: Self-pay | Admitting: Adult Medicine

## 2020-03-09 DIAGNOSIS — F419 Anxiety disorder, unspecified: Secondary | ICD-10-CM

## 2020-03-09 MED ORDER — SERTRALINE HCL 50 MG PO TABS: 150.0000 mg | ORAL_TABLET | Freq: Every day | ORAL | 1 refills | Status: DC

## 2020-03-09 MED ORDER — ALPRAZOLAM 0.5 MG PO TABS: 0.5000 mg | ORAL_TABLET | Freq: Two times a day (BID) | ORAL | 0 refills | Status: DC | PRN

## 2020-03-11 ENCOUNTER — Other Ambulatory Visit: Payer: Self-pay

## 2020-03-11 ENCOUNTER — Other Ambulatory Visit: Payer: Self-pay | Admitting: Physician Assistant

## 2020-03-11 DIAGNOSIS — F419 Anxiety disorder, unspecified: Secondary | ICD-10-CM

## 2020-03-11 MED ORDER — ALPRAZOLAM 0.5 MG PO TABS: 0.5000 mg | ORAL_TABLET | Freq: Two times a day (BID) | ORAL | 0 refills | Status: DC | PRN

## 2020-03-15 ENCOUNTER — Other Ambulatory Visit: Payer: Self-pay | Admitting: Dermatology

## 2020-03-19 ENCOUNTER — Ambulatory Visit: Payer: Self-pay

## 2020-03-19 ENCOUNTER — Other Ambulatory Visit: Payer: Self-pay

## 2020-03-19 DIAGNOSIS — Z0000 Encounter for general adult medical examination without abnormal findings: Secondary | ICD-10-CM

## 2020-03-19 LAB — POCT URINALYSIS DIPSTICK
Bilirubin, UA: NEGATIVE
Blood, UA: NEGATIVE
Glucose, UA: POSITIVE — AB
Ketones, UA: NEGATIVE
Leukocytes, UA: NEGATIVE
Nitrite, UA: NEGATIVE
Protein, UA: NEGATIVE
Spec Grav, UA: 1.015 (ref 1.010–1.025)
Urobilinogen, UA: 0.2 E.U./dL
pH, UA: 6 (ref 5.0–8.0)

## 2020-03-19 NOTE — Progress Notes (Signed)
Pt scheduled to complete physical 03/24/20. Pt needs EKG.

## 2020-03-20 LAB — CMP12+LP+TP+TSH+6AC+PSA+CBC…
ALT: 36 IU/L (ref 0–44)
AST: 28 IU/L (ref 0–40)
Albumin/Globulin Ratio: 2 (ref 1.2–2.2)
Albumin: 4.6 g/dL (ref 3.8–4.8)
Alkaline Phosphatase: 82 IU/L (ref 44–121)
BUN/Creatinine Ratio: 17 (ref 10–24)
BUN: 19 mg/dL (ref 8–27)
Basophils Absolute: 0 10*3/uL (ref 0.0–0.2)
Basos: 1 %
Bilirubin Total: 0.5 mg/dL (ref 0.0–1.2)
Calcium: 9.6 mg/dL (ref 8.6–10.2)
Chloride: 100 mmol/L (ref 96–106)
Chol/HDL Ratio: 2.2 ratio (ref 0.0–5.0)
Cholesterol, Total: 109 mg/dL (ref 100–199)
Creatinine, Ser: 1.14 mg/dL (ref 0.76–1.27)
EOS (ABSOLUTE): 0.1 10*3/uL (ref 0.0–0.4)
Eos: 1 %
Estimated CHD Risk: <0.5 times avg. (ref 0.0–1.0)
Free Thyroxine Index: 1.2 (ref 1.2–4.9)
GFR calc Af Amer: 76 mL/min/{1.73_m2} (ref >59)
GFR calc non Af Amer: 66 mL/min/{1.73_m2} (ref >59)
GGT: 26 IU/L (ref 0–65)
Globulin, Total: 2.3 g/dL (ref 1.5–4.5)
Glucose: 109 mg/dL — ABNORMAL HIGH (ref 65–99)
HDL: 49 mg/dL (ref >39)
Hematocrit: 51.5 % — ABNORMAL HIGH (ref 37.5–51.0)
Hemoglobin: 17.4 g/dL (ref 13.0–17.7)
Immature Grans (Abs): 0 10*3/uL (ref 0.0–0.1)
Immature Granulocytes: 0 %
Iron: 105 ug/dL (ref 38–169)
LDH: 219 IU/L (ref 121–224)
LDL Chol Calc (NIH): 46 mg/dL (ref 0–99)
Lymphocytes Absolute: 1.4 10*3/uL (ref 0.7–3.1)
Lymphs: 22 %
MCH: 31.2 pg (ref 26.6–33.0)
MCHC: 33.8 g/dL (ref 31.5–35.7)
MCV: 92 fL (ref 79–97)
Monocytes Absolute: 0.5 10*3/uL (ref 0.1–0.9)
Monocytes: 7 %
Neutrophils Absolute: 4.4 10*3/uL (ref 1.4–7.0)
Neutrophils: 69 %
Phosphorus: 4 mg/dL (ref 2.8–4.1)
Platelets: 236 10*3/uL (ref 150–450)
Potassium: 4.6 mmol/L (ref 3.5–5.2)
Prostate Specific Ag, Serum: 2.9 ng/mL (ref 0.0–4.0)
RBC: 5.58 x10E6/uL (ref 4.14–5.80)
RDW: 12.7 % (ref 11.6–15.4)
Sodium: 137 mmol/L (ref 134–144)
T3 Uptake Ratio: 22 % — ABNORMAL LOW (ref 24–39)
T4, Total: 5.6 ug/dL (ref 4.5–12.0)
TSH: 1.24 u[IU]/mL (ref 0.450–4.500)
Total Protein: 6.9 g/dL (ref 6.0–8.5)
Triglycerides: 64 mg/dL (ref 0–149)
Uric Acid: 4.9 mg/dL (ref 3.8–8.4)
VLDL Cholesterol Cal: 14 mg/dL (ref 5–40)
WBC: 6.3 10*3/uL (ref 3.4–10.8)

## 2020-03-20 LAB — HGB A1C W/O EAG: Hgb A1c MFr Bld: 6.5 % — ABNORMAL HIGH (ref 4.8–5.6)

## 2020-03-20 LAB — MICROALBUMIN / CREATININE URINE RATIO
Creatinine, Urine: 69.4 mg/dL
Microalb/Creat Ratio: 9 mg/g creat (ref 0–29)
Microalbumin, Urine: 6 ug/mL

## 2020-03-24 ENCOUNTER — Ambulatory Visit: Payer: Self-pay | Admitting: Adult Medicine

## 2020-03-24 ENCOUNTER — Other Ambulatory Visit: Payer: Self-pay

## 2020-03-24 VITALS — BP 119/81 | HR 68 | Temp 97.9°F | Resp 12 | Ht 75.0 in | Wt 205.0 lb

## 2020-03-24 DIAGNOSIS — M545 Low back pain, unspecified: Secondary | ICD-10-CM

## 2020-03-24 DIAGNOSIS — S40011S Contusion of right shoulder, sequela: Secondary | ICD-10-CM

## 2020-03-24 DIAGNOSIS — I10 Essential (primary) hypertension: Secondary | ICD-10-CM

## 2020-03-24 DIAGNOSIS — E7800 Pure hypercholesterolemia, unspecified: Secondary | ICD-10-CM

## 2020-03-24 DIAGNOSIS — Z0000 Encounter for general adult medical examination without abnormal findings: Secondary | ICD-10-CM

## 2020-03-24 DIAGNOSIS — I213 ST elevation (STEMI) myocardial infarction of unspecified site: Secondary | ICD-10-CM

## 2020-03-24 NOTE — Progress Notes (Signed)
HPI Gabriel Butler is a 70 y.o. male    new client to this physician- extensive health hx 2010-22 Stemi cvd Renal, Insulin insuff musculoskeletal concerns reviewed  2015 st elevation reported with atypical ch pn ishemic infarction with successful bypass surgery      With hx htn, hyperlipidemia, previously undiagnosed hyperglycemia 2018 has received injection therapy Rt shoulder degenerative arthritis for which surgery is now being considered as he can not elevate arm above 90* 2019-2020 reduced kidney filtration which reportedly occurred when treat for severe chronic unresolved sinusitis with steriod for prolonged periods this resolved when steriod were discontinued ` Type 2 Dm hyperlipidemia have been managed by medical and diet specific date of onset is unclear. +Rt shoulder with increasing LBP reported. Client reports that Lt  Hip pain and rt knee pain have recently appeared     Past Medical History:  Diagnosis Date  . GERD (gastroesophageal reflux disease)   . Heart attack (HCC) 03/05/2013   Coronary stent placement.  . Hypertension   . Sessile polyp     Patient Active Problem List   Diagnosis Date Noted  . Chronic kidney disease 11/26/2019  . Disorder of bursae of shoulder region 12/11/2018  . Contusion of shoulder region 12/11/2018  . Right inguinal hernia 03/20/2014  . Essential hypertension 05/15/2013  . Ischemic cardiomyopathy 03/14/2013  . Pure hypercholesterolemia 03/14/2013  . Coronary artery disease involving native coronary artery of native heart without angina pectoris 03/07/2013  . Diabetes mellitus (HCC) 03/07/2013  . GERD (gastroesophageal reflux disease) 03/07/2013  . STEMI (ST elevation myocardial infarction) (HCC) 03/07/2013  . cardiac cath 2013    Past Surgical History:  Procedure Laterality Date  . COLONOSCOPY  2013  . CORONARY ANGIOPLASTY WITH STENT PLACEMENT    . HERNIA REPAIR Right 03/26/14   Direct and indirect hernia noted, large ultra Pro mesh  utilized.    medications           ALPRAZolam (XANAX) 0.5 MG tablet  no diagnosis found      amoxicillin-clavulanate (AUGMENTIN) 875-125 MG tablet      aspirin 81 MG EC tablet      atorvastatin (LIPITOR) 80 MG tablet    hyperlipidemia      cetirizine (ZYRTEC) 10 MG tablet    allergy      clopidogrel (PLAVIX) 75 MG tablet    cardiology      clotrimazole-betamethasone (LOTRISONE) cream      diclofenac sodium (VOLTAREN) 1 % GEL   Rt shoulder pn      empagliflozin (JARDIANCE) 10 MG TABS tablet  niddm      ezetimibe (ZETIA) 10 MG tablet      hyperlipidemia      fexofenadine-pseudoephedrine (ALLEGRA-D) 60-120 MG 12 hr tablet      fluticasone (FLONASE) 50 MCG/ACT nasal spray   sinus      ipratropium (ATROVENT) 0.06 % nasal spray    sinus      isosorbide mononitrate (IMDUR) 30 MG 24 hr tablet  cardiology      lisinopril (ZESTRIL) 10 MG tablet     htn consder adjusting      metoprolol succinate (TOPROL-XL) 50 MG 24 hr tablet      metoprolol tartrate (LOPRESSOR) 25 MG tablet   cardiology      metroNIDAZOLE (METROGEL) 1 % gel      montelukast (SINGULAIR) 10 MG tablet   allergy      mupirocin ointment (BACTROBAN) 2 %      nitroGLYCERIN (NITROSTAT) 0.4  MG SL tablet  cardiology      pantoprazole (PROTONIX) 40 MG tablet      sertraline (ZOLOFT) 50 MG tablet   No epic diagnosis      spironolactone (ALDACTONE) 25 MG tablet      traZODone (DESYREL) 50 MG tablet  No epic diagnosis found               Allergies Other and Pollen extract  Reports prior testing for specific antigens has been neg  but has major allergy reactions in spring and fall  Problem Relation  . Lung cancer Father  . Skin cancer Brother   Social History Social History   Tobacco Use  . Smoking status: Never Smoker  . Smokeless tobacco: Never Used  Substance Use Topics  . Alcohol use: Yes    Alcohol/week: 0.0 standard drinks  . Drug use: No   Review of Systems Constitutional: No fever/chills Eyes: No visual  changes. ENT: No sore throat. Cardiovascular: Denies recent chest pain. Respiratory: Denies shortness of breath. Gastrointestinal: No abdominal pain.  No nausea, no vomiting.  No diarrhea.  No constipation. Genitourinary: Negative for dysuria. Musculoskeletal:  lowBack rt shoulder rt post knee discomfort reported. Skin: Negative for rash. thin Neurological: Negative for headaches, focal weakness or numbness. Psychiatric: stable mood Allergic severity spring fall extrinsic allergy reported  ____________________________________________   PHYSICAL EXAM:  VITAL SIGNS:  119/81  Constitutional: Alert and oriented. Well appearing and in no acute distress. Eyes: Conjunctivae are normal. PERRL. EOMI. Fundi exam nl light reflex,  +av narrowing changes with no exudates seen in the undilated eye Head: Atraumatic. Nose: No congestion/rhinnorhea. Mouth/Throat: Mucous membranes are moist.  Oropharynx non-erythematous. Neck: No stridor. No cervical spine tenderness to palpation. No cervical lymphadenopathy. Cardiovascular: Normal rate, regular rhythm. Grossly normal heart sounds.  Good peripheral circulation. Respiratory: Normal respiratory effort.  No retractions. Lungs CTAB. Gastrointestinal: Soft and nontender.distendeed abd girth 40'. No abdominal bruits Liver 21finger below ICM in supine position, nontender.  guiac is neg Genitourinary: 2 scrotal testes no hernia, prostrate not nodular or tender 3.5 x3' Musculoskeletal: No lower extremity tenderness nor edema.  No joint effusions. Reduced muscle bulk and tone present. Pelvic tilt lt short leg, tenderness in rt popliteus with cyst.  Spasm of quadratus lumborum with mild tenderness appreciated   Restricted ROM rt shoulder with crepitus which is being concerned  for surgical intervention Neurologic:  Normal speech and language. No gross focal neurologic deficits are appreciated. No gait instability. slight antalgic gait due to let hip  discomfort Skin:  Skin is warm, dry and intact. No rash noted. Psychiatric: Mood and affect are normal. Speech and behavior are normal. ____________________________________________   LABS  2y comparison Glucose 109Hi (03/19/20) 109Hi   113Hi     Hemoglobin A1C 6.5High  (03/19/20) 6.5High  6.6High  6.5Abnormal   6.4High     urinalysis dipstick       Color, UA yellow pH, UA 6.0  Clarity, UA clear Protein, UA Negative  Glucose, UA PositiveAbnormal  Urobilinogen, UA 0.2 E.U./dL  Bilirubin, UA negative Nitrite, UA negative  Ketones, UA negative Leukocytes, UA Negative  Spec Grav, UA 1.015 Appearance light     Glucose 109High mg/dL Chol/HDL Ratio 2.2 ratio   Uric Acid 4.9 mg/dL  Estimated CHD Risk < 0.5 times avg.   BUN 19 mg/dL TSH 2.505 uIU/mL  Creatinine, Ser 1.14 mg/dL T4, Total 5.6 ug/dL  GFR calc non Af Amer 66 mL/min/1.73 T3 Uptake Ratio 22Low %  GFR  calc Af Amer 76 mL/min/1.73  Free Thyroxine Index 1.2  BUN/Creatinine Ratio 17 Prostate Specific Ag, Serum 2.9 ng/mL   Sodium 137 mmol/L WBC 6.3 x10E3/uL  Potassium 4.6 mmol/L RBC 5.58 x10E6/uL  Chloride 100 mmol/L Hemoglobin 17.4 g/dL  Calcium 9.6 mg/dL Hematocrit 02.5ENID %  Phosphorus 4.0 mg/dL MCV 92 fL  Total Protein 6.9 g/dL MCH 78.2 pg  Albumin 4.6 g/dL MCHC 42.3 g/dL  Globulin, Total 2.3 g/dL RDW 53.6 %  Albumin/Globulin Ratio 2.0 Platelets 236 x10E3/uL  Bilirubin Total 0.5 mg/dL Neutrophils 69 %  Alkaline Phosphatase 82 IU/L Lymphs 22 %  LDH 219 IU/L Monocytes 7 %  AST 28 IU/L Eos 1 %  ALT 36 IU/L Basos 1 %  GGT 26 IU/L Neutrophils Absolute 4.4 x10E3/uL  Iron 105 ug/dL Lymphocytes Absolute 1.4 x10E3/uL  Cholesterol, Total 109 mg/dL Monocytes Absolute 0.5 x10E3/uL  Triglycerides 64 mg/dL EOS (ABSOLUTE) 0.1 R44R1/VQ  HDL 49 mg/dL Basophils Absolute 0.0 x10E3/uL  VLDL Cholesterol Cal 14 mg/dL Immature Granulocytes 0 %  LDL Chol Calc (NIH) 46 mg/dL     Results of 0/09/6759    10/22/2019  02/04/2020 03/19/2020   Sodium Latest Ref Range: 134 - 144 mmol/L    137  Potassium Latest Ref Range: 3.5 - 5.2 mmol/L    4.6  Chloride Latest Ref Range: 96 - 106 mmol/L    100  Glucose Latest Ref Range: 65 - 99 mg/dL    950 (H)  BUN Latest Ref Range: 8 - 27 mg/dL 22   19  Creatinine Latest Ref Range: 0.76 - 1.27 mg/dL 9.32 (H)   6.71  Calcium Latest Ref Range: 8.6 - 10.2 mg/dL    9.6  BUN/Creatinine Ratio Latest Ref Range: 10 - 24  15   17   Phosphorus Latest Ref Range: 2.8 - 4.1 mg/dL    4.0  Alkaline Phosphatase Latest Ref Range: 44 - 121 IU/L    82  Albumin Latest Ref Range: 3.8 - 4.8 g/dL    4.6  Albumin/Globulin Ratio Latest Ref Range: 1.2 - 2.2     2.0  Uric Acid Latest Ref Range: 3.8 - 8.4 mg/dL    4.9  AST Latest Ref Range: 0 - 40 IU/L    28  ALT Latest Ref Range: 0 - 44 IU/L    36  Total Protein Latest Ref Range: 6.0 - 8.5 g/dL    6.9  Total Bilirubin Latest Ref Range: 0.0 - 1.2 mg/dL    0.5  GGT Latest Ref Range: 0 - 65 IU/L    26  GFR, Est Non African American Latest Ref Range: >59 mL/min/1.73 49 (L)   66  GFR, Est African American Latest Ref Range: >59 mL/min/1.73 56 (L)   76  Estimated CHD Risk Latest Ref Range: 0.0 - 1.0 times avg.    < 0.5  LDH Latest Ref Range: 121 - 224 IU/L    219  Total CHOL/HDL Ratio Latest Ref Range: 0.0 - 5.0 ratio    2.2  Cholesterol, Total Latest Ref Range: 100 - 199 mg/dL    245  HDL Cholesterol Latest Ref Range: >39 mg/dL    49  MICROALB/CREAT RATIO Latest Ref Range: 0 - 29 mg/g creat    9  Triglycerides Latest Ref Range: 0 - 149 mg/dL    64  VLDL Cholesterol Cal Latest Ref Range: 5 - 40 mg/dL    14  LDL Chol Calc (NIH) Latest Ref Range: 0 - 99 mg/dL    46  Iron Latest Ref Range: 38 - 169 ug/dL    782105  Globulin, Total Latest Ref Range: 1.5 - 4.5 g/dL    2.3  WBC Latest Ref Range: 3.4 - 10.8 x10E3/uL    6.3  RBC Latest Ref Range: 4.14 - 5.80 x10E6/uL    5.58  Hemoglobin Latest Ref Range: 13.0 - 17.7 g/dL    95.617.4  HCT Latest Ref Range: 37.5 -  51.0 %    51.5 (H)  MCV Latest Ref Range: 79 - 97 fL    92  MCH Latest Ref Range: 26.6 - 33.0 pg    31.2  MCHC Latest Ref Range: 31.5 - 35.7 g/dL    21.333.8  RDW Latest Ref Range: 11.6 - 15.4 %    12.7  Platelets Latest Ref Range: 150 - 450 x10E3/uL    236  Neutrophils Latest Ref Range: Not Estab. %    69  Immature Granulocytes Latest Ref Range: Not Estab. %    0  NEUT# Latest Ref Range: 1.4 - 7.0 x10E3/uL    4.4  Lymphocyte # Latest Ref Range: 0.7 - 3.1 x10E3/uL    1.4  Monocytes Absolute Latest Ref Range: 0.1 - 0.9 x10E3/uL    0.5  Basophils Absolute Latest Ref Range: 0.0 - 0.2 x10E3/uL    0.0  Immature Grans (Abs) Latest Ref Range: 0.0 - 0.1 x10E3/uL    0.0  Lymphs Latest Ref Range: Not Estab. %    22  Monocytes Latest Ref Range: Not Estab. %    7  Basos Latest Ref Range: Not Estab. %    1  Eos Latest Ref Range: Not Estab. %    1  EOS (ABSOLUTE) Latest Ref Range: 0.0 - 0.4 x10E3/uL    0.1  Hemoglobin A1C Latest Ref Range: 4.8 - 5.6 % 6.6 (H)   6.5 (H)  TSH Latest Ref Range: 0.450 - 4.500 uIU/mL    1.240  Thyroxine (T4) Latest Ref Range: 4.5 - 12.0 ug/dL    5.6  Free Thyroxine Index Latest Ref Range: 1.2 - 4.9     1.2  T3 Uptake Ratio Latest Ref Range: 24 - 39 %    22 (L)  Prostate Specific Ag, Serum Latest Ref Range: 0.0 - 4.0 ng/mL    2.9  NOVEL CORONAVIRUS, NAA Unknown   Rpt   SARS-CoV-2, NAA 2 DAY TAT Unknown   Performed   Microalbumin, Urine Latest Ref Range: Not Estab. ug/mL    6.0  Creatinine, Urine Latest Ref Range: Not Estab. mg/dL    08.669.4  US RENAL Unknown  Rpt     EKG  Review and comparison: 09/02/11 rate 93, Lt atrial enlargement 02/25/19 sinus bradycardia, s/p Ant.septal MI with pvc 03/24/20 sinus bradycardia St-t wave elevation noted ld2 v5,6 with  above hx consider continuing microangiopathic vessel disease    Angiogram not available for review   INITIAL IMPRESSION /ASSESSMENT  Patient is new patient to this physician Annual Exam with review of finding 2010-22 Hx  GERDs resolved Chronic renal disease- resolved 2022 after d/c protracted use of steriod for treatment of allergy  Htn- currently controlled with ACEi Zesteril will discuss with client rx options   Hx extrinisic Allergy significantly effected in spring fall tx atrovent flonase. As this suggest major periods of inflammation I have found pre season internal cleansing with use of flavanoid. Quercitin 300mg  am/pm 2 tabs started 4wk prior to season extreme effect in some client for symptoms- spring reduction fall resolution.  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 Current lab both blood urine glucose hi. this has influence on healing capacity,cardiovas, eye, renal health.  Referred for Ophthalmology yearly exam   Hx Lbp reduced lordosis, rt shoulder osteoarthritis   Suggest adding back brace for support. Roboxin for spasm. Client may be helped by manual manipulation after baseline lumbar Xray for spondylolisthesis smorls or scheuermann changes and degree of pelvic tilt are evaluated     F/u ortho for  Chronic rt shoulder

## 2020-03-28 MED ORDER — METHOCARBAMOL 500 MG PO TABS: 500.0000 mg | ORAL_TABLET | Freq: Three times a day (TID) | ORAL | 0 refills | Status: AC

## 2020-03-28 MED ORDER — LUMBAR BACK BRACE/SUPPORT PAD MISC: 1.0000 [IU] | Freq: Every day | 0 refills | Status: AC

## 2020-03-29 ENCOUNTER — Ambulatory Visit
Admission: RE | Admit: 2020-03-29 | Discharge: 2020-03-29 | Disposition: A | Discharge status: Home / Self Care | Payer: 59 | Source: Ambulatory Visit | Attending: Adult Medicine | Admitting: Adult Medicine

## 2020-03-29 ENCOUNTER — Other Ambulatory Visit: Payer: Self-pay

## 2020-03-29 DIAGNOSIS — M545 Low back pain, unspecified: Secondary | ICD-10-CM | POA: Insufficient documentation

## 2020-03-29 NOTE — Progress Notes (Signed)
Contacted [facility] & advised him that Dr. Pricilla Handler ordered a lumbar xray & that he can go to [facility] to have it done.  No appt needed - walk-ins.  Gave him location. Verbalized understanding.  Also, informed him that Dr. Fran Lowes wanted him to have an eye exam.  York Spaniel he already has an Opth & will schedule his own appt.  AMD

## 2020-05-03 ENCOUNTER — Other Ambulatory Visit: Payer: Self-pay | Admitting: Emergency Medicine

## 2020-05-03 DIAGNOSIS — I10 Essential (primary) hypertension: Secondary | ICD-10-CM

## 2020-05-03 DIAGNOSIS — J309 Allergic rhinitis, unspecified: Secondary | ICD-10-CM | POA: Diagnosis not present

## 2020-05-03 DIAGNOSIS — J0141 Acute recurrent pansinusitis: Secondary | ICD-10-CM | POA: Diagnosis not present

## 2020-05-05 DIAGNOSIS — M25511 Pain in right shoulder: Secondary | ICD-10-CM | POA: Diagnosis not present

## 2020-05-05 DIAGNOSIS — M19011 Primary osteoarthritis, right shoulder: Secondary | ICD-10-CM | POA: Diagnosis not present

## 2020-05-14 DIAGNOSIS — I255 Ischemic cardiomyopathy: Secondary | ICD-10-CM | POA: Diagnosis not present

## 2020-05-14 DIAGNOSIS — I2510 Atherosclerotic heart disease of native coronary artery without angina pectoris: Secondary | ICD-10-CM | POA: Diagnosis not present

## 2020-05-14 DIAGNOSIS — I10 Essential (primary) hypertension: Secondary | ICD-10-CM | POA: Diagnosis not present

## 2020-05-14 DIAGNOSIS — E7800 Pure hypercholesterolemia, unspecified: Secondary | ICD-10-CM | POA: Diagnosis not present

## 2020-05-22 DIAGNOSIS — M19011 Primary osteoarthritis, right shoulder: Secondary | ICD-10-CM | POA: Diagnosis not present

## 2020-05-24 ENCOUNTER — Other Ambulatory Visit: Payer: Self-pay | Admitting: Physician Assistant

## 2020-05-24 ENCOUNTER — Ambulatory Visit
Admission: RE | Admit: 2020-05-24 | Discharge: 2020-05-24 | Disposition: A | Discharge status: Home / Self Care | Payer: 59 | Source: Ambulatory Visit | Attending: Physician Assistant | Admitting: Physician Assistant

## 2020-05-24 ENCOUNTER — Other Ambulatory Visit: Payer: Self-pay

## 2020-05-24 DIAGNOSIS — J430 Unilateral pulmonary emphysema [MacLeod's syndrome]: Secondary | ICD-10-CM | POA: Insufficient documentation

## 2020-05-24 DIAGNOSIS — J439 Emphysema, unspecified: Secondary | ICD-10-CM | POA: Diagnosis not present

## 2020-05-27 ENCOUNTER — Other Ambulatory Visit: Payer: Self-pay

## 2020-05-28 ENCOUNTER — Ambulatory Visit: Payer: Self-pay

## 2020-05-28 ENCOUNTER — Other Ambulatory Visit: Payer: Self-pay

## 2020-05-28 DIAGNOSIS — I2510 Atherosclerotic heart disease of native coronary artery without angina pectoris: Secondary | ICD-10-CM | POA: Diagnosis not present

## 2020-05-28 DIAGNOSIS — M25511 Pain in right shoulder: Secondary | ICD-10-CM | POA: Diagnosis not present

## 2020-05-28 DIAGNOSIS — M19011 Primary osteoarthritis, right shoulder: Secondary | ICD-10-CM | POA: Diagnosis not present

## 2020-05-28 DIAGNOSIS — Z01818 Encounter for other preprocedural examination: Secondary | ICD-10-CM

## 2020-05-28 DIAGNOSIS — Z6824 Body mass index (BMI) 24.0-24.9, adult: Secondary | ICD-10-CM | POA: Diagnosis not present

## 2020-05-28 NOTE — Progress Notes (Signed)
Presents to [facility] requesting an EKG. He was at surgery pre-op [facility] (Dr. Calton Dach). Having shoulder surgery on 06/15/20.  Needs EKG sent to Dr. Calton Dach [facility] [phone] [place]  [address] Phone:  [phone]  AMD

## 2020-05-31 DIAGNOSIS — E1122 Type 2 diabetes mellitus with diabetic chronic kidney disease: Secondary | ICD-10-CM | POA: Diagnosis not present

## 2020-05-31 DIAGNOSIS — I10 Essential (primary) hypertension: Secondary | ICD-10-CM | POA: Diagnosis not present

## 2020-05-31 DIAGNOSIS — N182 Chronic kidney disease, stage 2 (mild): Secondary | ICD-10-CM | POA: Diagnosis not present

## 2020-05-31 NOTE — Progress Notes (Signed)
EKG faxed to Dr. Calton Dach at [facility]. Fax:  [phone]  AMD

## 2020-06-11 DIAGNOSIS — I2510 Atherosclerotic heart disease of native coronary artery without angina pectoris: Secondary | ICD-10-CM | POA: Diagnosis not present

## 2020-06-11 DIAGNOSIS — I255 Ischemic cardiomyopathy: Secondary | ICD-10-CM | POA: Diagnosis not present

## 2020-06-12 DIAGNOSIS — M19011 Primary osteoarthritis, right shoulder: Secondary | ICD-10-CM | POA: Diagnosis not present

## 2020-06-12 DIAGNOSIS — Z01812 Encounter for preprocedural laboratory examination: Secondary | ICD-10-CM | POA: Diagnosis not present

## 2020-06-14 DIAGNOSIS — M19011 Primary osteoarthritis, right shoulder: Secondary | ICD-10-CM | POA: Insufficient documentation

## 2020-06-15 DIAGNOSIS — Z7982 Long term (current) use of aspirin: Secondary | ICD-10-CM | POA: Diagnosis not present

## 2020-06-15 DIAGNOSIS — N189 Chronic kidney disease, unspecified: Secondary | ICD-10-CM | POA: Diagnosis not present

## 2020-06-15 DIAGNOSIS — G8918 Other acute postprocedural pain: Secondary | ICD-10-CM | POA: Diagnosis not present

## 2020-06-15 DIAGNOSIS — Z955 Presence of coronary angioplasty implant and graft: Secondary | ICD-10-CM | POA: Diagnosis not present

## 2020-06-15 DIAGNOSIS — M19011 Primary osteoarthritis, right shoulder: Secondary | ICD-10-CM | POA: Diagnosis not present

## 2020-06-15 DIAGNOSIS — E1122 Type 2 diabetes mellitus with diabetic chronic kidney disease: Secondary | ICD-10-CM | POA: Diagnosis not present

## 2020-06-15 DIAGNOSIS — I2510 Atherosclerotic heart disease of native coronary artery without angina pectoris: Secondary | ICD-10-CM | POA: Diagnosis not present

## 2020-06-15 DIAGNOSIS — I252 Old myocardial infarction: Secondary | ICD-10-CM | POA: Diagnosis not present

## 2020-06-15 DIAGNOSIS — Z471 Aftercare following joint replacement surgery: Secondary | ICD-10-CM | POA: Diagnosis not present

## 2020-06-15 DIAGNOSIS — Z7984 Long term (current) use of oral hypoglycemic drugs: Secondary | ICD-10-CM | POA: Diagnosis not present

## 2020-06-15 DIAGNOSIS — Z7902 Long term (current) use of antithrombotics/antiplatelets: Secondary | ICD-10-CM | POA: Diagnosis not present

## 2020-06-15 DIAGNOSIS — R69 Illness, unspecified: Secondary | ICD-10-CM | POA: Diagnosis not present

## 2020-06-15 DIAGNOSIS — Z96611 Presence of right artificial shoulder joint: Secondary | ICD-10-CM | POA: Diagnosis not present

## 2020-06-15 DIAGNOSIS — I129 Hypertensive chronic kidney disease with stage 1 through stage 4 chronic kidney disease, or unspecified chronic kidney disease: Secondary | ICD-10-CM | POA: Diagnosis not present

## 2020-06-15 DIAGNOSIS — M75101 Unspecified rotator cuff tear or rupture of right shoulder, not specified as traumatic: Secondary | ICD-10-CM | POA: Diagnosis not present

## 2020-06-15 DIAGNOSIS — Z79899 Other long term (current) drug therapy: Secondary | ICD-10-CM | POA: Diagnosis not present

## 2020-06-30 DIAGNOSIS — M19011 Primary osteoarthritis, right shoulder: Secondary | ICD-10-CM | POA: Diagnosis not present

## 2020-07-02 DIAGNOSIS — M19011 Primary osteoarthritis, right shoulder: Secondary | ICD-10-CM | POA: Diagnosis not present

## 2020-07-07 DIAGNOSIS — M19011 Primary osteoarthritis, right shoulder: Secondary | ICD-10-CM | POA: Diagnosis not present

## 2020-07-09 DIAGNOSIS — M19011 Primary osteoarthritis, right shoulder: Secondary | ICD-10-CM | POA: Diagnosis not present

## 2020-07-13 DIAGNOSIS — M19011 Primary osteoarthritis, right shoulder: Secondary | ICD-10-CM | POA: Diagnosis not present

## 2020-07-16 DIAGNOSIS — M19011 Primary osteoarthritis, right shoulder: Secondary | ICD-10-CM | POA: Diagnosis not present

## 2020-07-20 DIAGNOSIS — M19011 Primary osteoarthritis, right shoulder: Secondary | ICD-10-CM | POA: Diagnosis not present

## 2020-07-23 DIAGNOSIS — M19011 Primary osteoarthritis, right shoulder: Secondary | ICD-10-CM | POA: Diagnosis not present

## 2020-07-26 ENCOUNTER — Other Ambulatory Visit: Payer: Self-pay

## 2020-07-26 DIAGNOSIS — F419 Anxiety disorder, unspecified: Secondary | ICD-10-CM

## 2020-07-26 DIAGNOSIS — M19011 Primary osteoarthritis, right shoulder: Secondary | ICD-10-CM | POA: Diagnosis not present

## 2020-07-26 MED ORDER — ALPRAZOLAM 0.5 MG PO TABS: 0.5000 mg | ORAL_TABLET | Freq: Two times a day (BID) | ORAL | 0 refills | Status: DC | PRN

## 2020-07-28 DIAGNOSIS — M19011 Primary osteoarthritis, right shoulder: Secondary | ICD-10-CM | POA: Diagnosis not present

## 2020-07-30 ENCOUNTER — Other Ambulatory Visit: Payer: Self-pay

## 2020-07-30 DIAGNOSIS — I10 Essential (primary) hypertension: Secondary | ICD-10-CM

## 2020-07-30 MED ORDER — LISINOPRIL 10 MG PO TABS: 10.0000 mg | ORAL_TABLET | Freq: Every day | ORAL | 3 refills | Status: DC

## 2020-07-30 MED ORDER — SPIRONOLACTONE 25 MG PO TABS: 12.5000 mg | ORAL_TABLET | Freq: Every day | ORAL | 3 refills | Status: DC

## 2020-08-03 DIAGNOSIS — M19011 Primary osteoarthritis, right shoulder: Secondary | ICD-10-CM | POA: Diagnosis not present

## 2020-08-04 DIAGNOSIS — M19011 Primary osteoarthritis, right shoulder: Secondary | ICD-10-CM | POA: Diagnosis not present

## 2020-08-06 DIAGNOSIS — M25511 Pain in right shoulder: Secondary | ICD-10-CM | POA: Diagnosis not present

## 2020-08-06 DIAGNOSIS — Z96611 Presence of right artificial shoulder joint: Secondary | ICD-10-CM | POA: Diagnosis not present

## 2020-08-12 DIAGNOSIS — M19011 Primary osteoarthritis, right shoulder: Secondary | ICD-10-CM | POA: Diagnosis not present

## 2020-08-13 DIAGNOSIS — M19011 Primary osteoarthritis, right shoulder: Secondary | ICD-10-CM | POA: Diagnosis not present

## 2020-08-17 ENCOUNTER — Other Ambulatory Visit: Payer: Self-pay

## 2020-08-17 ENCOUNTER — Ambulatory Visit: Payer: Self-pay | Admitting: Physician Assistant

## 2020-08-17 ENCOUNTER — Encounter: Payer: Self-pay | Admitting: Physician Assistant

## 2020-08-17 VITALS — BP 118/67 | HR 43 | Temp 97.5°F | Resp 12

## 2020-08-17 DIAGNOSIS — J0100 Acute maxillary sinusitis, unspecified: Secondary | ICD-10-CM

## 2020-08-17 DIAGNOSIS — M19011 Primary osteoarthritis, right shoulder: Secondary | ICD-10-CM | POA: Diagnosis not present

## 2020-08-17 MED ORDER — FEXOFENADINE-PSEUDOEPHED ER 60-120 MG PO TB12
1.0000 | ORAL_TABLET | Freq: Two times a day (BID) | ORAL | 0 refills | Status: DC
Start: 2020-08-17 — End: 2023-03-07

## 2020-08-17 MED ORDER — AZITHROMYCIN 250 MG PO TABS: ORAL_TABLET | ORAL | 0 refills | Status: AC

## 2020-08-17 NOTE — Progress Notes (Signed)
   Subjective: Sinus congestion    Patient ID: Gabriel Butler, male    DOB: 24-Apr-1950, 70 y.o.   MRN: [id number]  HPI Patient presents for approximately 7 to 10 days of sinus congestion.  Patient also states intermitting postnasal drainage.  Patient denies headache or ear pressure/pain.  Patient denies vision disturbance or vertigo.  No palliative measure for complaint.   Review of Systems Allergic rhinitis, CAD, diabetes, GERD, hyperlipidemia,and hypertension.    Objective:   Physical Exam No acute distress.  Temperature 97.5, pulse is 43, respiration 12, BP is 118/67, patient 97 O2 sat on room air. HEENT is remarkable edematous nasal turbinates thick rhinorrhea.  Bilateral maxillary guarding.  Postnasal drainage.  Neck is supple without adenopathy or bruits.  Lungs clear to auscultation.  Heart is bradycardic.        Assessment & Plan: Subacute maxillary sinusitis.   Patient given discharge care instruction prescription for Z-Pak and fexofenadine/Sudafed.  Patient advised to follow-up in 5 to 7 days if no improvement or worsening complaint.

## 2020-08-17 NOTE — Progress Notes (Signed)
Home Covid Test today = Negative  S/Sx - Sunday Tired, Cough,  Sinus congestion Sinus pressure No ear discomfort Throat - "crusty" from sinus drainage  AMD

## 2020-08-20 DIAGNOSIS — M19011 Primary osteoarthritis, right shoulder: Secondary | ICD-10-CM | POA: Diagnosis not present

## 2020-08-24 DIAGNOSIS — M19011 Primary osteoarthritis, right shoulder: Secondary | ICD-10-CM | POA: Diagnosis not present

## 2020-08-25 DIAGNOSIS — M19011 Primary osteoarthritis, right shoulder: Secondary | ICD-10-CM | POA: Diagnosis not present

## 2020-08-31 ENCOUNTER — Other Ambulatory Visit: Payer: Self-pay | Admitting: Physician Assistant

## 2020-08-31 ENCOUNTER — Ambulatory Visit: Payer: 59 | Admitting: Dermatology

## 2020-08-31 DIAGNOSIS — Z9109 Other allergy status, other than to drugs and biological substances: Secondary | ICD-10-CM

## 2020-08-31 DIAGNOSIS — N401 Enlarged prostate with lower urinary tract symptoms: Secondary | ICD-10-CM | POA: Diagnosis not present

## 2020-08-31 DIAGNOSIS — R351 Nocturia: Secondary | ICD-10-CM | POA: Diagnosis not present

## 2020-08-31 DIAGNOSIS — Z79899 Other long term (current) drug therapy: Secondary | ICD-10-CM | POA: Diagnosis not present

## 2020-09-01 DIAGNOSIS — M19011 Primary osteoarthritis, right shoulder: Secondary | ICD-10-CM | POA: Diagnosis not present

## 2020-09-02 DIAGNOSIS — N401 Enlarged prostate with lower urinary tract symptoms: Secondary | ICD-10-CM | POA: Diagnosis not present

## 2020-09-06 ENCOUNTER — Other Ambulatory Visit: Payer: Self-pay

## 2020-09-06 ENCOUNTER — Telehealth: Payer: Self-pay | Admitting: Physician Assistant

## 2020-09-06 DIAGNOSIS — Z1152 Encounter for screening for COVID-19: Secondary | ICD-10-CM

## 2020-09-06 DIAGNOSIS — M7062 Trochanteric bursitis, left hip: Secondary | ICD-10-CM | POA: Diagnosis not present

## 2020-09-06 DIAGNOSIS — M159 Polyosteoarthritis, unspecified: Secondary | ICD-10-CM | POA: Diagnosis not present

## 2020-09-06 DIAGNOSIS — M545 Low back pain, unspecified: Secondary | ICD-10-CM | POA: Diagnosis not present

## 2020-09-06 DIAGNOSIS — G8929 Other chronic pain: Secondary | ICD-10-CM | POA: Diagnosis not present

## 2020-09-06 MED ORDER — FEXOFENADINE-PSEUDOEPHED ER 60-120 MG PO TB12: 1.0000 | ORAL_TABLET | Freq: Two times a day (BID) | ORAL | 2 refills | Status: DC

## 2020-09-06 MED ORDER — AMOXICILLIN 875 MG PO TABS: 875.0000 mg | ORAL_TABLET | Freq: Two times a day (BID) | ORAL | 0 refills | Status: AC

## 2020-09-06 NOTE — Progress Notes (Signed)
July 30 symptoms started. Pt states he always gets sinus fits around this time. CL,RMA

## 2020-09-06 NOTE — Progress Notes (Signed)
   Subjective: Sinus congestion    Patient ID: Gabriel Butler, male    DOB: Jun 26, 1950, 70 y.o.   MRN: [id number]  HPI Patient presents with sinus congestion thick nasal discharge and mild cough for approximately a week and a half.  Patient tested negative for COVID-19 rapid test and PCR is pending.   Review of Systems CAD, CKD, GERD, hyperlipidemia, and hypertension.    Objective:   Physical Exam Deferred as this is a teleconference visit.       Assessment & Plan: Sinusitis   Advise patient prescription for Allegra-D and amoxicillin will be generated.  Pick up medication and take as directed.  Follow-up in 3 days if there is no improvement or worsening complaint.

## 2020-09-08 LAB — NOVEL CORONAVIRUS, NAA: SARS-CoV-2, NAA: NOT DETECTED

## 2020-09-08 LAB — SARS-COV-2, NAA 2 DAY TAT

## 2020-09-10 DIAGNOSIS — M19011 Primary osteoarthritis, right shoulder: Secondary | ICD-10-CM | POA: Diagnosis not present

## 2020-09-22 DIAGNOSIS — M19011 Primary osteoarthritis, right shoulder: Secondary | ICD-10-CM | POA: Diagnosis not present

## 2020-09-29 DIAGNOSIS — M19011 Primary osteoarthritis, right shoulder: Secondary | ICD-10-CM | POA: Diagnosis not present

## 2020-10-12 ENCOUNTER — Other Ambulatory Visit: Payer: Self-pay

## 2020-10-12 DIAGNOSIS — Z1152 Encounter for screening for COVID-19: Secondary | ICD-10-CM

## 2020-10-12 NOTE — Progress Notes (Signed)
Wife tested positive 2 days ago.

## 2020-10-14 ENCOUNTER — Ambulatory Visit: Payer: Self-pay | Admitting: Physician Assistant

## 2020-10-14 ENCOUNTER — Other Ambulatory Visit: Payer: Self-pay

## 2020-10-14 ENCOUNTER — Encounter: Payer: Self-pay | Admitting: Physician Assistant

## 2020-10-14 VITALS — BP 118/86 | HR 63 | Temp 97.6°F | Resp 14 | Ht 75.0 in | Wt 201.0 lb

## 2020-10-14 DIAGNOSIS — J0100 Acute maxillary sinusitis, unspecified: Secondary | ICD-10-CM

## 2020-10-14 LAB — SARS-COV-2, NAA 2 DAY TAT

## 2020-10-14 LAB — NOVEL CORONAVIRUS, NAA: SARS-CoV-2, NAA: NOT DETECTED

## 2020-10-14 MED ORDER — AZITHROMYCIN 250 MG PO TABS: ORAL_TABLET | ORAL | 0 refills | Status: AC

## 2020-10-14 MED ORDER — SULFAMETHOXAZOLE-TRIMETHOPRIM 800-160 MG PO TABS
1.0000 | ORAL_TABLET | Freq: Two times a day (BID) | ORAL | 0 refills | Status: DC
Start: 2020-10-14 — End: 2021-02-24

## 2020-10-14 MED ORDER — METHYLPREDNISOLONE 4 MG PO TBPK: ORAL_TABLET | ORAL | 0 refills | Status: DC

## 2020-10-14 NOTE — Progress Notes (Signed)
For about 8-10 days pt has been having drainage and voice hoarseness.

## 2020-10-14 NOTE — Progress Notes (Signed)
   Subjective: Sinusitis    Patient ID: Branch Pacitti, male    DOB: Apr 30, 1950, 70 y.o.   MRN: [id number]  HPI Patient presents with 10 days of sinus congestion, facial pain, postnasal drainage, and a mild nonproductive cough.  Patient denies fever chills associated complaint.  Patient tested negative for COVID-19 and strep pharyngitis today.  Patient wife tested positive for COVID-19 4 days ago.   Review of Systems Anxiety, GERD, hyperlipidemia, hypertension, insomnia, and seasonal rhinitis.    Objective:   Physical Exam  No acute distress.  Temperature 97.6, pulse 63, respiration 14, BP is 118/86, and patient is 96% O2 sat on room air.  Patient weighs 201 pounds and BMI is 25.1. HEENT is remarkable for bilateral maxillary guarding, edematous nasal turbinates.  Copious postnasal drainage.  Edematous but not erythematous bilateral TMs.  Neck is supple for adenopathy or bruits.  Lungs have bilateral upper lobe Rales.  Heart is regular rate and rhythm.      Assessment & Plan: Sinusitis   Patient given discharge care instructions and a prescription for Bactrim DS, fexofenadine and Sudafed, Medrol Dosepak.  Patient advised to follow-up in 5 days if no improvement.

## 2020-10-20 ENCOUNTER — Other Ambulatory Visit: Payer: Self-pay

## 2020-10-20 DIAGNOSIS — F419 Anxiety disorder, unspecified: Secondary | ICD-10-CM

## 2020-10-20 MED ORDER — ALPRAZOLAM 0.5 MG PO TABS: 0.5000 mg | ORAL_TABLET | Freq: Two times a day (BID) | ORAL | 0 refills | Status: DC | PRN

## 2020-10-29 ENCOUNTER — Other Ambulatory Visit: Payer: Self-pay | Admitting: Physician Assistant

## 2020-10-29 DIAGNOSIS — E7800 Pure hypercholesterolemia, unspecified: Secondary | ICD-10-CM

## 2020-10-29 DIAGNOSIS — M25511 Pain in right shoulder: Secondary | ICD-10-CM | POA: Diagnosis not present

## 2020-10-29 DIAGNOSIS — Z96611 Presence of right artificial shoulder joint: Secondary | ICD-10-CM | POA: Diagnosis not present

## 2020-11-09 ENCOUNTER — Ambulatory Visit: Payer: Self-pay | Admitting: Physician Assistant

## 2020-11-09 ENCOUNTER — Encounter: Payer: Self-pay | Admitting: Physician Assistant

## 2020-11-09 ENCOUNTER — Other Ambulatory Visit: Payer: Self-pay | Admitting: Physician Assistant

## 2020-11-09 DIAGNOSIS — Z8709 Personal history of other diseases of the respiratory system: Secondary | ICD-10-CM

## 2020-11-09 MED ORDER — METHYLPREDNISOLONE 4 MG PO TBPK: ORAL_TABLET | ORAL | 0 refills | Status: DC

## 2020-11-09 MED ORDER — FEXOFENADINE-PSEUDOEPHED ER 60-120 MG PO TB12: 1.0000 | ORAL_TABLET | Freq: Two times a day (BID) | ORAL | 2 refills | Status: DC

## 2020-11-09 NOTE — Progress Notes (Signed)
S/Sx started - never got better from when he saw Ron 3 weeks ago Sinus congestion Sinus drainage Pressure around eyes Ears fill a little stopped up Cough - grayish phlegm  Taking Claritin without relief  Meds Ron gave him 3 weeks ago helped some  AMD

## 2020-11-09 NOTE — Progress Notes (Signed)
   Subjective: Sinus congestion    Patient ID: Gabriel Butler, male    DOB: [birth year], 70 y.o.   MRN: [id number]  HPI Patient called clinic stating his sinuses are congested.  Patient has a long history of allergic rhinitis, sinus congestion, sinus infections.  Patient has been on repetitive doses of steroids, antibiotics, and combination decongestant antihistamines.   Review of Systems CAD, CKD, diabetes, GERD, hyperlipidemia, and hypertension.    Objective:   Physical Exam This is a virtual visit.     Assessment & Plan: Sinus congestion   Patient given prescription for fexofenadine and Medrol Dosepak.  Will obtain sinus x-rays and consult patient to ENT for definitive evaluation and treatment.

## 2020-11-10 ENCOUNTER — Ambulatory Visit
Admission: RE | Admit: 2020-11-10 | Discharge: 2020-11-10 | Disposition: A | Discharge status: Home / Self Care | Payer: 59 | Source: Ambulatory Visit | Attending: Physician Assistant | Admitting: Physician Assistant

## 2020-11-10 ENCOUNTER — Other Ambulatory Visit: Payer: Self-pay

## 2020-11-10 ENCOUNTER — Ambulatory Visit
Admission: RE | Admit: 2020-11-10 | Discharge: 2020-11-10 | Disposition: A | Discharge status: Home / Self Care | Payer: 59 | Attending: Physician Assistant | Admitting: Physician Assistant

## 2020-11-10 DIAGNOSIS — Z8709 Personal history of other diseases of the respiratory system: Secondary | ICD-10-CM | POA: Insufficient documentation

## 2020-11-10 DIAGNOSIS — R0981 Nasal congestion: Secondary | ICD-10-CM | POA: Diagnosis not present

## 2020-12-06 DIAGNOSIS — E1122 Type 2 diabetes mellitus with diabetic chronic kidney disease: Secondary | ICD-10-CM | POA: Diagnosis not present

## 2020-12-06 DIAGNOSIS — I10 Essential (primary) hypertension: Secondary | ICD-10-CM | POA: Diagnosis not present

## 2020-12-06 DIAGNOSIS — N182 Chronic kidney disease, stage 2 (mild): Secondary | ICD-10-CM | POA: Diagnosis not present

## 2020-12-16 ENCOUNTER — Other Ambulatory Visit: Payer: Self-pay

## 2020-12-16 DIAGNOSIS — F419 Anxiety disorder, unspecified: Secondary | ICD-10-CM

## 2020-12-16 MED ORDER — ALPRAZOLAM 0.5 MG PO TABS: 0.5000 mg | ORAL_TABLET | Freq: Two times a day (BID) | ORAL | 0 refills | Status: DC | PRN

## 2020-12-27 ENCOUNTER — Ambulatory Visit: Payer: Self-pay | Admitting: Physician Assistant

## 2020-12-27 ENCOUNTER — Encounter: Payer: Self-pay | Admitting: Physician Assistant

## 2020-12-27 VITALS — BP 113/84 | HR 71 | Temp 97.1°F

## 2020-12-27 DIAGNOSIS — J0100 Acute maxillary sinusitis, unspecified: Secondary | ICD-10-CM

## 2020-12-27 MED ORDER — AMOXICILLIN-POT CLAVULANATE 875-125 MG PO TABS: 1.0000 | ORAL_TABLET | Freq: Two times a day (BID) | ORAL | 0 refills | Status: DC

## 2020-12-27 MED ORDER — FEXOFENADINE-PSEUDOEPHED ER 60-120 MG PO TB12: 1.0000 | ORAL_TABLET | Freq: Two times a day (BID) | ORAL | 2 refills | Status: DC

## 2020-12-27 NOTE — Progress Notes (Signed)
   Subjective:    Patient ID: Gabriel Butler, male    DOB: 1950-09-26, 70 y.o.   MRN: [id number]  HPI Patient presents with facial pain, nasal congestion, and thick nasal drainage approximately 2 weeks.  Patient also complains of bilateral ear pain.  Denies fever associated complaint.  No recent travel or known contact with COVID-19.  Patient is a scheduled today to include boosters for COVID-19   Review of Systems CAD, diabetes, hyperlipidemia, hypertension, and seasonal rhinitis.    Objective:   Physical Exam  No acute distress.  Temperature 97.1, pulse 71, BP is 113/84, patient 90% O2 sat on room air. HEENT is remarkable for bilateral maxillary guarding.  Edematous nasal turbinates with thick rhinorrhea.  Postnasal drainage.      Assessment & Plan: Subacute maxillary sinusitis   Patient given discharge care instruction and a consult to ENT.  Patient given prescription for Augmentin and Allegra-D.

## 2020-12-27 NOTE — Progress Notes (Signed)
Pt reports recurrent sinus infections. At home COVID test negative. Congestion and pressure above bilateral eyebrows and along under eyes.  Yellow to green mucous production from nasal passages. He has experienced 3 sinus infections over the last several months.

## 2021-01-26 ENCOUNTER — Other Ambulatory Visit: Payer: Self-pay

## 2021-01-26 DIAGNOSIS — F419 Anxiety disorder, unspecified: Secondary | ICD-10-CM

## 2021-01-26 MED ORDER — ALPRAZOLAM 0.5 MG PO TABS: 0.5000 mg | ORAL_TABLET | Freq: Two times a day (BID) | ORAL | 1 refills | Status: DC | PRN

## 2021-01-31 DIAGNOSIS — H8190 Unspecified disorder of vestibular function, unspecified ear: Secondary | ICD-10-CM | POA: Diagnosis not present

## 2021-01-31 DIAGNOSIS — I2510 Atherosclerotic heart disease of native coronary artery without angina pectoris: Secondary | ICD-10-CM | POA: Diagnosis not present

## 2021-01-31 DIAGNOSIS — Z79899 Other long term (current) drug therapy: Secondary | ICD-10-CM | POA: Diagnosis not present

## 2021-01-31 DIAGNOSIS — H8110 Benign paroxysmal vertigo, unspecified ear: Secondary | ICD-10-CM | POA: Diagnosis not present

## 2021-01-31 DIAGNOSIS — I10 Essential (primary) hypertension: Secondary | ICD-10-CM | POA: Diagnosis not present

## 2021-01-31 DIAGNOSIS — Z7902 Long term (current) use of antithrombotics/antiplatelets: Secondary | ICD-10-CM | POA: Diagnosis not present

## 2021-01-31 DIAGNOSIS — Z951 Presence of aortocoronary bypass graft: Secondary | ICD-10-CM | POA: Diagnosis not present

## 2021-01-31 DIAGNOSIS — Z7982 Long term (current) use of aspirin: Secondary | ICD-10-CM | POA: Diagnosis not present

## 2021-02-02 ENCOUNTER — Telehealth: Payer: Self-pay

## 2021-02-02 ENCOUNTER — Other Ambulatory Visit: Payer: Self-pay | Admitting: Physician Assistant

## 2021-02-02 MED ORDER — AMOXICILLIN 875 MG PO TABS: 875.0000 mg | ORAL_TABLET | Freq: Two times a day (BID) | ORAL | 0 refills | Status: AC

## 2021-02-02 MED ORDER — FEXOFENADINE-PSEUDOEPHED ER 60-120 MG PO TB12: 1.0000 | ORAL_TABLET | Freq: Two times a day (BID) | ORAL | 2 refills | Status: DC

## 2021-02-02 NOTE — Telephone Encounter (Signed)
Gabriel Butler called C/O "usual sinus problems".  S/Sx started Sunday: Sinus pressure - cheeks & eyes Watery eyes Nasal drainage - yellowish-clear Cough - productive sometimes - yellowish-clear  Home Covid test = Negative  No known exposure to anyone testing positive for Covid  Requesting antibiotics for "sinus infection".  AMD

## 2021-02-08 DIAGNOSIS — H0288B Meibomian gland dysfunction left eye, upper and lower eyelids: Secondary | ICD-10-CM | POA: Diagnosis not present

## 2021-02-08 DIAGNOSIS — H0288A Meibomian gland dysfunction right eye, upper and lower eyelids: Secondary | ICD-10-CM | POA: Diagnosis not present

## 2021-02-08 DIAGNOSIS — H524 Presbyopia: Secondary | ICD-10-CM | POA: Diagnosis not present

## 2021-02-08 DIAGNOSIS — H52223 Regular astigmatism, bilateral: Secondary | ICD-10-CM | POA: Diagnosis not present

## 2021-02-24 ENCOUNTER — Ambulatory Visit: Payer: Self-pay | Admitting: Physician Assistant

## 2021-02-24 ENCOUNTER — Encounter: Payer: Self-pay | Admitting: Physician Assistant

## 2021-02-24 DIAGNOSIS — J0100 Acute maxillary sinusitis, unspecified: Secondary | ICD-10-CM

## 2021-02-24 MED ORDER — SULFAMETHOXAZOLE-TRIMETHOPRIM 800-160 MG PO TABS: 1.0000 | ORAL_TABLET | Freq: Two times a day (BID) | ORAL | 0 refills | Status: DC

## 2021-02-24 MED ORDER — FEXOFENADINE HCL 60 MG PO TABS: 60.0000 mg | ORAL_TABLET | Freq: Two times a day (BID) | ORAL | 0 refills | Status: DC

## 2021-02-24 NOTE — Progress Notes (Signed)
° °  Subjective: Sinus congestion    Patient ID: Gabriel Butler, male    DOB: 05-Feb-1951, 71 y.o.   MRN: [id number]  HPI Patient presents with recurrent sinus congestion.  Patient has schedule appointment with ENT  early part of February 2023.   Review of Systems Allergic rhinitis, GERD, and hypertension.    Objective:   Physical Exam Is a virtual visit.       Assessment & Plan: Sinus congestion.   Patient given prescription for Bactrim DS and fexofenadine.  Patient advised follow-up with scheduled ENT appointment.

## 2021-03-08 ENCOUNTER — Other Ambulatory Visit: Payer: Self-pay

## 2021-03-08 DIAGNOSIS — F419 Anxiety disorder, unspecified: Secondary | ICD-10-CM

## 2021-03-08 MED ORDER — SERTRALINE HCL 50 MG PO TABS: 150.0000 mg | ORAL_TABLET | Freq: Every day | ORAL | 0 refills | Status: DC

## 2021-03-08 NOTE — Telephone Encounter (Signed)
One month fill-interim provider- patient due for visit

## 2021-03-16 ENCOUNTER — Other Ambulatory Visit: Payer: Self-pay

## 2021-03-21 ENCOUNTER — Other Ambulatory Visit: Payer: Self-pay

## 2021-03-21 DIAGNOSIS — Z0000 Encounter for general adult medical examination without abnormal findings: Secondary | ICD-10-CM

## 2021-03-21 LAB — POCT URINALYSIS DIPSTICK
Bilirubin, UA: NEGATIVE
Blood, UA: NEGATIVE
Glucose, UA: POSITIVE — AB
Ketones, UA: NEGATIVE
Leukocytes, UA: NEGATIVE
Nitrite, UA: NEGATIVE
Protein, UA: NEGATIVE
Spec Grav, UA: 1.015 (ref 1.010–1.025)
Urobilinogen, UA: 0.2 E.U./dL
pH, UA: 6 (ref 5.0–8.0)

## 2021-03-22 LAB — CMP12+LP+TP+TSH+6AC+PSA+CBC…
ALT: 32 IU/L (ref 0–44)
AST: 26 IU/L (ref 0–40)
Albumin/Globulin Ratio: 1.9 (ref 1.2–2.2)
Albumin: 4.1 g/dL (ref 3.8–4.8)
Alkaline Phosphatase: 75 IU/L (ref 44–121)
BUN/Creatinine Ratio: 21 (ref 10–24)
BUN: 22 mg/dL (ref 8–27)
Basophils Absolute: 0.1 10*3/uL (ref 0.0–0.2)
Basos: 1 %
Bilirubin Total: 0.3 mg/dL (ref 0.0–1.2)
Calcium: 9.4 mg/dL (ref 8.6–10.2)
Chloride: 103 mmol/L (ref 96–106)
Chol/HDL Ratio: 4 ratio (ref 0.0–5.0)
Cholesterol, Total: 198 mg/dL (ref 100–199)
Creatinine, Ser: 1.03 mg/dL (ref 0.76–1.27)
EOS (ABSOLUTE): 0.1 10*3/uL (ref 0.0–0.4)
Eos: 2 %
Estimated CHD Risk: 0.8 times avg. (ref 0.0–1.0)
Free Thyroxine Index: 0.9 — ABNORMAL LOW (ref 1.2–4.9)
GGT: 28 IU/L (ref 0–65)
Globulin, Total: 2.2 g/dL (ref 1.5–4.5)
Glucose: 103 mg/dL — ABNORMAL HIGH (ref 70–99)
HDL: 49 mg/dL (ref >39)
Hematocrit: 46.9 % (ref 37.5–51.0)
Hemoglobin: 16.1 g/dL (ref 13.0–17.7)
Immature Grans (Abs): 0 10*3/uL (ref 0.0–0.1)
Immature Granulocytes: 0 %
Iron: 97 ug/dL (ref 38–169)
LDH: 197 IU/L (ref 121–224)
LDL Chol Calc (NIH): 129 mg/dL — ABNORMAL HIGH (ref 0–99)
Lymphocytes Absolute: 1.3 10*3/uL (ref 0.7–3.1)
Lymphs: 31 %
MCH: 31.4 pg (ref 26.6–33.0)
MCHC: 34.3 g/dL (ref 31.5–35.7)
MCV: 91 fL (ref 79–97)
Monocytes Absolute: 0.4 10*3/uL (ref 0.1–0.9)
Monocytes: 10 %
Neutrophils Absolute: 2.3 10*3/uL (ref 1.4–7.0)
Neutrophils: 56 %
Phosphorus: 3.7 mg/dL (ref 2.8–4.1)
Platelets: 207 10*3/uL (ref 150–450)
Potassium: 4.9 mmol/L (ref 3.5–5.2)
Prostate Specific Ag, Serum: 3.1 ng/mL (ref 0.0–4.0)
RBC: 5.13 x10E6/uL (ref 4.14–5.80)
RDW: 12.4 % (ref 11.6–15.4)
Sodium: 138 mmol/L (ref 134–144)
T3 Uptake Ratio: 20 % — ABNORMAL LOW (ref 24–39)
T4, Total: 4.7 ug/dL (ref 4.5–12.0)
TSH: 1.85 u[IU]/mL (ref 0.450–4.500)
Total Protein: 6.3 g/dL (ref 6.0–8.5)
Triglycerides: 113 mg/dL (ref 0–149)
Uric Acid: 4.2 mg/dL (ref 3.8–8.4)
VLDL Cholesterol Cal: 20 mg/dL (ref 5–40)
WBC: 4.2 10*3/uL (ref 3.4–10.8)
eGFR: 79 mL/min/{1.73_m2} (ref >59)

## 2021-03-22 LAB — MICROALBUMIN / CREATININE URINE RATIO
Creatinine, Urine: 123.2 mg/dL
Microalb/Creat Ratio: 13 mg/g creat (ref 0–29)
Microalbumin, Urine: 15.4 ug/mL

## 2021-03-22 LAB — HGB A1C W/O EAG: Hgb A1c MFr Bld: 6.4 % — ABNORMAL HIGH (ref 4.8–5.6)

## 2021-03-23 ENCOUNTER — Encounter: Payer: Self-pay | Admitting: Physician Assistant

## 2021-03-23 ENCOUNTER — Ambulatory Visit: Payer: Self-pay | Admitting: Physician Assistant

## 2021-03-23 ENCOUNTER — Other Ambulatory Visit: Payer: Self-pay

## 2021-03-23 VITALS — BP 100/70 | HR 67 | Temp 97.3°F | Resp 12 | Ht 75.0 in | Wt 211.0 lb

## 2021-03-23 DIAGNOSIS — Z0000 Encounter for general adult medical examination without abnormal findings: Secondary | ICD-10-CM

## 2021-03-23 NOTE — Progress Notes (Signed)
[facility]  ____________________________________________   None    (approximate)  I have reviewed the triage vital signs and the nursing notes.   HISTORY  Chief Complaint Annual Exam    HPI Gabriel Butler is a 71 y.o. male patient presents for annual physical exam.  Patient was concerned secondary to edema to the posterior right elbow.  Patient denies provoking incident for complaint.  Patient has history of cardiac artery disease and hypertension.  Patient is currently on 3 blood pressure medications.      Past Medical History:  Diagnosis Date   GERD (gastroesophageal reflux disease)    Heart attack ([place]) 03/05/2013   Coronary stent placement.   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   Chronic kidney disease 11/26/2019   Disorder of bursae of shoulder region 12/11/2018   Contusion of shoulder region 12/11/2018   Right inguinal hernia 03/20/2014   Essential hypertension 05/15/2013   Ischemic cardiomyopathy 03/14/2013   Pure hypercholesterolemia 03/14/2013   Coronary artery disease involving native coronary artery of native heart without angina pectoris 03/07/2013   Diabetes mellitus ([place]) 03/07/2013   GERD (gastroesophageal reflux disease) 03/07/2013   STEMI (ST elevation myocardial infarction) ([place]) 03/07/2013   cardiac cath 2013   Disorder of intestine 11/21/2010   History of colonic polyps 11/21/2010    Past Surgical History:  Procedure Laterality Date   COLONOSCOPY  2013   CORONARY ANGIOPLASTY WITH STENT PLACEMENT     HERNIA REPAIR Right 03/26/14   Direct and indirect hernia noted, large ultra Pro mesh utilized.    Prior to Admission medications   Medication Sig Start Date End Date Taking? Authorizing Provider  ALPRAZolam Duanne Moron) 0.5 MG tablet Take 1 tablet (0.5 mg total) by mouth 2 (two) times daily as needed for anxiety. 01/26/21   Yes Sable Feil, PA-C  aspirin 81 MG EC tablet Take 81 mg by mouth daily. Swallow whole.   Yes [provider]  atorvastatin (LIPITOR) 80 MG tablet TAKE ONE TABLET BY MOUTH AT BEDTIME 02/03/20  Yes Sable Feil, PA-C  clopidogrel (PLAVIX) 75 MG tablet Take 1 tablet by mouth daily. 11/03/18  Yes [provider]  empagliflozin (JARDIANCE) 10 MG TABS tablet  05/03/20  Yes [provider]  ezetimibe (ZETIA) 10 MG tablet Take 1 tablet by mouth daily. 11/03/18  Yes [provider]  fexofenadine (ALLEGRA) 60 MG tablet Take 1 tablet (60 mg total) by mouth 2 (two) times daily. 02/24/21  Yes Sable Feil, PA-C  ipratropium (ATROVENT) 0.06 % nasal spray PRN 04/10/17  Yes [provider]  lisinopril (ZESTRIL) 10 MG tablet Take 1 tablet (10 mg total) by mouth daily. 07/30/20  Yes Sable Feil, PA-C  metoprolol tartrate (LOPRESSOR) 25 MG tablet metoprolol tartrate 25 mg tablet  1 TABLET TWICE A DAY   Yes [provider]  sertraline (ZOLOFT) 50 MG tablet Take 3 tablets (150 mg total) by mouth daily. 03/08/21  Yes Jan Fireman, PA-C  spironolactone (ALDACTONE) 25 MG tablet Take 0.5 tablets (12.5 mg total) by mouth daily. 07/30/20  Yes Sable Feil, PA-C  traZODone (DESYREL) 50 MG tablet Take 1 to 2 tablets po hs prn for insomnia 10/20/19  Yes Sable Feil, PA-C  amoxicillin-clavulanate (AUGMENTIN) 875-125 MG tablet Take 1 tablet by mouth 2 (two) times daily. 12/27/20   Sable Feil,  PA-C  clotrimazole-betamethasone (LOTRISONE) cream APPLY TO AFFECTED AREAS DAILY AS DIRECTED Patient not taking: Reported on 03/23/2021 10/16/18   Towanda Malkin, MD  diclofenac sodium (VOLTAREN) 1 % GEL Apply 4 g topically 4 (four) times daily. Patient not taking: Reported on 03/23/2021 07/12/15   Hyatt, Max T, DPM  fexofenadine-pseudoephedrine (ALLEGRA-D) 60-120 MG 12 hr tablet Take 1 tablet by mouth 2 (two) times daily. 02/02/21   Sable Feil, PA-C   fluticasone (FLONASE) 50 MCG/ACT nasal spray Place 2 sprays into both nostrils daily. Patient not taking: Reported on 03/23/2021 12/23/19   Apolonio Schneiders, FNP  isosorbide mononitrate (IMDUR) 30 MG 24 hr tablet isosorbide mononitrate ER 30 mg tablet,extended release 24 hr  1 TABLET ONCE A DAY Patient not taking: Reported on 03/23/2021    [provider]  meloxicam (MOBIC) 15 MG tablet meloxicam 15 mg tablet Patient not taking: Reported on 03/23/2021    [provider]  methylPREDNISolone (MEDROL DOSEPAK) 4 MG TBPK tablet Take Tapered dose as directed 11/09/20   Sable Feil, PA-C  metoprolol succinate (TOPROL-XL) 50 MG 24 hr tablet TAKE ONE TABLET BY MOUTH TWICE DAILY Patient not taking: Reported on 03/23/2021 05/04/20   Sable Feil, PA-C  metroNIDAZOLE (METROGEL) 1 % gel APPLY TO AFFECTED AREAS EVERY DAY Patient not taking: Reported on 03/23/2021 08/25/19   Brendolyn Patty, MD  mupirocin ointment (BACTROBAN) 2 % Apply 1 application topically daily. Patient not taking: Reported on 03/23/2021 11/21/18   [provider]  nitroGLYCERIN (NITROSTAT) 0.4 MG SL tablet Take 1 tablet by mouth daily as needed. 04/27/17   [provider]  pantoprazole (PROTONIX) 40 MG tablet Take one tablet once to twice daily prn 10/20/19   Sable Feil, PA-C  sulfamethoxazole-trimethoprim (BACTRIM DS) 800-160 MG tablet Take 1 tablet by mouth 2 (two) times daily. 02/24/21   Sable Feil, PA-C  fexofenadine-pseudoephedrine (ALLEGRA-D) 60-120 MG 12 hr tablet Take 1 tablet by mouth 2 (two) times daily. 02/04/20   Sable Feil, PA-C  fexofenadine-pseudoephedrine (ALLEGRA-D) 60-120 MG 12 hr tablet Take 1 tablet by mouth 2 (two) times daily. 08/17/20   Sable Feil, PA-C    Allergies Bee pollen, Other, Pollen extract, and Pseudoephedrine-naproxen na er  Family History  Problem Relation Age of Onset   Lung cancer Father    Skin cancer Brother     Social History Social History    Tobacco Use   Smoking status: Never   Smokeless tobacco: Never  Substance Use Topics   Alcohol use: Yes    Alcohol/week: 0.0 standard drinks   Drug use: No    Review of Systems Constitutional: No fever/chills Eyes: No visual changes. ENT: No sore throat. Cardiovascular: Denies chest pain. Respiratory: Denies shortness of breath. Gastrointestinal: No abdominal pain.  No nausea, no vomiting.  No diarrhea.  No constipation. Genitourinary: Negative for dysuria. Musculoskeletal: Negative for back pain. Skin: Negative for rash.  Edema posterior right elbow. Neurological: Negative for headaches, focal weakness or numbness. Psychiatric: Anxiety Endocrine: Coronary artery disease, chronic kidney disease, diabetes, hypertension, and hyperlipidemia. Hematological/Lymphatic:  Allergic/Immunilogical: Bee pollen, Sudafed, and naproxen. ____________________________________________   PHYSICAL EXAM:  VITAL SIGNS: Temperature is 97.3, pulse 67, respiration 12, BP is 100/70, and patient 95% O2 sat on room air.  Patient weighs 211 pounds and BMI is 26.37.  Patient orthostatics reveals BP of 82/60 in the supine position and 98/60 in the standing position.  No vertigo. Constitutional: Alert and oriented. Well appearing and in no  acute distress. Eyes: Conjunctivae are normal. PERRL. EOMI. Head: Atraumatic. Nose: No congestion/rhinnorhea. Mouth/Throat: Mucous membranes are moist.  Oropharynx non-erythematous. Neck: No stridor.  No cervical spine tenderness to palpation. Hematological/Lymphatic/Immunilogical: No cervical lymphadenopathy. Cardiovascular: Normal rate, regular rhythm. Grossly normal heart sounds.  Good peripheral circulation. Respiratory: Normal respiratory effort.  No retractions. Lungs CTAB. Gastrointestinal: Soft and nontender. No distention. No abdominal bruits. No CVA tenderness. Genitourinary: Deferred Musculoskeletal: Edema posterior right elbow.  No lower extremity  tenderness nor edema.  No joint effusions. Neurologic:  Normal speech and language. No gross focal neurologic deficits are appreciated. No gait instability. Skin:  Skin is warm, dry and intact. No rash noted. Psychiatric: Mood and affect are normal. Speech and behavior are normal.  ____________________________________________   LABS       Component Ref Range & Units 2 d ago 1 yr ago 2 yr ago  Color, UA  Dark Yellow  yellow  yellow   Clarity, UA  Clear  clear  clear   Glucose, UA Negative Positive Abnormal   Positive Abnormal  CM  Negative   Comment: 3+  Bilirubin, UA  Negativ e  negative  negative   Ketones, UA  Negative  negative  negative   Spec Grav, UA 1.010 - 1.025 1.015  1.015  1.020   Blood, UA  Negative  negative  negative   pH, UA 5.0 - 8.0 6.0  6.0  6.0   Protein, UA Negative Negative  Negative  Negative   Urobilinogen, UA 0.2 or 1.0 E.U./dL 0.2  0.2  0.2   Nitrite, UA  Negative  negative  negative   Leukocytes, UA Negative Negative  Negative  Negative   Appearance   light     Odor               Specimen Collected: 03/21/21 09:19 Last Resulted: 03/21/21 09:19      Lab Flowsheet    Order Details    View Encounter    Lab and Collection Details    Routing    Result History    View Encounter Conversation      CM=Additional comments      Result Care Coordination   Patient Communication   Add Comments   Seen Back to Top       Other Results from 03/21/2021   Contains abnormal data CMP12+LP+TP+TSH+6AC+PSA+CBC Order: [id number] Status: Final result    Visible to patient: Yes (seen)    Next appt: 03/24/2021 at 10:15 AM in [facility] Sable Feil, PA-C)    Dx: Routine adult health maintenance    0 Result Notes             Component Ref Range & Units 2 d ago (03/21/21) 1 yr ago (03/19/20) 1 yr ago (08/20/19) 2 yr ago (03/10/19) 2 yr ago (02/25/19) 2 yr ago (04/10/18) 9 yr ago (09/03/11) 9 yr ago (09/03/11) 9 yr ago (09/03/11)  Glucose 70 - 99 mg/dL 103  High   109 High  R   113 High  R  CANCELED R, CM  112 High  R   104 High  R    Uric Acid 3.8 - 8.4 mg/dL 4.2  4.9 CM   4.9 CM  CANCELED R, CM  5.6 R, CM      Comment:            Therapeutic target for gout patients: <6.0  BUN 8 - 27 mg/dL 22  19  22  18   CANCELED  R, CM  17   20 High  R    Creatinine, Ser 0.76 - 1.27 mg/dL 1.03  1.14  1.46 High   1.18  CANCELED R, CM  0.99   1.16 R    eGFR >59 mL/min/1.73 79           BUN/Creatinine Ratio 10 - 24 21  17  15  15   17       Sodium 134 - 144 mmol/L 138  137   140  CANCELED R, CM  139   145 R    Potassium 3.5 - 5.2 mmol/L 4.9  4.6   5.0  CANCELED R, CM  4.6   4.2 R    Chloride 96 - 106 mmol/L 103  100   103  CANCELED R, CM  102   107 R    Calcium 8.6 - 10.2 mg/dL 9.4  9.6   9.3  CANCELED R, CM  9.7   8.9 R    Phosphorus 2.8 - 4.1 mg/dL 3.7  4.0   3.9  CANCELED R, CM  3.4      Total Protein 6.0 - 8.5 g/dL 6.3  6.9   6.4  CANCELED R, CM  6.5      Albumin 3.8 - 4.8 g/dL 4.1  4.6   4.2  CANCELED R, CM  4.3      Globulin, Total 1.5 - 4.5 g/dL 2.2  2.3   2.2   2.2      Albumin/Globulin Ratio 1.2 - 2.2 1.9  2.0   1.9   2.0      Bilirubin Total 0.0 - 1.2 mg/dL 0.3  0.5   0.4  CANCELED R, CM  0.5      Alkaline Phosphatase 44 - 121 IU/L 75  82   78 R  CANCELED R, CM  69 R      LDH 121 - 224 IU/L 197  219   245 High   CANCELED R, CM  190      AST 0 - 40 IU/L 26  28   30   CANCELED R, CM  26      ALT 0 - 44 IU/L 32  36   34  CANCELED R, CM  33      GGT 0 - 65 IU/L 28  26   21   CANCELED R, CM  22      Iron 38 - 169 ug/dL 97  105   80  CANCELED R, CM  90      Cholesterol, Total 100 - 199 mg/dL 198  109   98 Low   CANCELED R, CM  113      Triglycerides 0 - 149 mg/dL 113  64   55  CANCELED R, CM  71  95 R     HDL >39 mg/dL 49  49   39 Low   CANCELED R, CM  40  47 R     VLDL Cholesterol Cal 5 - 40 mg/dL 20  14   13   CANCELED R, CM  14      LDL Chol Calc (NIH) 0 - 99 mg/dL 129 High   46   46        Chol/HDL Ratio 0.0 - 5.0 ratio 4.0  2.2 CM   2.5 CM   2.8 CM       Comment:  T. Chol/HDL Ratio                                              Men  Women                                1/2 Avg.Risk  3.4    3.3                                    Avg.Risk  5.0    4.4                                 2X Avg.Risk  9.6    7.1                                 3X Avg.Risk 23.4   11.0   Estimated CHD Risk 0.0 - 1.0 times avg. 0.8   < 0.5 CM    < 0.5 CM    < 0.5 CM      Comment: The CHD Risk is based on the T. Chol/HDL ratio. Other  factors affect CHD Risk such as hypertension, smoking,  diabetes, severe obesity, and family history of  premature CHD.   TSH 0.450 - 4.500 uIU/mL 1.850  1.240   1.410  CANCELED R, CM  1.450      T4, Total 4.5 - 12.0 ug/dL 4.7  5.6   4.8  CANCELED R, CM  5.4      T3 Uptake Ratio 24 - 39 % 20 Low   22 Low    19 Low   CANCELED R, CM  21 Low       Free Thyroxine Index 1.2 - 4.9 0.9 Low   1.2   0.9 Low    1.1 Low       Prostate Specific Ag, Serum 0.0 - 4.0 ng/mL 3.1  2.9 CM   1.8 CM  CANCELED R, CM       Comment: Roche ECLIA methodology.  According to the American Urological Association, Serum PSA should  decrease and remain at undetectable levels after radical  prostatectomy. The AUA defines biochemical recurrence as an initial  PSA value 0.2 ng/mL or greater followed by a subsequent confirmatory  PSA value 0.2 ng/mL or greater.  Values obtained with different assay methods or kits cannot be used  interchangeably. Results cannot be interpreted as absolute evidence  of the presence or absence of malignant disease.   WBC 3.4 - 10.8 x10E3/uL 4.2  6.3   5.1  4.7  5.5    9.5 R   RBC 4.14 - 5.80 x10E6/uL 5.13  5.58   5.06  5.23  5.06    4.73 R   Hemoglobin 13.0 - 17.7 g/dL 16.1  17.4   15.8  16.0  15.3      Hematocrit 37.5 - 51.0 % 46.9  51.5 High    45.3  48.2  44.4      MCV 79 - 97 fL 91  92   90  92  88    92 R   MCH 26.6 -  33.0 pg 31.4  31.2   31.2  30.6  30.2    32.0 R   MCHC 31.5 - 35.7 g/dL 34.3  33.8    34.9  33.2  34.5    34.7 R   RDW 11.6 - 15.4 % 12.4  12.7   12.2  12.1  12.3    13.3 R   Platelets 150 - 450 x10E3/uL 207  236   208  199  204    193 R   Neutrophils Not Estab. % 56  69   59  54  61    77.3 R   Lymphs Not Estab. % 31  22   29   33  25      Monocytes Not Estab. % 10  7   9  10  11       Eos Not Estab. % 2  1   2  2  2       Basos Not Estab. % 1  1   1  1  1       Neutrophils Absolute 1.4 - 7.0 x10E3/uL 2.3  4.4   3.1  2.6  3.4    7.3 High  R   Lymphocytes Absolute 0.7 - 3.1 x10E3/uL 1.3  1.4   1.5  1.6  1.4    1.3 R   Monocytes Absolute 0.1 - 0.9 x10E3/uL 0.4  0.5   0.5  0.5  0.6      EOS (ABSOLUTE) 0.0 - 0.4 x10E3/uL 0.1  0.1   0.1  0.1  0.1      Basophils Absolute 0.0 - 0.2 x10E3/uL 0.1  0.0   0.1  0.0  0.0    0.0 R   Immature Granulocytes Not Estab. % 0  0   0  0  0      Immature Grans (Abs) 0.0 - 0.1 x10E3/uL 0.0  0.0   0.0  0.0  0.0      Resulting Agency  [facility]       Narrative Performed by: Maryan Puls Performed at:  [facility]  [address], [place], [place]  [id number]  Lab Director: Rush Farmer MD, Phone:  [phone]    Specimen Collected: 03/21/21 08:55 Last Resulted: 03/22/21 13:09      Lab Flowsheet    Order Details    View Encounter    Lab and Collection Details    Routing    Result History    View Encounter Conversation      CM=Additional comments  R=Reference range differs from displayed range      Result Care Coordination   Patient Communication   Add Comments   Seen Back to Top         Microalbumin / creatinine urine ratio Order: [id number] Status: Final result    Visible to patient: Yes (seen)    Next appt: 03/24/2021 at 10:15 AM in [facility] Sable Feil, PA-C)    0 Result Notes       Component Ref Range & Units 2 d ago 1 yr ago 2 yr ago  Creatinine, Urine Not Estab. mg/dL 123.2  69.4  93.8   Microalbumin, Urine  Not Estab. ug/mL 15.4  6.0  4.4   Microalb/Creat Ratio 0 - 29 mg/g creat 13  9 CM  5 CM   Comment:  Normal:                0 -  29                         Moderately increased: 30 - 300                         Severely increased:       >300   Resulting Agency  [facility]       Narrative Performed by: Maryan Puls Performed at:  [facility]  [address] [place], [place]  [id number]  Lab Director: Rush Farmer MD, Phone:  [phone]    Specimen Collected: 03/21/21 08:55 Last Resulted: 03/22/21 13:09      Lab Flowsheet    Order Details    View Encounter    Lab and Collection Details    Routing    Result History    View Encounter Conversation      CM=Additional comments      Result Care Coordination   Patient Communication   Add Comments   Seen Back to Top          Contains abnormal data Hgb A1c w/o eAG Order: [id number] Status: Final result    Visible to patient: Yes (seen)    Next appt: 03/24/2021 at 10:15 AM in [facility] Sable Feil, PA-C)    0 Result Notes   1 HM Topic          Component Ref Range & Units 2 d ago (03/21/21) 1 yr ago (03/19/20) 1 yr ago (08/20/19) 1 yr ago (05/19/19) 2 yr ago (02/25/19) 2 yr ago (04/10/18)              _________ ________________________  EKG  Sinus  Rhythm at 60 bpm. Low voltage in limb leads.   -Old anteroseptal infarct.   ABNORMAL  ____________________________________________   ____________________________________________   INITIAL IMPRESSION / ASSESSMENT AND PLAN / ED COURSE  As part of my medical decision making, I reviewed the following data within the [facility]       Discussed lab results and EKG with patient.  Patient right elbow is consistent with olecranon bursitis.  Patient is also hypotensive.  Patient will return back tomorrow for aspiration of bursa of the right elbow.  We will discuss medication adjustment for  hypotension.     ____________________________________________   FINAL CLINICAL IMPRESSION(S) / ED DIAGNOSES  Well exam   ED Discharge Orders     None        Note:  This document was prepared using Dragon voice recognition software and may include unintentional dictation errors.

## 2021-03-24 ENCOUNTER — Ambulatory Visit: Payer: Self-pay | Admitting: Physician Assistant

## 2021-03-24 ENCOUNTER — Encounter: Payer: Self-pay | Admitting: Physician Assistant

## 2021-03-24 VITALS — BP 100/70 | HR 59 | Resp 12

## 2021-03-24 DIAGNOSIS — I959 Hypotension, unspecified: Secondary | ICD-10-CM

## 2021-03-24 DIAGNOSIS — M7021 Olecranon bursitis, right elbow: Secondary | ICD-10-CM

## 2021-03-24 NOTE — Progress Notes (Signed)
[facility]  ____________________________________________   None    (approximate)  I have reviewed the triage vital signs and the nursing notes.   HISTORY  Chief Complaint Elbow Problem (Fluid) and Medication Management (Lisinopril/)    HPI Gabriel Butler is a 71 y.o. male patient presents with drainage of olecranon bursa of the right elbow.  Patient also here for evaluation of hypotension.  Noted on last exam patient blood pressure was low and he was tilt positive.  Patient denies vertigo or dyspnea.  Due to patient's cardiac history he is on 3 hypertension medications.         Past Medical History:  Diagnosis Date   GERD (gastroesophageal reflux disease)    Heart attack (HCC) 03/05/2013   Coronary stent placement.   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   Chronic kidney disease 11/26/2019   Disorder of bursae of shoulder region 12/11/2018   Contusion of shoulder region 12/11/2018   Right inguinal hernia 03/20/2014   Essential hypertension 05/15/2013   Ischemic cardiomyopathy 03/14/2013   Pure hypercholesterolemia 03/14/2013   Coronary artery disease involving native coronary artery of native heart without angina pectoris 03/07/2013   Diabetes mellitus (HCC) 03/07/2013   GERD (gastroesophageal reflux disease) 03/07/2013   STEMI (ST elevation myocardial infarction) (HCC) 03/07/2013   cardiac cath 2013   Disorder of intestine 11/21/2010   History of colonic polyps 11/21/2010    Past Surgical History:  Procedure Laterality Date   COLONOSCOPY  2013   CORONARY ANGIOPLASTY WITH STENT PLACEMENT     HERNIA REPAIR Right 03/26/14   Direct and indirect hernia noted, large ultra Pro mesh utilized.    Prior to Admission medications   Medication Sig Start Date End Date Taking? Authorizing Provider  ALPRAZolam Prudy Feeler) 0.5 MG tablet Take 1 tablet  (0.5 mg total) by mouth 2 (two) times daily as needed for anxiety. 01/26/21  Yes Joni Reining, PA-C  amoxicillin-clavulanate (AUGMENTIN) 875-125 MG tablet Take 1 tablet by mouth 2 (two) times daily. 12/27/20  Yes Joni Reining, PA-C  aspirin 81 MG EC tablet Take 81 mg by mouth daily. Swallow whole.   Yes [provider]  atorvastatin (LIPITOR) 80 MG tablet TAKE ONE TABLET BY MOUTH AT BEDTIME 02/03/20  Yes Joni Reining, PA-C  clopidogrel (PLAVIX) 75 MG tablet Take 1 tablet by mouth daily. 11/03/18  Yes [provider]  empagliflozin (JARDIANCE) 10 MG TABS tablet  05/03/20  Yes [provider]  ezetimibe (ZETIA) 10 MG tablet Take 1 tablet by mouth daily. 11/03/18  Yes [provider]  lisinopril (ZESTRIL) 10 MG tablet Take 1 tablet (10 mg total) by mouth daily. 07/30/20  Yes Joni Reining, PA-C  metoprolol tartrate (LOPRESSOR) 25 MG tablet metoprolol tartrate 25 mg tablet  1 TABLET TWICE A DAY   Yes [provider]  pantoprazole (PROTONIX) 40 MG tablet Take one tablet once to twice daily prn 10/20/19  Yes Joni Reining, PA-C  sertraline (ZOLOFT) 50 MG tablet Take 3 tablets (150 mg total) by mouth daily. 03/08/21  Yes Rae Halsted, PA-C  spironolactone (ALDACTONE) 25 MG tablet Take 0.5 tablets (12.5 mg total) by mouth daily. 07/30/20  Yes Joni Reining, PA-C  traZODone (DESYREL) 50 MG tablet Take 1 to 2 tablets po hs prn for insomnia 10/20/19  Yes Joni Reining, PA-C  clotrimazole-betamethasone (LOTRISONE) cream APPLY TO AFFECTED AREAS DAILY AS DIRECTED Patient not taking: Reported on 03/23/2021 10/16/18   Jamelle Haring, MD  diclofenac sodium (VOLTAREN) 1 % GEL Apply 4 g topically 4 (four) times daily. Patient not taking: Reported on 03/23/2021 07/12/15   Hyatt, Max T, DPM  fexofenadine (ALLEGRA) 60 MG tablet Take 1 tablet (60 mg total) by mouth 2 (two) times daily. Patient not taking: Reported on 03/24/2021 02/24/21   Joni Reining, PA-C   fexofenadine-pseudoephedrine (ALLEGRA-D) 60-120 MG 12 hr tablet Take 1 tablet by mouth 2 (two) times daily. 02/02/21   Joni Reining, PA-C  fluticasone (FLONASE) 50 MCG/ACT nasal spray Place 2 sprays into both nostrils daily. Patient not taking: Reported on 03/23/2021 12/23/19   Viviano Simas, FNP  ipratropium (ATROVENT) 0.06 % nasal spray PRN Patient not taking: Reported on 03/24/2021 04/10/17   [provider]  isosorbide mononitrate (IMDUR) 30 MG 24 hr tablet isosorbide mononitrate ER 30 mg tablet,extended release 24 hr  1 TABLET ONCE A DAY Patient not taking: Reported on 03/23/2021    [provider]  meloxicam (MOBIC) 15 MG tablet meloxicam 15 mg tablet Patient not taking: Reported on 03/23/2021    [provider]  methylPREDNISolone (MEDROL DOSEPAK) 4 MG TBPK tablet Take Tapered dose as directed 11/09/20   Joni Reining, PA-C  metoprolol succinate (TOPROL-XL) 50 MG 24 hr tablet TAKE ONE TABLET BY MOUTH TWICE DAILY Patient not taking: Reported on 03/23/2021 05/04/20   Joni Reining, PA-C  metroNIDAZOLE (METROGEL) 1 % gel APPLY TO AFFECTED AREAS EVERY DAY Patient not taking: Reported on 03/23/2021 08/25/19   Willeen Niece, MD  mupirocin ointment (BACTROBAN) 2 % Apply 1 application topically daily. Patient not taking: Reported on 03/23/2021 11/21/18   [provider]  nitroGLYCERIN (NITROSTAT) 0.4 MG SL tablet Take 1 tablet by mouth daily as needed. 04/27/17   [provider]  sulfamethoxazole-trimethoprim (BACTRIM DS) 800-160 MG tablet Take 1 tablet by mouth 2 (two) times daily. Patient not taking: Reported on 03/24/2021 02/24/21   Joni Reining, PA-C  fexofenadine-pseudoephedrine (ALLEGRA-D) 60-120 MG 12 hr tablet Take 1 tablet by mouth 2 (two) times daily. 02/04/20   Joni Reining, PA-C  fexofenadine-pseudoephedrine (ALLEGRA-D) 60-120 MG 12 hr tablet Take 1 tablet by mouth 2 (two) times daily. 08/17/20   Joni Reining, PA-C    Allergies Bee  pollen, Other, Pollen extract, and Pseudoephedrine-naproxen na er  Family History  Problem Relation Age of Onset   Lung cancer Father    Skin cancer Brother     Social History Social History   Tobacco Use   Smoking status: Never   Smokeless tobacco: Never  Substance Use Topics   Alcohol use: Yes    Alcohol/week: 0.0 standard drinks   Drug use: No    Review of Systems Constitutional: No fever/chills Eyes: No visual changes. ENT: No sore throat. Cardiovascular: Denies chest pain. Respiratory: Denies shortness of breath. Gastrointestinal: No abdominal pain.  No nausea, no vomiting.  No diarrhea.  No constipation. Genitourinary: Negative for dysuria. Musculoskeletal: Negative for back pain. Skin: Negative for rash.  Bursitis right elbow Neurological: Negative for headaches, focal weakness or numbness. Psychiatric: Anxiety  Endocrine: Diabetes, hyperlipidemia, hypertension. Allergic/Immunilogical: Bee pollen, Sudafed, and naproxen. ____________________________________________   PHYSICAL EXAM:  VITAL SIGNS: Temperature 97.8, pulse 59, respiration 12, BP is 100/70. Constitutional: Alert and oriented. Well appearing and in no acute distress. Eyes: Conjunctivae are normal. PERRL. EOMI. Head: Atraumatic. Nose: No congestion/rhinnorhea.  Mouth/Throat: Mucous membranes are moist.  Oropharynx non-erythematous. Neck: No stridor.  No cervical spine tenderness to palpation. Hematological/Lymphatic/Immunilogical: No cervical lymphadenopathy. Cardiovascular: Normal rate, regular rhythm. Grossly normal heart sounds.  Good peripheral circulation. Respiratory: Normal respiratory effort.  No retractions. Lungs CTAB. Gastrointestinal: Soft and nontender. No distention. No abdominal bruits. No CVA tenderness. Genitourinary: Deferred Musculoskeletal: No lower extremity tenderness nor edema.  No joint effusions. Neurologic:  Normal speech and language. No gross focal neurologic deficits are  appreciated. No gait instability. Skin:  Skin is warm, dry and intact.  Fluid-filled lesion posterior right elbow.. Psychiatric: Mood and affect are normal. Speech and behavior are normal.  ____________________________________________   ____________________________________________  EKG   ____________________________________________    ____________________________________________   INITIAL IMPRESSION / ASSESSMENT AND PLAN   As part of my medical decision making, I reviewed the following data within the electronic MEDICAL RECORD NUMBER      Obtain patient consent for drainage of the olecranon bursa right elbow.  Area was surgically clean and anesthesia was was obtained using 1 cc of lidocaine with epinephrine.  15 mL of fluid was aspirated.  Areas reclean and compression dressing applied.  Patient given follow-up care instructions.  Discussed blood pressure control and a trial of decreasing lisinopril from 10 mg to 5 mg and follow-up in 4 days.  Continue previous doses of hypertension medications.    ____________________________________________   FINAL CLINICAL IMPRESSION(S) / ED DIAGNOSES     ED Discharge Orders     None        Note:  This document was prepared using Dragon voice recognition software and may include unintentional dictation errors.

## 2021-03-28 ENCOUNTER — Ambulatory Visit: Payer: Self-pay | Admitting: Physician Assistant

## 2021-03-28 ENCOUNTER — Other Ambulatory Visit: Payer: Self-pay

## 2021-03-28 ENCOUNTER — Encounter: Payer: Self-pay | Admitting: Physician Assistant

## 2021-03-28 VITALS — BP 120/89 | HR 64 | Temp 97.6°F | Resp 14 | Ht 75.0 in | Wt 210.0 lb

## 2021-03-28 DIAGNOSIS — M7021 Olecranon bursitis, right elbow: Secondary | ICD-10-CM

## 2021-03-28 NOTE — Progress Notes (Signed)
Pt presents today for f/u for fluid in right elbow. Pt has more fluid built up.

## 2021-03-28 NOTE — Progress Notes (Signed)
[facility] ___________________________________________   None    (approximate)  I have reviewed the triage vital signs and the nursing notes.   HISTORY  Chief Complaint No chief complaint on file.   HPI Gabriel Butler is a 71 y.o. male patient returns for aspiration posterior right elbow.  Patient was seen 4 days ago for olecranon bursitis which was aspirated.  At that time approximately 25 cc of fluid was aspirated.  Patient returns with recurrent edema which is smaller in size than previous incident.  Denies pain, loss of sensation, or function.  Patient is right-hand dominant.         Past Medical History:  Diagnosis Date   GERD (gastroesophageal reflux disease)    Heart attack ([place]) 03/05/2013   Coronary stent placement.   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   Chronic kidney disease 11/26/2019   Disorder of bursae of shoulder region 12/11/2018   Contusion of shoulder region 12/11/2018   Right inguinal hernia 03/20/2014   Essential hypertension 05/15/2013   Ischemic cardiomyopathy 03/14/2013   Pure hypercholesterolemia 03/14/2013   Coronary artery disease involving native coronary artery of native heart without angina pectoris 03/07/2013   Diabetes mellitus ([place]) 03/07/2013   GERD (gastroesophageal reflux disease) 03/07/2013   STEMI (ST elevation myocardial infarction) ([place]) 03/07/2013   cardiac cath 2013   Disorder of intestine 11/21/2010   History of colonic polyps 11/21/2010    Past Surgical History:  Procedure Laterality Date   COLONOSCOPY  2013   CORONARY ANGIOPLASTY WITH STENT PLACEMENT     HERNIA REPAIR Right 03/26/14   Direct and indirect hernia noted, large ultra Pro mesh utilized.    Prior to Admission medications   Medication Sig Start Date End Date Taking? Authorizing Provider  ALPRAZolam Duanne Moron) 0.5 MG tablet Take 1  tablet (0.5 mg total) by mouth 2 (two) times daily as needed for anxiety. 01/26/21  Yes Sable Feil, PA-C  amoxicillin-clavulanate (AUGMENTIN) 875-125 MG tablet Take 1 tablet by mouth 2 (two) times daily. 12/27/20  Yes Sable Feil, PA-C  aspirin 81 MG EC tablet Take 81 mg by mouth daily. Swallow whole.   Yes [provider]  atorvastatin (LIPITOR) 80 MG tablet TAKE ONE TABLET BY MOUTH AT BEDTIME 02/03/20  Yes Sable Feil, PA-C  clopidogrel (PLAVIX) 75 MG tablet Take 1 tablet by mouth daily. 11/03/18  Yes [provider]  clotrimazole-betamethasone (LOTRISONE) cream APPLY TO AFFECTED AREAS DAILY AS DIRECTED 10/16/18  Yes Lebron Conners D, MD  diclofenac sodium (VOLTAREN) 1 % GEL Apply 4 g topically 4 (four) times daily. 07/12/15  Yes Hyatt, Max T, DPM  empagliflozin (JARDIANCE) 10 MG TABS tablet  05/03/20  Yes [provider]  ezetimibe (ZETIA) 10 MG tablet Take 1 tablet by mouth daily. 11/03/18  Yes [provider]  fexofenadine (ALLEGRA) 60 MG tablet Take 1 tablet (60 mg total) by mouth 2 (two) times daily. 02/24/21  Yes Sable Feil, PA-C  fexofenadine-pseudoephedrine (ALLEGRA-D) 60-120 MG 12 hr tablet Take 1 tablet by mouth 2 (two) times daily. 02/02/21  Yes Sable Feil, PA-C  fluticasone [facility]) 50 MCG/ACT nasal spray Place 2 sprays into both nostrils daily. 12/23/19  Yes Apolonio Schneiders, FNP  ipratropium (ATROVENT) 0.06 % nasal spray  04/10/17  Yes [provider]  isosorbide mononitrate (IMDUR) 30 MG 24 hr tablet  Yes [provider]  lisinopril (ZESTRIL) 10 MG tablet Take 1 tablet (10 mg total) by mouth daily. 07/30/20  Yes Sable Feil, PA-C  meloxicam (MOBIC) 15 MG tablet    Yes [provider]  metoprolol succinate (TOPROL-XL) 50 MG 24 hr tablet TAKE ONE TABLET BY MOUTH TWICE DAILY 05/04/20  Yes Sable Feil, PA-C  metoprolol tartrate (LOPRESSOR) 25 MG tablet metoprolol tartrate 25 mg tablet  1 TABLET  TWICE A DAY   Yes [provider]  metroNIDAZOLE (METROGEL) 1 % gel APPLY TO AFFECTED AREAS EVERY DAY 08/25/19  Yes Brendolyn Patty, MD  mupirocin ointment (BACTROBAN) 2 % Apply 1 application topically daily. 11/21/18  Yes [provider]  nitroGLYCERIN (NITROSTAT) 0.4 MG SL tablet Take 1 tablet by mouth daily as needed. 04/27/17  Yes [provider]  pantoprazole (PROTONIX) 40 MG tablet Take one tablet once to twice daily prn 10/20/19  Yes Sable Feil, PA-C  sertraline (ZOLOFT) 50 MG tablet Take 3 tablets (150 mg total) by mouth daily. 03/08/21  Yes Jan Fireman, PA-C  spironolactone (ALDACTONE) 25 MG tablet Take 0.5 tablets (12.5 mg total) by mouth daily. 07/30/20  Yes Sable Feil, PA-C  sulfamethoxazole-trimethoprim (BACTRIM DS) 800-160 MG tablet Take 1 tablet by mouth 2 (two) times daily. 02/24/21  Yes Sable Feil, PA-C  traZODone (DESYREL) 50 MG tablet Take 1 to 2 tablets po hs prn for insomnia 10/20/19  Yes Sable Feil, PA-C  fexofenadine-pseudoephedrine (ALLEGRA-D) 60-120 MG 12 hr tablet Take 1 tablet by mouth 2 (two) times daily. 02/04/20   Sable Feil, PA-C  fexofenadine-pseudoephedrine (ALLEGRA-D) 60-120 MG 12 hr tablet Take 1 tablet by mouth 2 (two) times daily. 08/17/20   Sable Feil, PA-C    Allergies Bee pollen, Other, Pollen extract, and Pseudoephedrine-naproxen na er  Family History  Problem Relation Age of Onset   Lung cancer Father    Skin cancer Brother     Social History Social History   Tobacco Use   Smoking status: Never   Smokeless tobacco: Never  Substance Use Topics   Alcohol use: Yes    Alcohol/week: 0.0 standard drinks   Drug use: No    Review of Systems Constitutional: No fever/chills Eyes: No visual changes. ENT: No sore throat. Cardiovascular: Denies chest pain. Respiratory: Denies shortness of breath. Gastrointestinal: No abdominal pain.  No nausea, no vomiting.  No diarrhea.  No  constipation. Genitourinary: Negative for dysuria. Musculoskeletal: Negative for back pain. Skin: Negative for rash. Neurological: Negative for headaches, focal weakness or numbness. Psychiatric: Anxiety Endocrine: GERD, hyperlipidemia, hypertension. Allergic/Immunilogical: Bee pollen, naproxen, and Sudafed. ____________________________________________   PHYSICAL EXAM:  VITAL SIGNS: Temperature 97.6, pulse 64, respiration 14, BP is 120/89, and patient 96% O2 sat on room air.  Patient weighs 210 pounds and BMI is 26.25. Constitutional: Alert and oriented. Well appearing and in no acute distress. Cardiovascular: Normal rate, regular rhythm. Grossly normal heart sounds.  Good peripheral circulation. Respiratory: Normal respiratory effort.  No retractions. Lungs CTAB. Gastrointestinal: Soft and nontender. No distention. No abdominal bruits. No CVA tenderness. Genitourinary: Deferred Musculoskeletal: No lower extremity tenderness nor edema.  No joint effusions. Neurologic:  Normal speech and language. No gross focal neurologic deficits are appreciated. No gait instability. Skin:  Skin is warm, dry and intact. No rash noted.  Edema posterior right elbow. Psychiatric: Mood and affect are normal. Speech and behavior are normal.  ____________________________________________    ____________________________________________     ____________________________________________  ____________________________________________   PROCEDURES  Procedure(s) performed (including Critical Care):  Procedures Obtain informed consent.  Local block consisting of 1% epinephrine and lidocaine.  Under sterile conditions aspirated 10 cc of fluid.  Pressure dressing applied and patient was sent to get x-ray of the right elbow. ____________________________________________   INITIAL IMPRESSION / ASSESSMENT AND PLAN   As part of my medical decision making, I reviewed the following data within the [facility]           Right olecranon bursitis was aspirated.  Patient given discharge care instructions.  Patient will obtain x-ray before next appointment.  Patient will follow-up in 2 days. ____________________________________________   FINAL CLINICAL IMPRESSION(S) Right olecranon bursitis  ED Discharge Orders          Ordered    DG ELBOW COMPLETE RIGHT (3+VIEW)        03/28/21 1016             Note:  This document was prepared using Dragon voice recognition software and may include unintentional dictation errors.

## 2021-03-29 ENCOUNTER — Ambulatory Visit
Admission: RE | Admit: 2021-03-29 | Discharge: 2021-03-29 | Disposition: A | Discharge status: Home / Self Care | Payer: 59 | Source: Ambulatory Visit | Attending: Physician Assistant | Admitting: Physician Assistant

## 2021-03-29 DIAGNOSIS — M7021 Olecranon bursitis, right elbow: Secondary | ICD-10-CM

## 2021-03-30 ENCOUNTER — Ambulatory Visit: Payer: Self-pay | Admitting: Physician Assistant

## 2021-03-30 ENCOUNTER — Other Ambulatory Visit: Payer: Self-pay

## 2021-03-30 ENCOUNTER — Encounter: Payer: Self-pay | Admitting: Physician Assistant

## 2021-03-30 VITALS — BP 100/70 | HR 62 | Temp 96.9°F | Resp 12

## 2021-03-30 DIAGNOSIS — M7021 Olecranon bursitis, right elbow: Secondary | ICD-10-CM

## 2021-03-30 NOTE — Progress Notes (Signed)
° °  Subjective: Olecranon bursitis    Patient ID: Gabriel Butler, male    DOB: [birth year], 71 y.o.   MRN: [id number]  HPI Patient follow-up status post second aspiration of necrotic ascites.  Patient again denies loss sensation or loss of function.  Patient had x-rays of the right elbow yesterday which revealed soft tissue prominence about the olecranon process.  No osseous or abnormalities.   Review of Systems Diabetes, hyperlipidemia, hypertension.    Objective:   Physical Exam No acute distress.  Temperature is 96.9, pulse 62, respiration 12, BP is 100/70, and patient 97% O2 sat on room air. Patient has reduced edema to the posterior right elbow.       Assessment & Plan: Right olecranon bursitis  Discussed x-ray findings with patient.  Patient amenable to trial of NSAIDs and mild compression wraps.  Patient vies follow-up with 1 week if no improvement or worsening complaint.

## 2021-04-05 ENCOUNTER — Other Ambulatory Visit: Payer: Self-pay

## 2021-04-05 DIAGNOSIS — F419 Anxiety disorder, unspecified: Secondary | ICD-10-CM

## 2021-04-05 MED ORDER — SERTRALINE HCL 50 MG PO TABS: 150.0000 mg | ORAL_TABLET | Freq: Every day | ORAL | 3 refills | Status: DC

## 2021-04-14 DIAGNOSIS — M7021 Olecranon bursitis, right elbow: Secondary | ICD-10-CM | POA: Diagnosis not present

## 2021-04-14 DIAGNOSIS — J309 Allergic rhinitis, unspecified: Secondary | ICD-10-CM | POA: Diagnosis not present

## 2021-04-18 ENCOUNTER — Other Ambulatory Visit: Payer: Self-pay | Admitting: Physician Assistant

## 2021-04-18 DIAGNOSIS — F419 Anxiety disorder, unspecified: Secondary | ICD-10-CM

## 2021-04-21 ENCOUNTER — Other Ambulatory Visit: Payer: Self-pay | Admitting: Physician Assistant

## 2021-04-21 ENCOUNTER — Other Ambulatory Visit: Payer: Self-pay

## 2021-04-21 MED ORDER — SERTRALINE HCL 50 MG PO TABS: 150.0000 mg | ORAL_TABLET | Freq: Every day | ORAL | 3 refills | Status: DC

## 2021-04-25 ENCOUNTER — Other Ambulatory Visit: Payer: Self-pay

## 2021-04-28 IMAGING — CR DG LUMBAR SPINE 2-3V
1 series · 3 of 3 positions shown · non-contrast
Comparison: None.

CLINICAL DATA: Low back pain

EXAM:
LUMBAR SPINE - 2-3 VIEW

[Series 1: dg lumbar spine 2-3 views · 0.14mm/px · 3 of 3 slices shown]
[im 1/3]
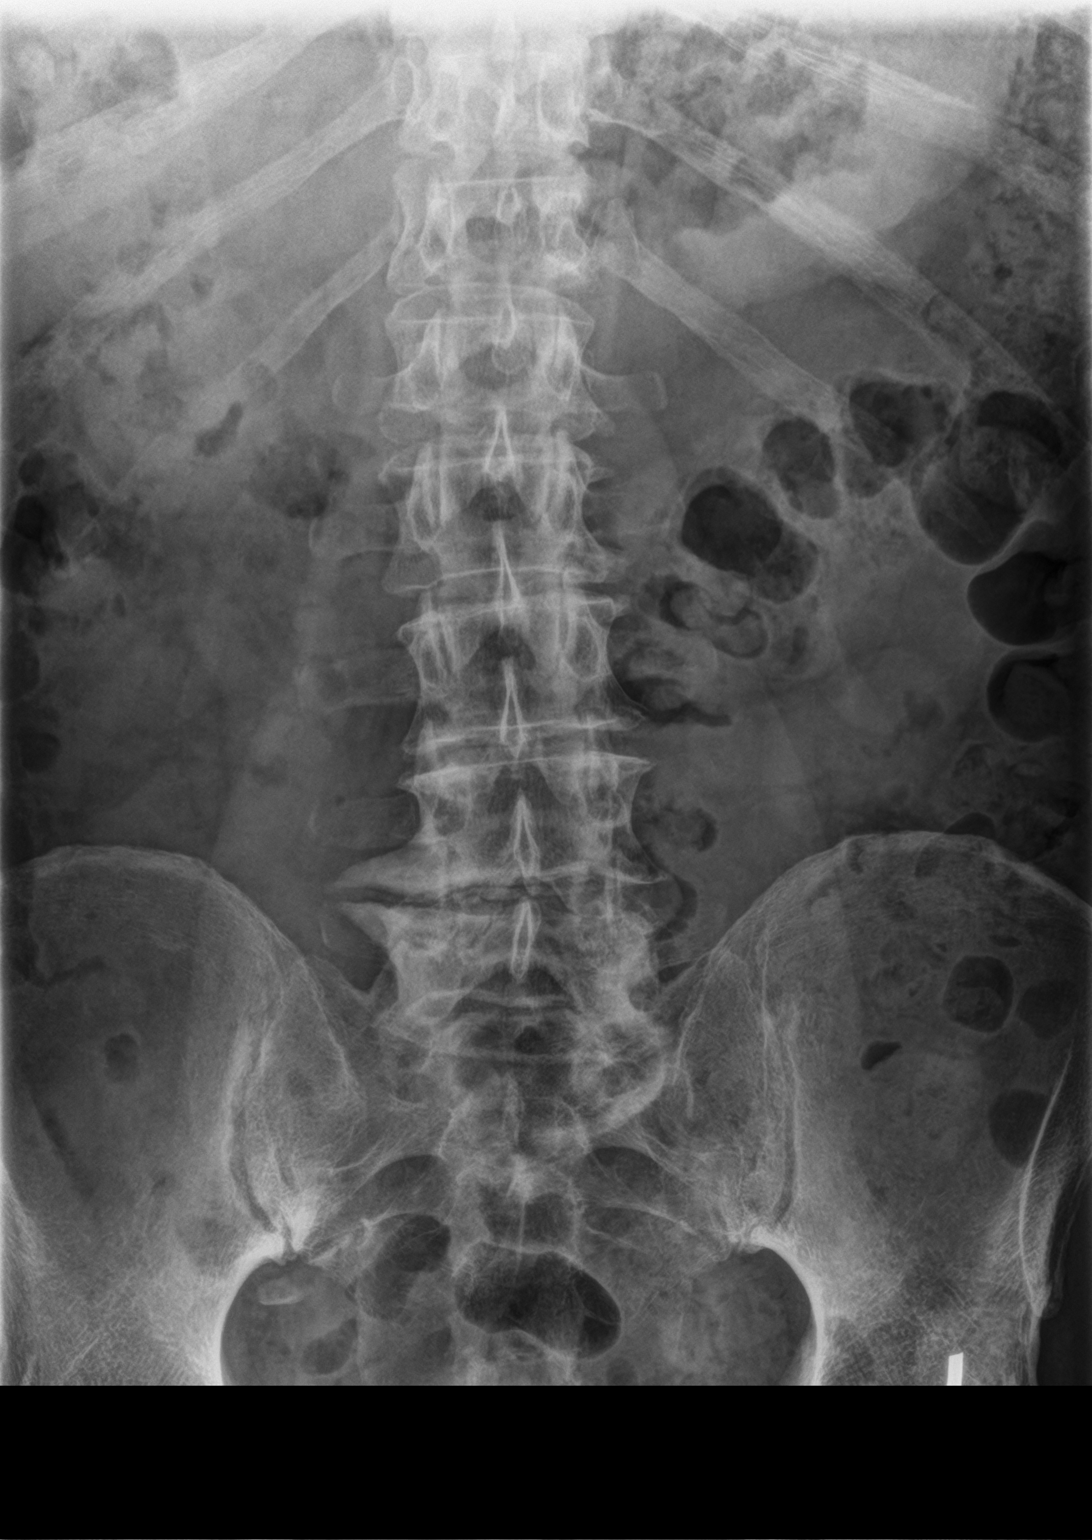
[im 2/3]
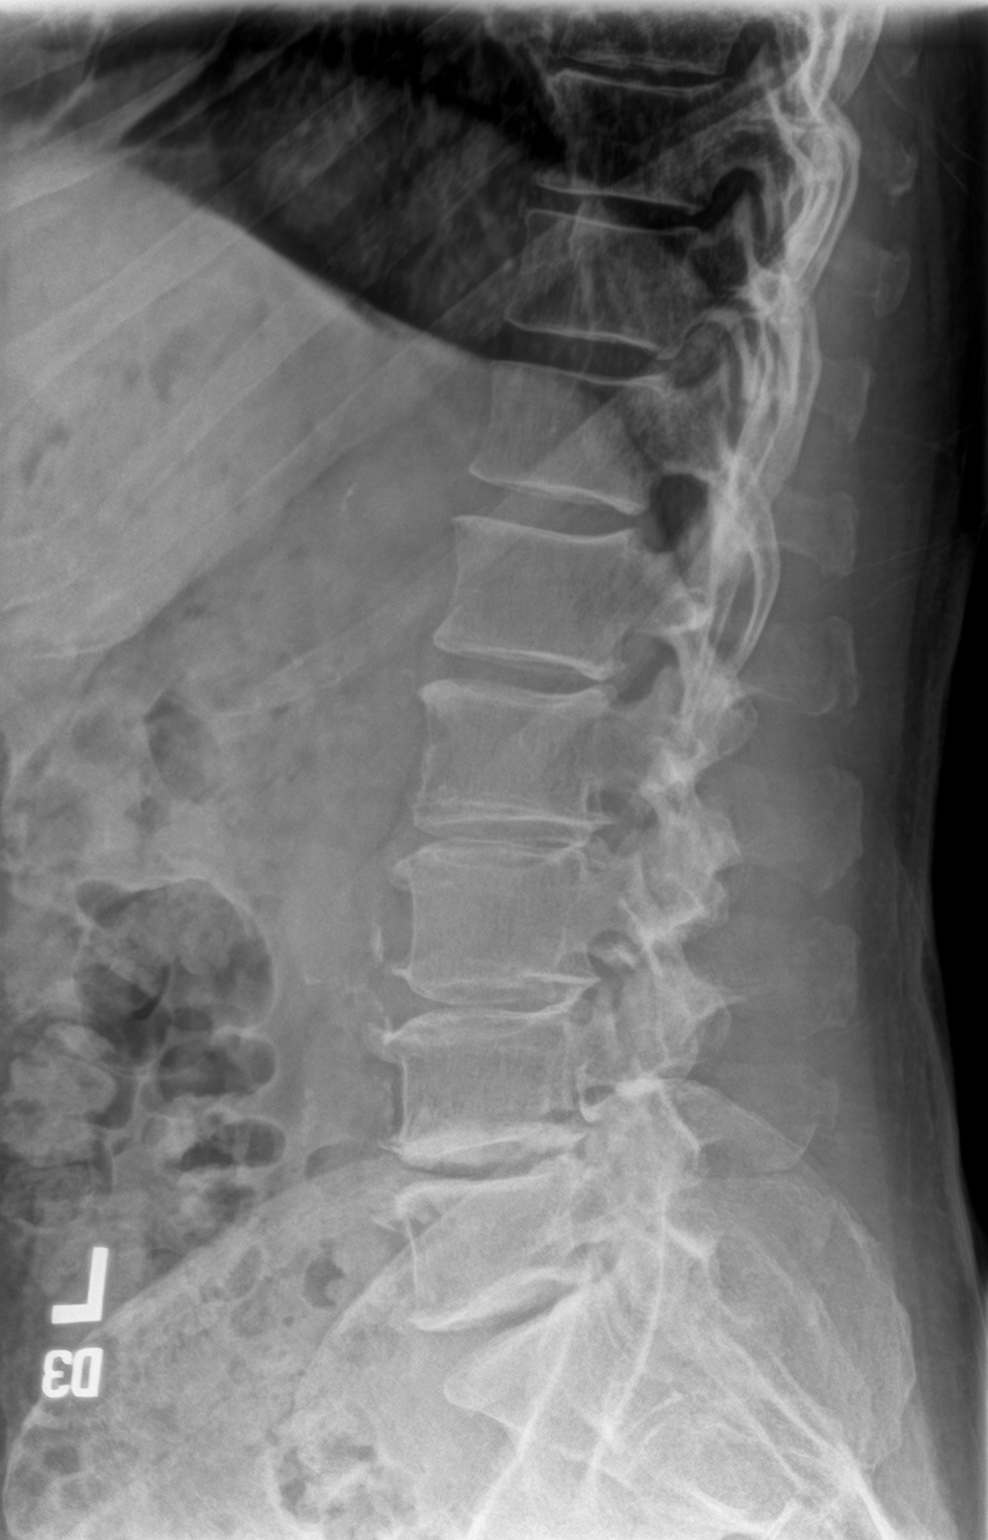
[im 3/3]
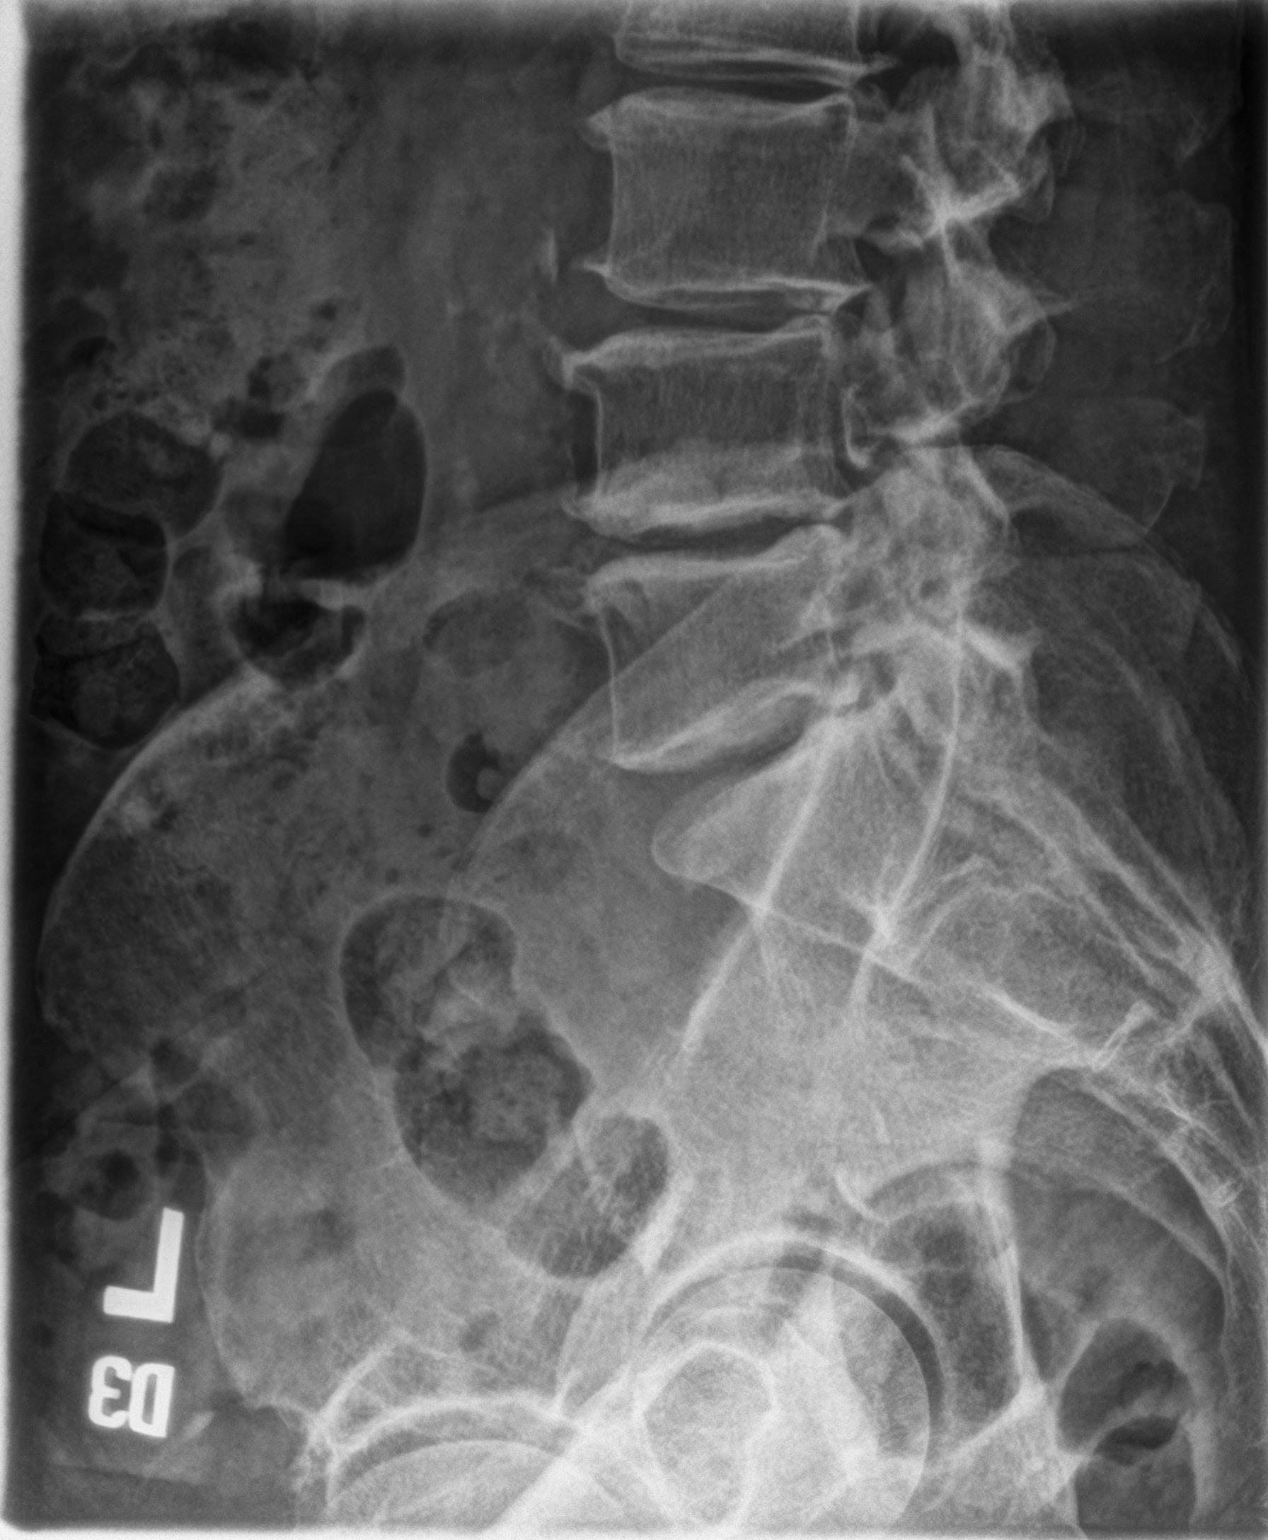

[3 of 3 positions shown; findings below may reference images not displayed]

FINDINGS: Five lumbar type vertebral bodies. Mild retrolisthesis at L3-L4.
Lumbar vertebral body heights are maintained apart from degenerative
endplate irregularity at lower levels. Diffuse disc space narrowing,
endplate osteophytes, and facet hypertrophy, greatest at L4-L5 and
L5-S1. No significant pelvic tilt.
IMPRESSION: Multilevel degenerative changes of the lumbar spine, greatest at
L4-L5 and L5-S1.

## 2021-05-03 DIAGNOSIS — M7021 Olecranon bursitis, right elbow: Secondary | ICD-10-CM | POA: Diagnosis not present

## 2021-05-20 DIAGNOSIS — I255 Ischemic cardiomyopathy: Secondary | ICD-10-CM | POA: Diagnosis not present

## 2021-05-20 DIAGNOSIS — E7800 Pure hypercholesterolemia, unspecified: Secondary | ICD-10-CM | POA: Diagnosis not present

## 2021-05-20 DIAGNOSIS — I2510 Atherosclerotic heart disease of native coronary artery without angina pectoris: Secondary | ICD-10-CM | POA: Diagnosis not present

## 2021-05-20 DIAGNOSIS — I10 Essential (primary) hypertension: Secondary | ICD-10-CM | POA: Diagnosis not present

## 2021-06-03 ENCOUNTER — Other Ambulatory Visit: Payer: Self-pay | Admitting: Physician Assistant

## 2021-06-03 DIAGNOSIS — I10 Essential (primary) hypertension: Secondary | ICD-10-CM

## 2021-06-06 DIAGNOSIS — I10 Essential (primary) hypertension: Secondary | ICD-10-CM | POA: Diagnosis not present

## 2021-06-06 DIAGNOSIS — E1122 Type 2 diabetes mellitus with diabetic chronic kidney disease: Secondary | ICD-10-CM | POA: Diagnosis not present

## 2021-06-06 DIAGNOSIS — N182 Chronic kidney disease, stage 2 (mild): Secondary | ICD-10-CM | POA: Diagnosis not present

## 2021-06-22 ENCOUNTER — Encounter: Payer: Self-pay | Admitting: Physician Assistant

## 2021-06-22 ENCOUNTER — Ambulatory Visit: Payer: Self-pay | Admitting: Physician Assistant

## 2021-06-22 VITALS — BP 113/80 | HR 72 | Temp 97.8°F | Resp 14 | Ht 75.0 in | Wt 210.0 lb

## 2021-06-22 DIAGNOSIS — J301 Allergic rhinitis due to pollen: Secondary | ICD-10-CM

## 2021-06-22 DIAGNOSIS — Z1152 Encounter for screening for COVID-19: Secondary | ICD-10-CM

## 2021-06-22 DIAGNOSIS — J320 Chronic maxillary sinusitis: Secondary | ICD-10-CM

## 2021-06-22 LAB — POC COVID19 BINAXNOW: SARS Coronavirus 2 Ag: NEGATIVE

## 2021-06-22 MED ORDER — METHYLPREDNISOLONE SODIUM SUCC 40 MG IJ SOLR: 40.0000 mg | Freq: Once | INTRAMUSCULAR | Status: DC

## 2021-06-22 MED ORDER — FLUTICASONE PROPIONATE 50 MCG/ACT NA SUSP: 2.0000 | Freq: Every day | NASAL | 6 refills | Status: DC

## 2021-06-22 MED ORDER — FEXOFENADINE HCL 60 MG PO TABS: 60.0000 mg | ORAL_TABLET | Freq: Two times a day (BID) | ORAL | 0 refills | Status: DC

## 2021-06-22 MED ORDER — SULFAMETHOXAZOLE-TRIMETHOPRIM 800-160 MG PO TABS: 1.0000 | ORAL_TABLET | Freq: Two times a day (BID) | ORAL | 0 refills | Status: DC

## 2021-06-22 NOTE — Progress Notes (Signed)
? ?  Subjective: Sinus congestion  ? ? Patient ID: Gabriel Butler, male    DOB: 03/23/1950, 71 y.o.   MRN: [id number] ? ?HPI ?Patient presents for intermittent sinus congestion and rhinorrhea for 2-1/2 weeks.  Patient has a history of allergic rhinitis which has not improved with over-the-counter medications.  Patient denies fever chills associated with complaint.  Patient states intermitting cough secondary to postnasal drainage. ? ? ?Review of Systems ?Diabetes, GERD, and hypertension. ?   ?Objective:  ? Physical Exam ? ?BP is 113/80, pulse 72, respiration 14, temperature 97.8, and patient 95% O2 sat on room air.  Patient weighs 210 pounds and BMI is 26.25. ?HEENT is remarkable edematous nasal turbinates bilateral maxillary guarding.  Postnasal drainage is apparent.  Neck is supple without lymphadenopathy or bruits.  Lungs are clear to auscultation.  Heart is regular rate and rhythm. ? ? ?   ?Assessment & Plan: Subacute maxillary sinusitis  ?Patient complains consistent with subacute maxillary cellulitis secondary to underlying causes seasonal rhinitis.  Patient given Solu-Medrol 40 mg IM.  Patient given a prescription for Flonase , Allegra, and Bactrim DS.  Patient vies follow-up in 1 week if no improvement or worsening complaints. ? ?

## 2021-06-22 NOTE — Progress Notes (Signed)
Runny nose, coughing and congestion for 10 days. ?

## 2021-06-23 IMAGING — CR DG CHEST 2V
1 series · 2 of 2 positions shown · non-contrast
Comparison: PA and lateral chest 09/02/2011.

CLINICAL DATA: Question emphysema.  No current chest complaints.

EXAM:
CHEST - 2 VIEW

[Series 1: dg chest 2 view · 0.14mm/px · 2 of 2 slices shown]
[im 1/2]
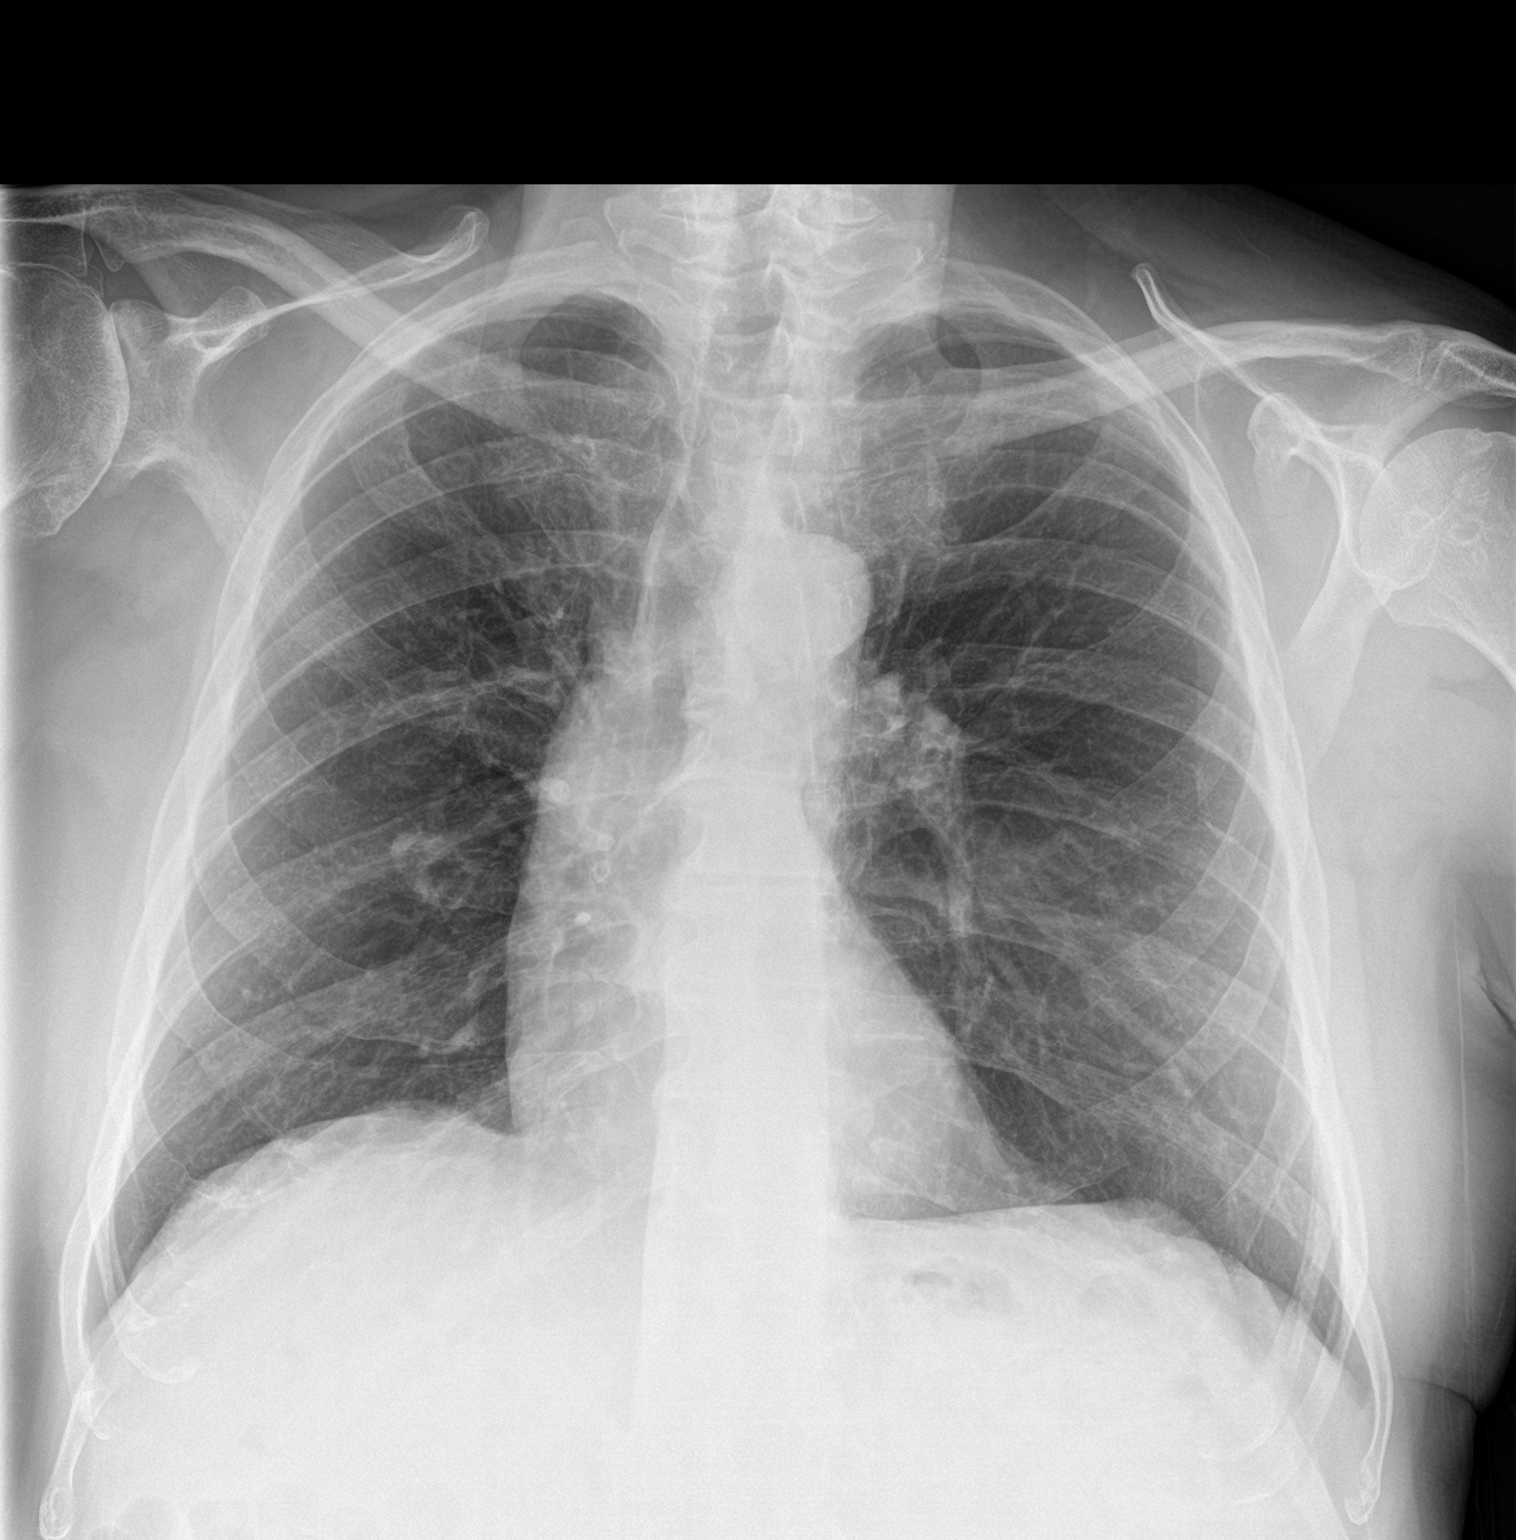
[im 2/2]
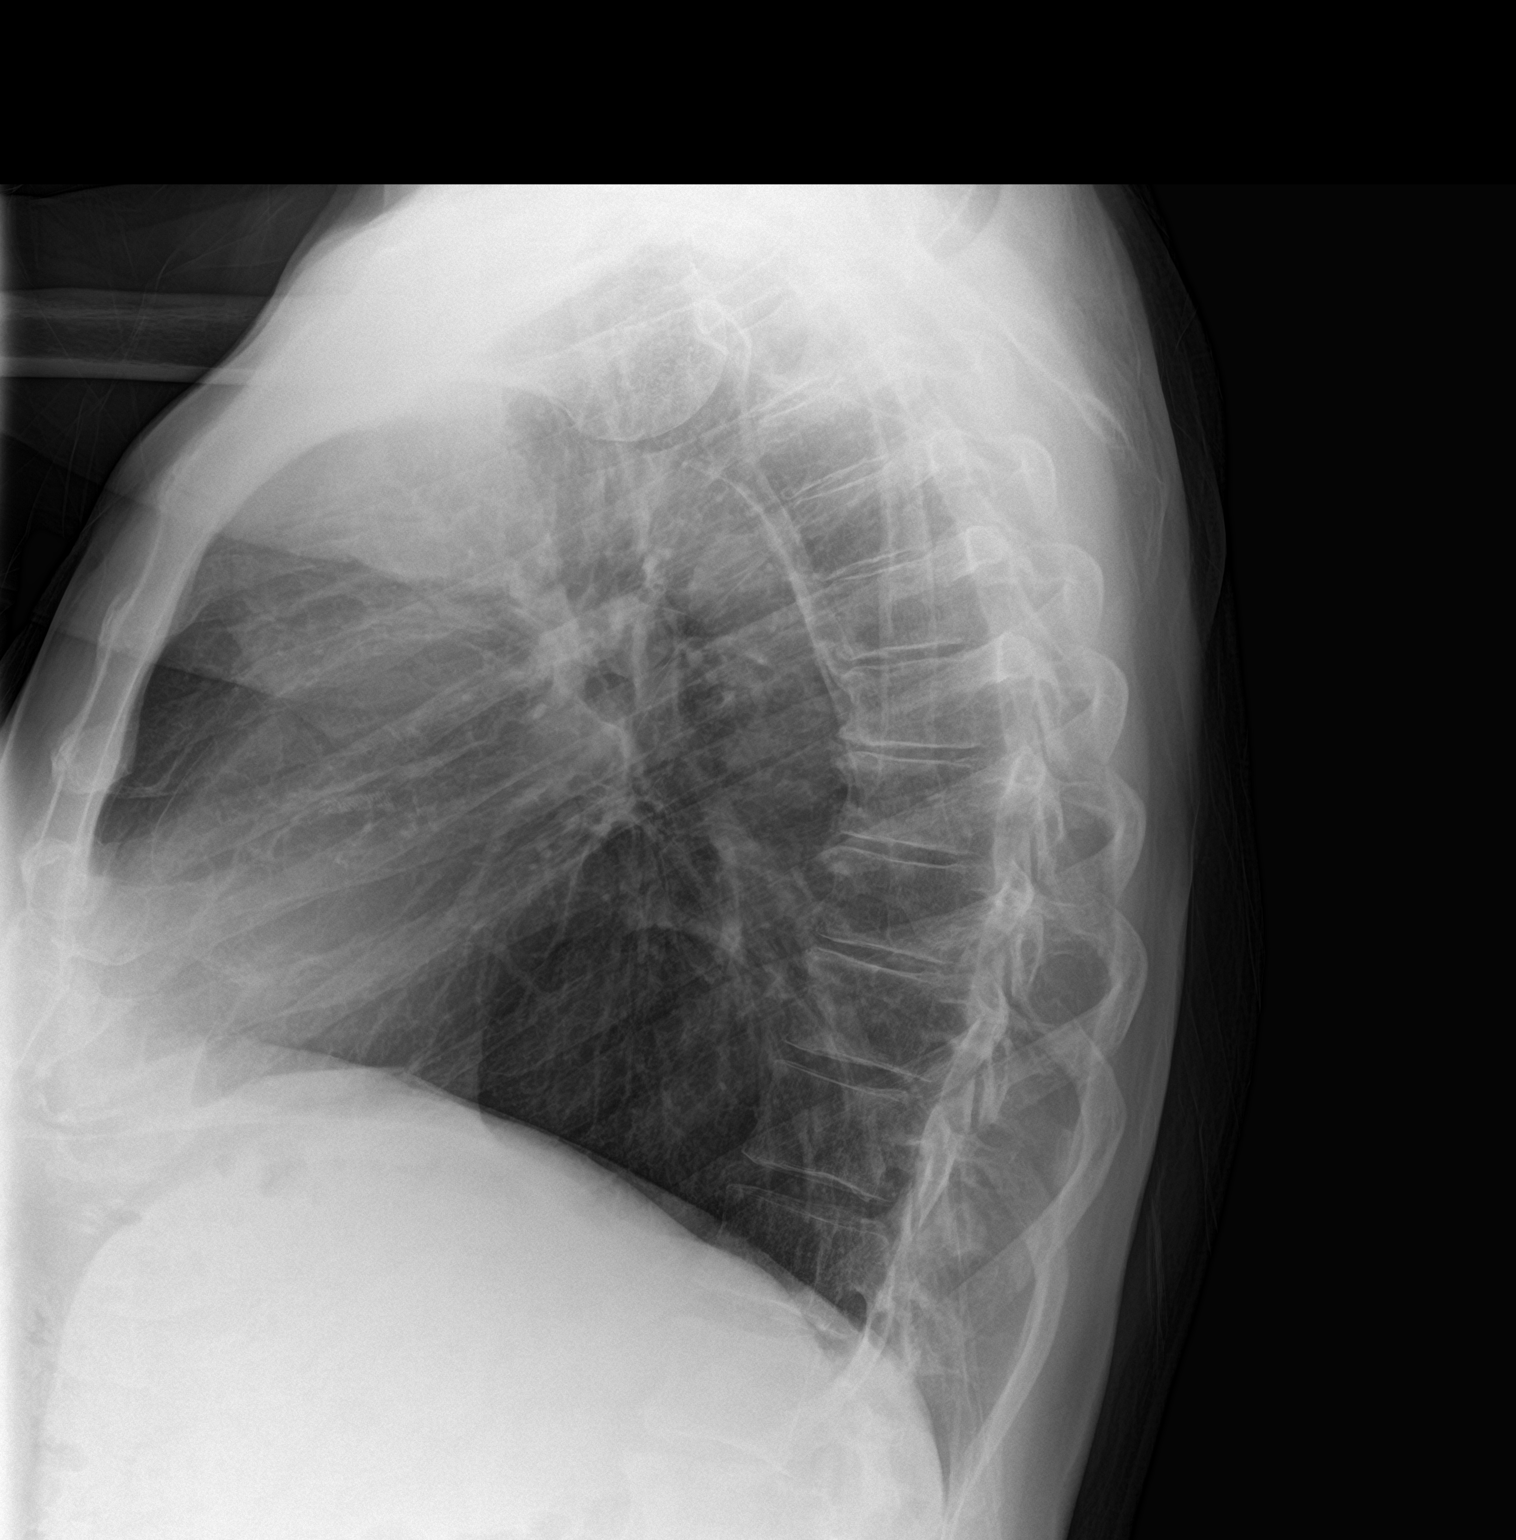

[2 of 2 positions shown; findings below may reference images not displayed]

FINDINGS: The lungs are clear. No plain film evidence of emphysema is seen.
Heart size is normal. Aortic atherosclerosis noted. No acute or
focal bony abnormality.
IMPRESSION: No acute disease.

Aortic Atherosclerosis ([id number].X).

## 2021-07-14 ENCOUNTER — Other Ambulatory Visit: Payer: Self-pay | Admitting: Physician Assistant

## 2021-07-14 ENCOUNTER — Telehealth: Payer: Self-pay

## 2021-07-14 DIAGNOSIS — D485 Neoplasm of uncertain behavior of skin: Secondary | ICD-10-CM | POA: Diagnosis not present

## 2021-07-14 DIAGNOSIS — D225 Melanocytic nevi of trunk: Secondary | ICD-10-CM | POA: Diagnosis not present

## 2021-07-14 DIAGNOSIS — C44319 Basal cell carcinoma of skin of other parts of face: Secondary | ICD-10-CM | POA: Diagnosis not present

## 2021-07-14 DIAGNOSIS — Z8709 Personal history of other diseases of the respiratory system: Secondary | ICD-10-CM

## 2021-07-14 DIAGNOSIS — D2261 Melanocytic nevi of right upper limb, including shoulder: Secondary | ICD-10-CM | POA: Diagnosis not present

## 2021-07-14 DIAGNOSIS — L570 Actinic keratosis: Secondary | ICD-10-CM | POA: Diagnosis not present

## 2021-07-14 DIAGNOSIS — D2272 Melanocytic nevi of left lower limb, including hip: Secondary | ICD-10-CM | POA: Diagnosis not present

## 2021-07-14 DIAGNOSIS — J309 Allergic rhinitis, unspecified: Secondary | ICD-10-CM

## 2021-07-14 DIAGNOSIS — D2262 Melanocytic nevi of left upper limb, including shoulder: Secondary | ICD-10-CM | POA: Diagnosis not present

## 2021-07-14 DIAGNOSIS — X32XXXA Exposure to sunlight, initial encounter: Secondary | ICD-10-CM | POA: Diagnosis not present

## 2021-07-14 DIAGNOSIS — D2271 Melanocytic nevi of right lower limb, including hip: Secondary | ICD-10-CM | POA: Diagnosis not present

## 2021-07-14 DIAGNOSIS — L821 Other seborrheic keratosis: Secondary | ICD-10-CM | POA: Diagnosis not present

## 2021-07-14 DIAGNOSIS — J320 Chronic maxillary sinusitis: Secondary | ICD-10-CM

## 2021-07-14 MED ORDER — LORATADINE 10 MG PO TABS: 10.0000 mg | ORAL_TABLET | Freq: Every day | ORAL | 2 refills | Status: DC

## 2021-07-14 MED ORDER — METHYLPREDNISOLONE 4 MG PO TBPK: ORAL_TABLET | ORAL | 0 refills | Status: DC

## 2021-07-14 NOTE — Telephone Encounter (Signed)
07/14/2021 - Called [facility] stating still having S/Sx since 06/22/2021 visit.  Head still feels stopped up & sinus pain/pressure.  06/22/2021 Dx:  Seasonal allergic rhinitis Tx: Solumedrol 40 mg inj in clinic & Rx for Flonase, Allegra & Bactrim DS  02/24/2021 Dx:  Subacute maxillary sinusitis Rx:  Bactrim DS & Fexofenadine  12/27/2020 Dx:  Subacute maxillary sinusitis Rx:  Augmentin & Allegra D  11/09/2020 Dx:  Hx of paranasal sinus congestion Rx:  Fexofenadine & Medrol Dosepak Xray:  Sinuses Complete at [facility] (11/10/20)  10/14/2020 Dx:  Subacute maxillary sinusitis Rx:  Fexofenadine, Bactrim DS, Sudafed & Medrol dosepack  AMD

## 2021-08-01 ENCOUNTER — Other Ambulatory Visit: Payer: Self-pay | Admitting: Physician Assistant

## 2021-08-01 DIAGNOSIS — I10 Essential (primary) hypertension: Secondary | ICD-10-CM

## 2021-08-02 DIAGNOSIS — H903 Sensorineural hearing loss, bilateral: Secondary | ICD-10-CM | POA: Diagnosis not present

## 2021-08-02 DIAGNOSIS — J309 Allergic rhinitis, unspecified: Secondary | ICD-10-CM | POA: Diagnosis not present

## 2021-08-02 DIAGNOSIS — H1010 Acute atopic conjunctivitis, unspecified eye: Secondary | ICD-10-CM | POA: Diagnosis not present

## 2021-08-02 DIAGNOSIS — H6123 Impacted cerumen, bilateral: Secondary | ICD-10-CM | POA: Diagnosis not present

## 2021-08-04 DIAGNOSIS — J301 Allergic rhinitis due to pollen: Secondary | ICD-10-CM | POA: Diagnosis not present

## 2021-08-05 ENCOUNTER — Other Ambulatory Visit: Payer: Self-pay | Admitting: Physician Assistant

## 2021-08-05 DIAGNOSIS — I10 Essential (primary) hypertension: Secondary | ICD-10-CM

## 2021-08-10 ENCOUNTER — Other Ambulatory Visit: Payer: Self-pay

## 2021-08-10 DIAGNOSIS — I10 Essential (primary) hypertension: Secondary | ICD-10-CM

## 2021-08-11 ENCOUNTER — Other Ambulatory Visit: Payer: Self-pay

## 2021-08-11 DIAGNOSIS — I10 Essential (primary) hypertension: Secondary | ICD-10-CM

## 2021-08-11 NOTE — Telephone Encounter (Signed)
Rx refill requests for Lisinopril, Metoprolol & Aldactone re-routed to Durward Parcel, PA-C.  AMD

## 2021-08-12 MED ORDER — METOPROLOL TARTRATE 25 MG PO TABS
25.0000 mg | ORAL_TABLET | Freq: Two times a day (BID) | ORAL | 3 refills | Status: DC
Start: 2021-08-12 — End: 2022-08-02

## 2021-08-12 MED ORDER — SPIRONOLACTONE 25 MG PO TABS: 12.5000 mg | ORAL_TABLET | Freq: Every day | ORAL | 3 refills | Status: DC

## 2021-09-08 DIAGNOSIS — C44319 Basal cell carcinoma of skin of other parts of face: Secondary | ICD-10-CM | POA: Diagnosis not present

## 2021-09-09 ENCOUNTER — Other Ambulatory Visit: Payer: Self-pay | Admitting: Physician Assistant

## 2021-09-09 DIAGNOSIS — F419 Anxiety disorder, unspecified: Secondary | ICD-10-CM

## 2021-09-19 ENCOUNTER — Other Ambulatory Visit: Payer: Self-pay

## 2021-09-19 ENCOUNTER — Other Ambulatory Visit: Payer: 59

## 2021-09-19 DIAGNOSIS — Z1152 Encounter for screening for COVID-19: Secondary | ICD-10-CM

## 2021-09-19 DIAGNOSIS — F419 Anxiety disorder, unspecified: Secondary | ICD-10-CM

## 2021-09-19 LAB — POC COVID19 BINAXNOW: SARS Coronavirus 2 Ag: NEGATIVE

## 2021-09-19 MED ORDER — ALPRAZOLAM 0.5 MG PO TABS: 0.5000 mg | ORAL_TABLET | Freq: Two times a day (BID) | ORAL | 2 refills | Status: DC | PRN

## 2021-09-19 NOTE — Progress Notes (Signed)
Wife tested positive today.

## 2021-09-21 ENCOUNTER — Other Ambulatory Visit: Payer: Self-pay

## 2021-09-21 DIAGNOSIS — Z1152 Encounter for screening for COVID-19: Secondary | ICD-10-CM

## 2021-09-21 LAB — POC COVID19 BINAXNOW: SARS Coronavirus 2 Ag: NEGATIVE

## 2021-09-21 NOTE — Progress Notes (Signed)
Pt going on trip today needs confirmation of a negative covid test.

## 2021-10-13 DIAGNOSIS — R0982 Postnasal drip: Secondary | ICD-10-CM | POA: Diagnosis not present

## 2021-10-13 DIAGNOSIS — H60332 Swimmer's ear, left ear: Secondary | ICD-10-CM | POA: Diagnosis not present

## 2021-10-18 ENCOUNTER — Ambulatory Visit: Payer: 59 | Admitting: Physician Assistant

## 2021-10-18 ENCOUNTER — Encounter: Payer: Self-pay | Admitting: Physician Assistant

## 2021-10-18 DIAGNOSIS — Z1152 Encounter for screening for COVID-19: Secondary | ICD-10-CM

## 2021-10-18 DIAGNOSIS — J320 Chronic maxillary sinusitis: Secondary | ICD-10-CM

## 2021-10-18 LAB — POC COVID19 BINAXNOW: SARS Coronavirus 2 Ag: NEGATIVE

## 2021-10-18 MED ORDER — TRIAMCINOLONE ACETONIDE 40 MG/ML IJ SUSP
40.0000 mg | Freq: Once | INTRAMUSCULAR | Status: AC
  Administered 2021-10-18: 40 mg via INTRAMUSCULAR

## 2021-10-18 MED ORDER — FEXOFENADINE HCL 60 MG PO TABS: 60.0000 mg | ORAL_TABLET | Freq: Two times a day (BID) | ORAL | 0 refills | Status: DC

## 2021-10-18 MED ORDER — AMOXICILLIN-POT CLAVULANATE 875-125 MG PO TABS: 1.0000 | ORAL_TABLET | Freq: Two times a day (BID) | ORAL | 0 refills | Status: DC

## 2021-10-18 NOTE — Progress Notes (Signed)
   Subjective: Sinus congestion    Patient ID: Gabriel Butler, male    DOB: [birth year], 71 y.o.   MRN: [id number]  HPI Patient presents with 3 days of increasing sinus pressure.  Patient states allergy symptoms for the past 10 days.  Denies fever chills associated complaint.  Patient tested negative for COVID-19.  Patient has a history of recurrent sinusitis.  Patient has referral to ENT for definitive evaluation and treatment.   Review of Systems Coronary artery disease, diabetes,    Objective:   Physical Exam See nurses notes for vital signs. HEENT remarkable for right edematous nasal turbinates.  Postnasal drainage.  Moderate guarding palpation bilateral maxillary sinuses.  No postnasal drainage.  Lungs are clear to auscultation.  Heart is regular rate and rhythm.       Assessment & Plan: Subacute maxillary sinusitis  Patient given a prescription for Augmentin and fexofenadine.  Patient advised to use Flonase nasal spray on a daily basis.  Follow-up with ENT appointment.

## 2021-10-18 NOTE — Progress Notes (Signed)
3 days of sinus pressure and pain on right side of face with drainage.   Covid test:Negative.

## 2021-11-17 DIAGNOSIS — C44319 Basal cell carcinoma of skin of other parts of face: Secondary | ICD-10-CM | POA: Diagnosis not present

## 2021-11-29 ENCOUNTER — Other Ambulatory Visit: Payer: Self-pay

## 2021-11-29 ENCOUNTER — Other Ambulatory Visit: Payer: Self-pay | Admitting: Physician Assistant

## 2021-11-29 DIAGNOSIS — J309 Allergic rhinitis, unspecified: Secondary | ICD-10-CM

## 2021-11-29 MED ORDER — FLUTICASONE PROPIONATE 50 MCG/ACT NA SUSP: 2.0000 | Freq: Every day | NASAL | 6 refills | Status: AC

## 2021-12-07 ENCOUNTER — Ambulatory Visit: Payer: 59 | Admitting: Physician Assistant

## 2021-12-07 ENCOUNTER — Encounter: Payer: Self-pay | Admitting: Physician Assistant

## 2021-12-07 DIAGNOSIS — F419 Anxiety disorder, unspecified: Secondary | ICD-10-CM

## 2021-12-07 MED ORDER — ALPRAZOLAM 0.5 MG PO TABS: 0.5000 mg | ORAL_TABLET | Freq: Three times a day (TID) | ORAL | 3 refills | Status: DC | PRN

## 2021-12-07 NOTE — Progress Notes (Signed)
   Subjective: Anxiety    Patient ID: Gabriel Butler, male    DOB: 10-03-1950, 71 y.o.   MRN: [id number]  HPI Patient presents for reevaluation medication for anxiety.  Patient stated in the past 2 months that has been increased interpersonal stressors at home.  Patient states sister-in-law and mother-in-law has moved in to his family.  Patient states he is hoping this will become a short lived situation but he is anticipating 3-4 more months.  Patient currently takes Xanax 0.5 mg twice daily as needed.  Patient said he is finding more reliance on the medication and dealing with his family.  Patient is wondering could the medication be increased.   Review of Systems Hypertension, coronary artery disease, GERD, diabetes, chronic kidney disease, and hyperlipidemia.    Objective:   Physical Exam  See nurses note for vital signs.      Assessment & Plan: Anxiety  Discussed rationale for not increasing the dosage of Xanax.  Patient is amenable to taking 0.5 mg 3 times daily as needed.  Discussed rationale for trying to decrease stressor f which is causing anxiety.

## 2021-12-07 NOTE — Progress Notes (Signed)
Pt presents today for medication concerns.

## 2021-12-10 IMAGING — CR DG SINUSES COMPLETE 3+V
1 series · 4 of 4 positions shown · non-contrast
Comparison: Head CT 07/28/2008.

CLINICAL DATA: 70-year-old male with nasal congestion for 4-5
weeks. Sinus congestion.

EXAM:
PARANASAL SINUSES - COMPLETE 3 + VIEW

[Series 1: dg sinuses complete · 0.14mm/px · 4 of 4 slices shown]
[im 1/4]
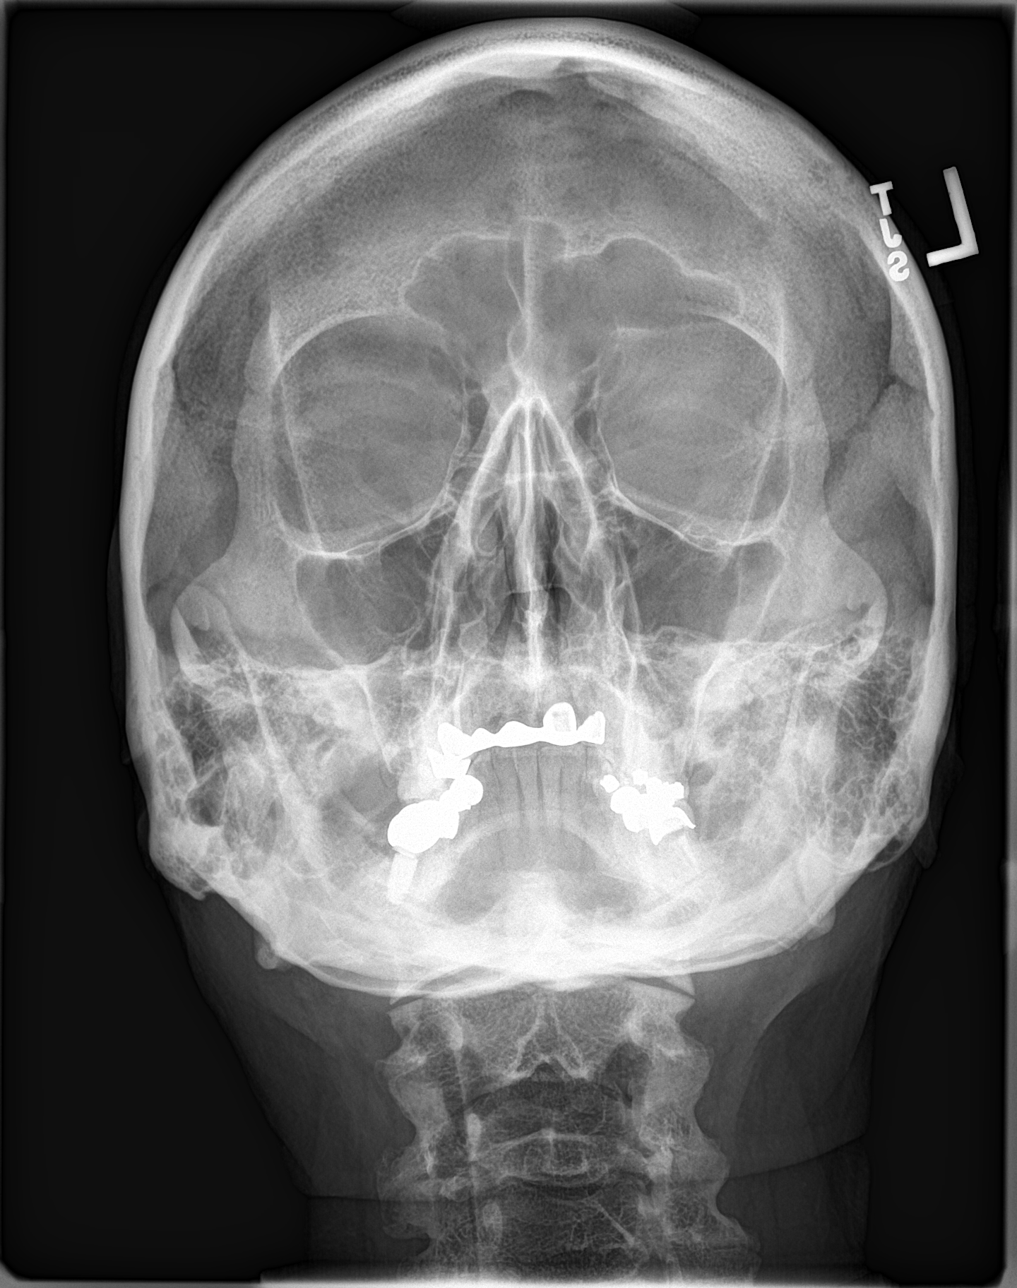
[im 2/4]
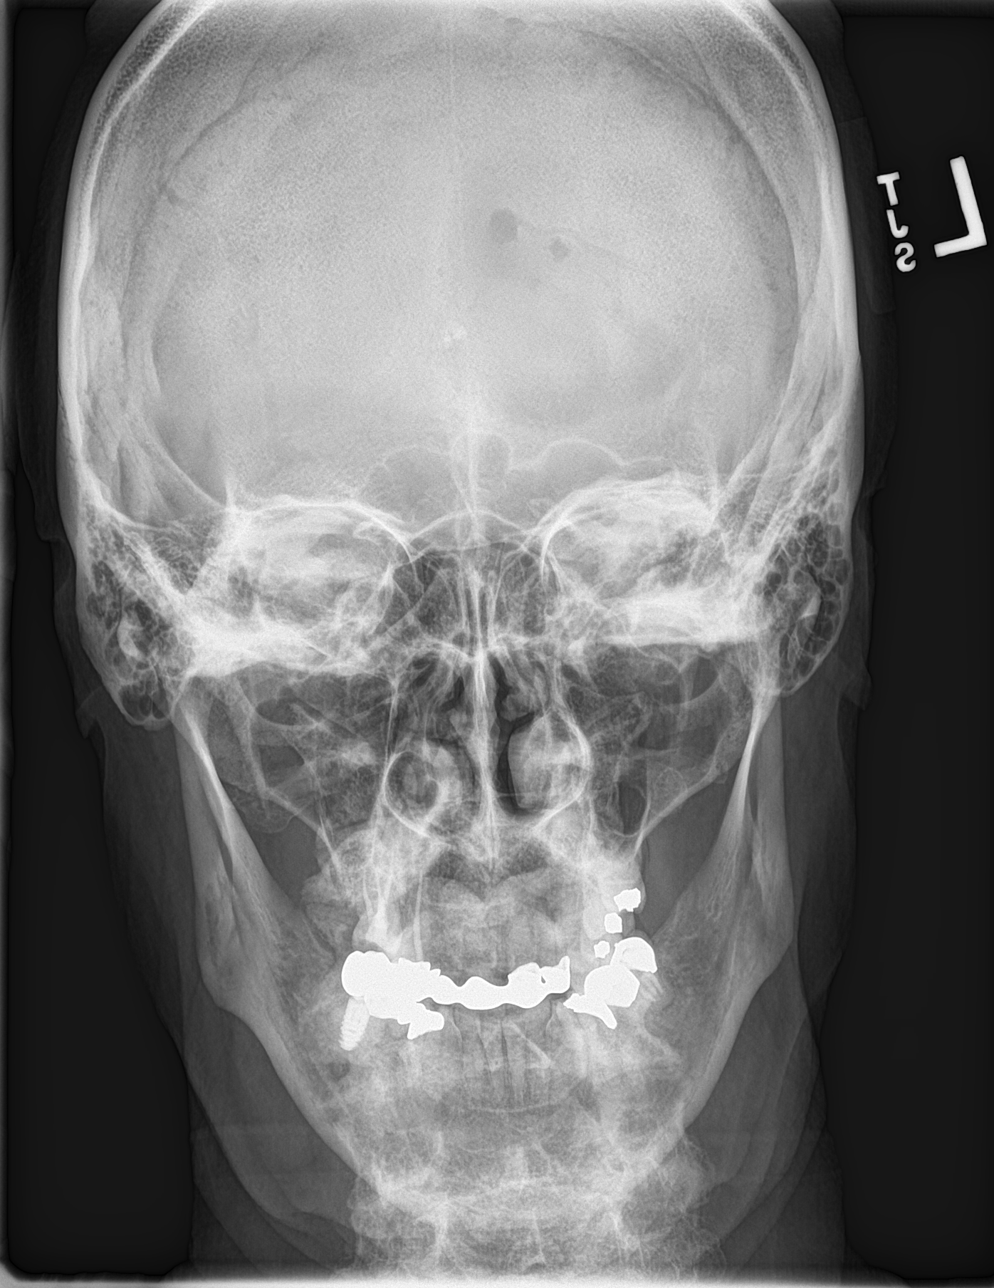
[im 3/4]
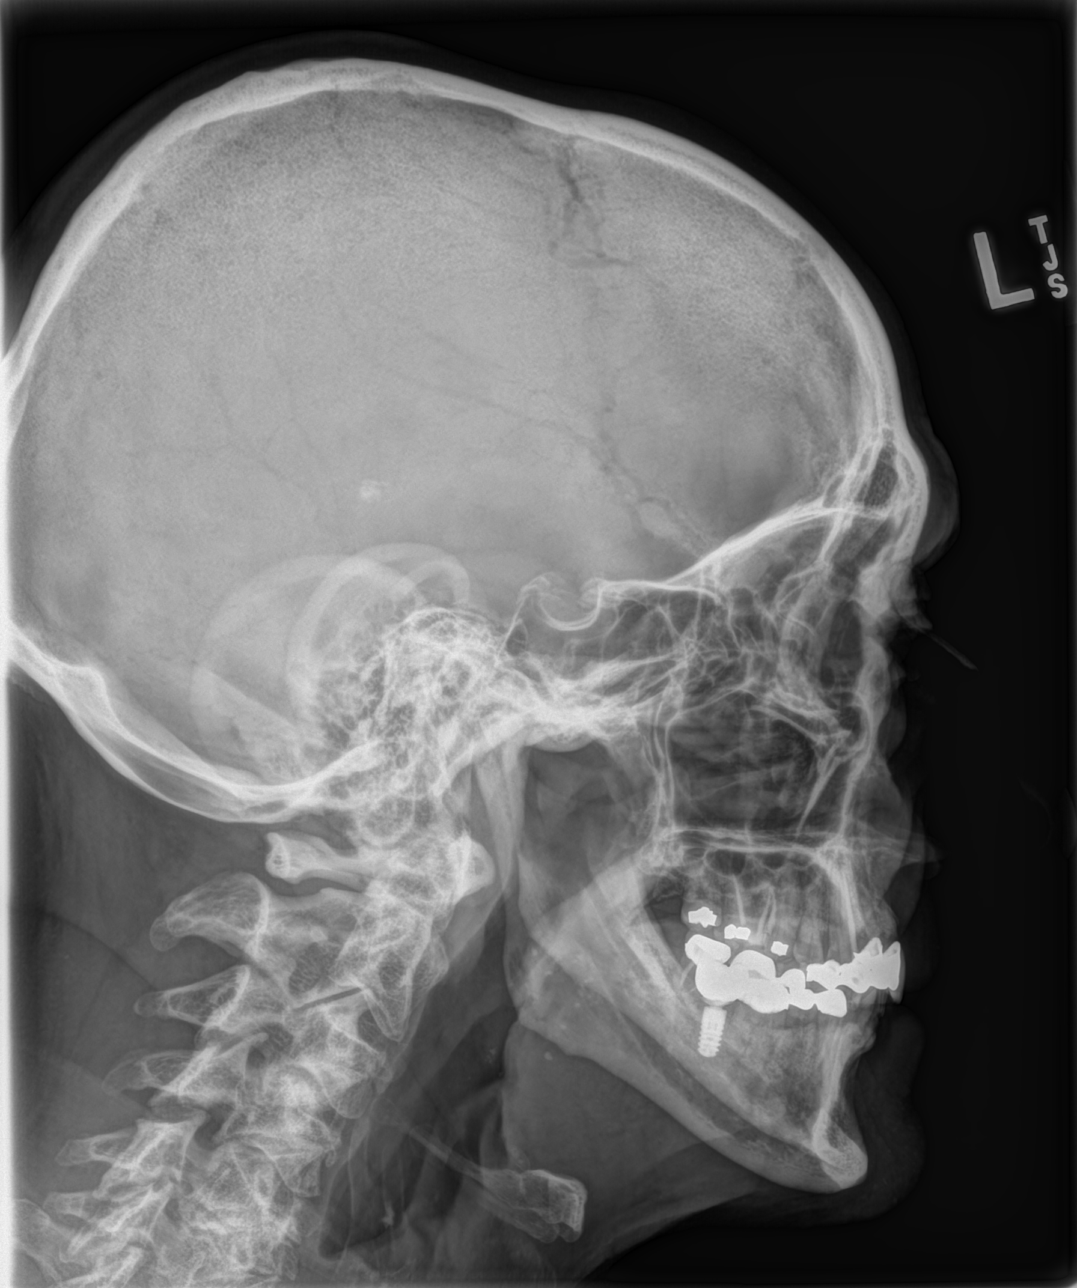
[im 4/4]
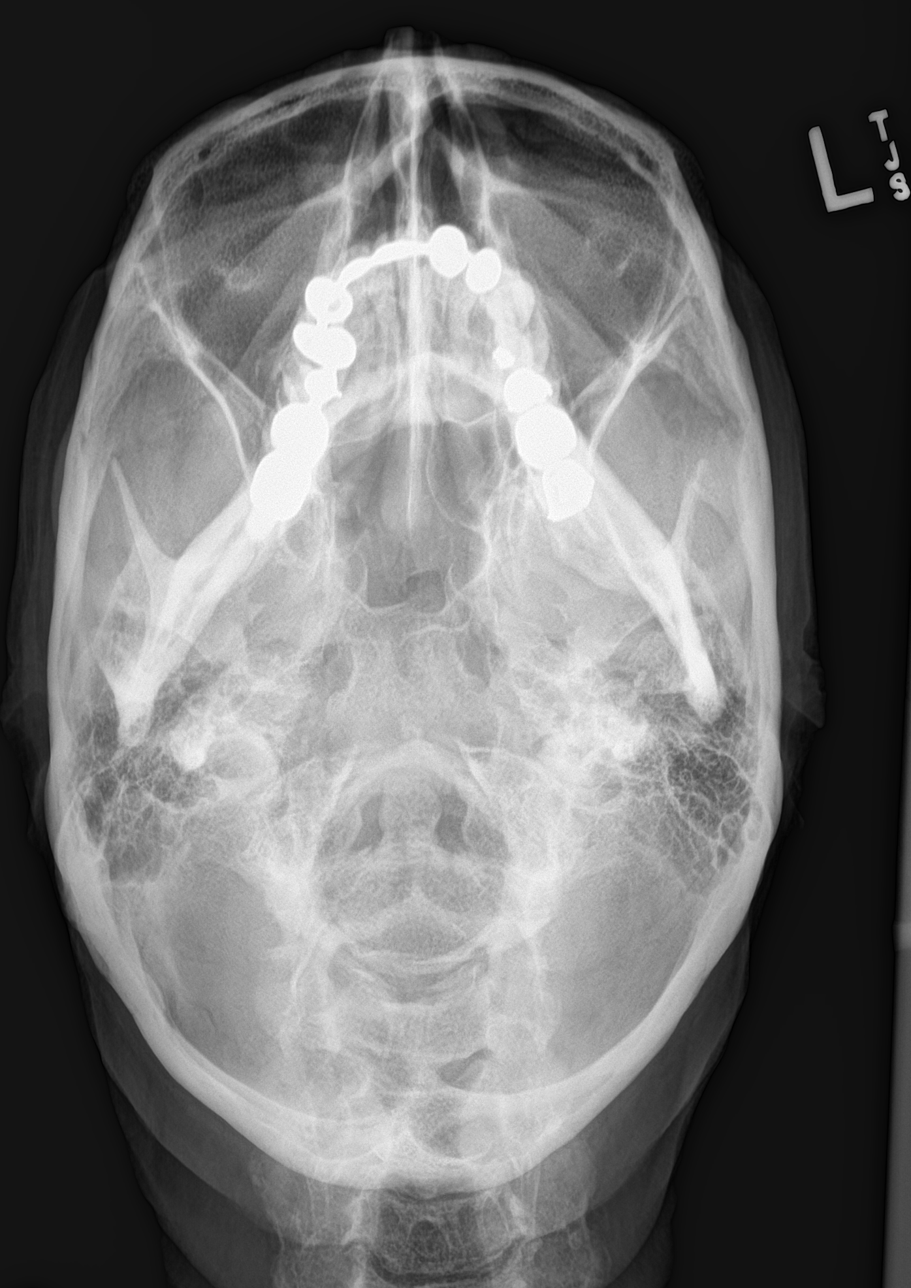

[4 of 4 positions shown; findings below may reference images not displayed]

FINDINGS: 4 radiographic views. Paranasal sinuses appear symmetrically and
normally aerated. Mastoid air cell aeration likewise appears
symmetric. No osseous abnormality identified.
IMPRESSION: Normal radiographic sinus series.

## 2021-12-12 DIAGNOSIS — E1122 Type 2 diabetes mellitus with diabetic chronic kidney disease: Secondary | ICD-10-CM | POA: Diagnosis not present

## 2021-12-12 DIAGNOSIS — N182 Chronic kidney disease, stage 2 (mild): Secondary | ICD-10-CM | POA: Diagnosis not present

## 2021-12-12 DIAGNOSIS — I10 Essential (primary) hypertension: Secondary | ICD-10-CM | POA: Diagnosis not present

## 2022-01-11 ENCOUNTER — Other Ambulatory Visit: Payer: Self-pay | Admitting: Physician Assistant

## 2022-01-11 MED ORDER — AMOXICILLIN-POT CLAVULANATE 875-125 MG PO TABS: 1.0000 | ORAL_TABLET | Freq: Two times a day (BID) | ORAL | 0 refills | Status: DC

## 2022-01-11 MED ORDER — SULFAMETHOXAZOLE-TRIMETHOPRIM 800-160 MG PO TABS: 1.0000 | ORAL_TABLET | Freq: Two times a day (BID) | ORAL | 0 refills | Status: DC

## 2022-01-16 DIAGNOSIS — D2262 Melanocytic nevi of left upper limb, including shoulder: Secondary | ICD-10-CM | POA: Diagnosis not present

## 2022-01-16 DIAGNOSIS — C44519 Basal cell carcinoma of skin of other part of trunk: Secondary | ICD-10-CM | POA: Diagnosis not present

## 2022-01-16 DIAGNOSIS — X32XXXA Exposure to sunlight, initial encounter: Secondary | ICD-10-CM | POA: Diagnosis not present

## 2022-01-16 DIAGNOSIS — D2261 Melanocytic nevi of right upper limb, including shoulder: Secondary | ICD-10-CM | POA: Diagnosis not present

## 2022-01-16 DIAGNOSIS — L570 Actinic keratosis: Secondary | ICD-10-CM | POA: Diagnosis not present

## 2022-01-16 DIAGNOSIS — Z85828 Personal history of other malignant neoplasm of skin: Secondary | ICD-10-CM | POA: Diagnosis not present

## 2022-01-16 DIAGNOSIS — D485 Neoplasm of uncertain behavior of skin: Secondary | ICD-10-CM | POA: Diagnosis not present

## 2022-01-16 DIAGNOSIS — L821 Other seborrheic keratosis: Secondary | ICD-10-CM | POA: Diagnosis not present

## 2022-02-09 ENCOUNTER — Ambulatory Visit: Payer: 59 | Admitting: Physician Assistant

## 2022-02-09 ENCOUNTER — Encounter: Payer: Self-pay | Admitting: Physician Assistant

## 2022-02-09 DIAGNOSIS — Z1152 Encounter for screening for COVID-19: Secondary | ICD-10-CM

## 2022-02-09 LAB — POC COVID19 BINAXNOW: SARS Coronavirus 2 Ag: NEGATIVE

## 2022-02-09 LAB — POCT INFLUENZA A/B
Influenza A, POC: NEGATIVE
Influenza B, POC: NEGATIVE

## 2022-02-09 MED ORDER — FEXOFENADINE-PSEUDOEPHED ER 60-120 MG PO TB12: 1.0000 | ORAL_TABLET | Freq: Two times a day (BID) | ORAL | 0 refills | Status: DC

## 2022-02-09 MED ORDER — METHYLPREDNISOLONE SODIUM SUCC 40 MG IJ SOLR
40.0000 mg | Freq: Once | INTRAMUSCULAR | Status: AC
  Administered 2022-02-09: 40 mg via INTRAMUSCULAR

## 2022-02-09 MED ORDER — METHYLPREDNISOLONE 4 MG PO TBPK: ORAL_TABLET | ORAL | 0 refills | Status: DC

## 2022-02-09 NOTE — Progress Notes (Signed)
   Subjective: Sinus congestion    Patient ID: Gabriel Butler, male    DOB: [birth year], 72 y.o.   MRN: [id number]  HPI Patient presents with 1 week of sinus congestion.  Voices concern for sinus infection.  Patient states symptoms occurred after visit from family members and long distance car drive from [place] to [place].  Patient also states other family members were also sick with cold symptoms.  Denies fever chills associated complaint.  Denies known contact with COVID-19.  Patient tested negative for COVID-19 and flu earlier today.  Patient has a history of allergic rhinitis and has not followed up with ENT as directed.   Review of Systems Allergic rhinitis, diabetes, GERD, and hypertension.    Objective:   Physical Exam  HEENT is remarkable for maxillary guarding right greater than left.  Postnasal drainage.  Edematous nasal turbinates. Neck is supple followed lymphadenectomy or bruits. Lungs are clear to auscultation.      Assessment & Plan: Sinus congestion  Patient given 40 mg Solu-Medrol IM followed by Medrol Dosepak.  Patient also given a prescription for Allegra-D.  Patient advised to follow-up if no improvement or worsening complaint.

## 2022-02-09 NOTE — Addendum Note (Signed)
Addended by: Malachy Moan F on: 02/09/2022 11:12 AM   Modules accepted: Orders

## 2022-02-09 NOTE — Progress Notes (Signed)
Pt presents today for covid and flu testing due to sinus issues and being around sick family members.

## 2022-03-14 DIAGNOSIS — C44519 Basal cell carcinoma of skin of other part of trunk: Secondary | ICD-10-CM | POA: Diagnosis not present

## 2022-03-16 DIAGNOSIS — H524 Presbyopia: Secondary | ICD-10-CM | POA: Diagnosis not present

## 2022-03-22 ENCOUNTER — Other Ambulatory Visit: Payer: Self-pay | Admitting: Physician Assistant

## 2022-03-24 ENCOUNTER — Ambulatory Visit: Payer: Self-pay

## 2022-03-24 DIAGNOSIS — Z0000 Encounter for general adult medical examination without abnormal findings: Secondary | ICD-10-CM

## 2022-03-24 DIAGNOSIS — E119 Type 2 diabetes mellitus without complications: Secondary | ICD-10-CM

## 2022-03-24 LAB — POCT URINALYSIS DIPSTICK
Bilirubin, UA: NEGATIVE
Blood, UA: NEGATIVE
Glucose, UA: POSITIVE — AB
Ketones, UA: NEGATIVE
Leukocytes, UA: NEGATIVE — AB
Nitrite, UA: NEGATIVE
Protein, UA: POSITIVE — AB
Spec Grav, UA: 1.015 (ref 1.010–1.025)
Urobilinogen, UA: 0.2 E.U./dL
pH, UA: 6 (ref 5.0–8.0)

## 2022-03-24 NOTE — Progress Notes (Signed)
Pt presents today to complete labs for physical./CL,RMA

## 2022-03-26 LAB — CMP12+LP+TP+TSH+6AC+PSA+CBC…
ALT: 30 IU/L (ref 0–44)
AST: 20 IU/L (ref 0–40)
Albumin/Globulin Ratio: 1.8 (ref 1.2–2.2)
Albumin: 4.2 g/dL (ref 3.9–4.9)
Alkaline Phosphatase: 79 IU/L (ref 44–121)
BUN/Creatinine Ratio: 15 (ref 10–24)
BUN: 19 mg/dL (ref 8–27)
Basophils Absolute: 0 10*3/uL (ref 0.0–0.2)
Basos: 1 %
Bilirubin Total: 0.6 mg/dL (ref 0.0–1.2)
Calcium: 9.8 mg/dL (ref 8.6–10.2)
Chloride: 105 mmol/L (ref 96–106)
Chol/HDL Ratio: 2.3 ratio (ref 0.0–5.0)
Cholesterol, Total: 108 mg/dL (ref 100–199)
Creatinine, Ser: 1.25 mg/dL (ref 0.76–1.27)
EOS (ABSOLUTE): 0.1 10*3/uL (ref 0.0–0.4)
Eos: 2 %
Estimated CHD Risk: <0.5 times avg. (ref 0.0–1.0)
Free Thyroxine Index: 1.1 — ABNORMAL LOW (ref 1.2–4.9)
GGT: 24 IU/L (ref 0–65)
Globulin, Total: 2.4 g/dL (ref 1.5–4.5)
Glucose: 150 mg/dL — ABNORMAL HIGH (ref 70–99)
HDL: 48 mg/dL (ref >39)
Hematocrit: 48.4 % (ref 37.5–51.0)
Hemoglobin: 16.3 g/dL (ref 13.0–17.7)
Immature Grans (Abs): 0 10*3/uL (ref 0.0–0.1)
Immature Granulocytes: 0 %
Iron: 83 ug/dL (ref 38–169)
LDH: 189 IU/L (ref 121–224)
LDL Chol Calc (NIH): 47 mg/dL (ref 0–99)
Lymphocytes Absolute: 1 10*3/uL (ref 0.7–3.1)
Lymphs: 18 %
MCH: 31 pg (ref 26.6–33.0)
MCHC: 33.7 g/dL (ref 31.5–35.7)
MCV: 92 fL (ref 79–97)
Monocytes Absolute: 0.6 10*3/uL (ref 0.1–0.9)
Monocytes: 11 %
Neutrophils Absolute: 3.8 10*3/uL (ref 1.4–7.0)
Neutrophils: 68 %
Phosphorus: 3.8 mg/dL (ref 2.8–4.1)
Platelets: 188 10*3/uL (ref 150–450)
Potassium: 4.4 mmol/L (ref 3.5–5.2)
Prostate Specific Ag, Serum: 3.4 ng/mL (ref 0.0–4.0)
RBC: 5.25 x10E6/uL (ref 4.14–5.80)
RDW: 12.5 % (ref 11.6–15.4)
Sodium: 140 mmol/L (ref 134–144)
T3 Uptake Ratio: 21 % — ABNORMAL LOW (ref 24–39)
T4, Total: 5.2 ug/dL (ref 4.5–12.0)
TSH: 1.05 u[IU]/mL (ref 0.450–4.500)
Total Protein: 6.6 g/dL (ref 6.0–8.5)
Triglycerides: 59 mg/dL (ref 0–149)
Uric Acid: 4.8 mg/dL (ref 3.8–8.4)
VLDL Cholesterol Cal: 13 mg/dL (ref 5–40)
WBC: 5.4 10*3/uL (ref 3.4–10.8)
eGFR: 62 mL/min/{1.73_m2} (ref >59)

## 2022-03-26 LAB — MICROALBUMIN / CREATININE URINE RATIO
Creatinine, Urine: 139.2 mg/dL
Microalb/Creat Ratio: 13 mg/g creat (ref 0–29)
Microalbumin, Urine: 18.3 ug/mL

## 2022-03-26 LAB — HGB A1C W/O EAG: Hgb A1c MFr Bld: 6.7 % — ABNORMAL HIGH (ref 4.8–5.6)

## 2022-04-03 ENCOUNTER — Encounter: Payer: 59 | Admitting: Physician Assistant

## 2022-04-05 ENCOUNTER — Other Ambulatory Visit: Payer: Self-pay

## 2022-04-05 ENCOUNTER — Ambulatory Visit: Payer: Self-pay | Admitting: Physician Assistant

## 2022-04-05 ENCOUNTER — Encounter: Payer: Self-pay | Admitting: Physician Assistant

## 2022-04-05 ENCOUNTER — Other Ambulatory Visit: Payer: Self-pay | Admitting: Physician Assistant

## 2022-04-05 VITALS — BP 112/83 | HR 57 | Temp 97.6°F | Resp 14 | Ht 75.0 in | Wt 205.0 lb

## 2022-04-05 DIAGNOSIS — Z0000 Encounter for general adult medical examination without abnormal findings: Secondary | ICD-10-CM

## 2022-04-05 DIAGNOSIS — F419 Anxiety disorder, unspecified: Secondary | ICD-10-CM

## 2022-04-05 DIAGNOSIS — R7309 Other abnormal glucose: Secondary | ICD-10-CM

## 2022-04-05 MED ORDER — ALPRAZOLAM 0.25 MG PO TABS: 0.2500 mg | ORAL_TABLET | Freq: Every evening | ORAL | 0 refills | Status: DC | PRN

## 2022-04-05 NOTE — Progress Notes (Signed)
Pt presents today to complete physical, Pt conerned of glucose levels, Per ann dobbins pt states she said steroids may have spiked sugars and pt requesting retest.

## 2022-04-05 NOTE — Addendum Note (Signed)
Addended by: Aliene Altes on: 04/05/2022 10:29 AM   Modules accepted: Orders

## 2022-04-05 NOTE — Progress Notes (Signed)
[facility] ____________________________________________   None    (approximate)  I have reviewed the triage vital signs and the nursing notes.   HISTORY  Chief Complaint Annual Exam  HPI Gabriel Butler is a 72 y.o. male patient presents for annual physical exam.  States increasing anxiety level and intermittent insomnia.         Past Medical History:  Diagnosis Date   GERD (gastroesophageal reflux disease)    Heart attack ([place]) 03/05/2013   Coronary stent placement.   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   Chronic kidney disease 11/26/2019   Disorder of bursae of shoulder region 12/11/2018   Contusion of shoulder region 12/11/2018   Right inguinal hernia 03/20/2014   Essential hypertension 05/15/2013   Ischemic cardiomyopathy 03/14/2013   Pure hypercholesterolemia 03/14/2013   Coronary artery disease involving native coronary artery of native heart without angina pectoris 03/07/2013   Diabetes mellitus ([place]) 03/07/2013   GERD (gastroesophageal reflux disease) 03/07/2013   STEMI (ST elevation myocardial infarction) ([place]) 03/07/2013   cardiac cath 2013   Disorder of intestine 11/21/2010   History of colonic polyps 11/21/2010    Past Surgical History:  Procedure Laterality Date   COLONOSCOPY  2013   CORONARY ANGIOPLASTY WITH STENT PLACEMENT     HERNIA REPAIR Right 03/26/14   Direct and indirect hernia noted, large ultra Pro mesh utilized.    Prior to Admission medications   Medication Sig Start Date End Date Taking? Authorizing Provider  ALPRAZolam Duanne Moron) 0.5 MG tablet Take 1 tablet (0.5 mg total) by mouth 3 (three) times daily as needed for anxiety. 12/07/21  Yes Sable Feil, PA-C  aspirin 81 MG EC tablet Take 81 mg by mouth daily. Swallow whole.   Yes [provider]  atorvastatin (LIPITOR) 80 MG tablet  TAKE ONE TABLET BY MOUTH AT BEDTIME 02/03/20  Yes Sable Feil, PA-C  clopidogrel (PLAVIX) 75 MG tablet Take 1 tablet by mouth daily. 11/03/18  Yes [provider]  clotrimazole-betamethasone (LOTRISONE) cream APPLY TO AFFECTED AREAS DAILY AS DIRECTED 10/16/18  Yes Lebron Conners D, MD  diclofenac sodium (VOLTAREN) 1 % GEL Apply 4 g topically 4 (four) times daily. 07/12/15  Yes Hyatt, Max T, DPM  empagliflozin (JARDIANCE) 10 MG TABS tablet  05/03/20  Yes [provider]  ezetimibe (ZETIA) 10 MG tablet Take 1 tablet by mouth daily. 11/03/18  Yes [provider]  fexofenadine (ALLEGRA) 60 MG tablet Take 1 tablet (60 mg total) by mouth 2 (two) times daily. 10/18/21  Yes Sable Feil, PA-C  fluticasone (FLONASE) 50 MCG/ACT nasal spray Place 2 sprays into both nostrils daily. 11/29/21  Yes Sable Feil, PA-C  ipratropium (ATROVENT) 0.06 % nasal spray  04/10/17  Yes [provider]  isosorbide mononitrate (IMDUR) 30 MG 24 hr tablet    Yes [provider]  lisinopril (ZESTRIL) 10 MG tablet Take 1 tablet (10 mg total) by mouth daily. 08/12/21  Yes Sable Feil, PA-C  loratadine (CLARITIN) 10 MG tablet Take 1 tablet (10 mg total) by mouth daily. 07/14/21  Yes Sable Feil, PA-C  methylPREDNISolone (MEDROL DOSEPAK) 4 MG TBPK tablet Take Tapered dose as directed 02/09/22  Yes Sable Feil, PA-C  metoprolol succinate (TOPROL-XL) 50 MG 24 hr tablet TAKE ONE TABLET BY MOUTH TWICE DAILY 05/04/20  Yes Mardee Postin  K, PA-C  metoprolol tartrate (LOPRESSOR) 25 MG tablet Take 1 tablet (25 mg total) by mouth 2 (two) times daily. 08/12/21  Yes Sable Feil, PA-C  metroNIDAZOLE (METROGEL) 1 % gel APPLY TO AFFECTED AREAS EVERY DAY 08/25/19  Yes Brendolyn Patty, MD  mupirocin ointment (BACTROBAN) 2 % Apply 1 application. topically daily. 11/21/18  Yes [provider]  nitroGLYCERIN (NITROSTAT) 0.4 MG SL tablet Take 1 tablet by mouth daily as needed. 04/27/17  Yes  [provider]  pantoprazole (PROTONIX) 40 MG tablet Take one tablet once to twice daily prn 10/20/19  Yes Sable Feil, PA-C  sertraline (ZOLOFT) 50 MG tablet Take 3 tablets (150 mg total) by mouth daily. 04/21/21  Yes Sable Feil, PA-C  spironolactone (ALDACTONE) 25 MG tablet Take 0.5 tablets (12.5 mg total) by mouth daily. 08/12/21  Yes Sable Feil, PA-C  traZODone (DESYREL) 50 MG tablet Take 1 to 2 tablets po hs prn for insomnia 10/20/19  Yes Sable Feil, PA-C  fexofenadine-pseudoephedrine (ALLEGRA-D) 60-120 MG 12 hr tablet Take 1 tablet by mouth 2 (two) times daily. 02/04/20   Sable Feil, PA-C  fexofenadine-pseudoephedrine (ALLEGRA-D) 60-120 MG 12 hr tablet Take 1 tablet by mouth 2 (two) times daily. 08/17/20   Sable Feil, PA-C    Allergies Bee pollen, Other, Pollen extract, and Pseudoephedrine-naproxen na er  Family History  Problem Relation Age of Onset   Lung cancer Father    Skin cancer Brother     Social History Social History   Tobacco Use   Smoking status: Never   Smokeless tobacco: Never  Substance Use Topics   Alcohol use: Yes    Alcohol/week: 0.0 standard drinks of alcohol   Drug use: No    Review of Systems Constitutional: No fever/chills Eyes: No visual changes. ENT: No sore throat. Cardiovascular: Denies chest pain. Respiratory: Denies shortness of breath.  Coronary artery disease. Gastrointestinal: No abdominal pain.  No nausea, no vomiting.  No diarrhea.  No constipation. Genitourinary: Negative for dysuria. Musculoskeletal: Negative for back pain. Skin: Negative for rash. Neurological: Negative for headaches, focal weakness or numbness. Psychiatric: Anxiety and insomnia Endocrine: Diabetes, hyperlipidemia, and hypertension. Allergic/Immunilogical: Bee pollen, Sudafed, and naproxen. ____________________________________________   PHYSICAL EXAM:  VITAL SIGNS: BP is 112/83, pulse 57, respiration 14, temperature 97.6,  patient 96% O2 sat on room air.  Patient weighs 205 pounds and BMI is 25.62. Constitutional: Alert and oriented. Well appearing and in no acute distress. Eyes: Conjunctivae are normal. PERRL. EOMI. Head: Atraumatic. Nose: No congestion/rhinnorhea. Mouth/Throat: Mucous membranes are moist.  Oropharynx non-erythematous. Neck: No stridor.  No cervical spine tenderness to palpation. Hematological/Lymphatic/Immunilogical: No cervical lymphadenopathy. Cardiovascular: Normal rate, regular rhythm. Grossly normal heart sounds.  Good peripheral circulation. Respiratory: Normal respiratory effort.  No retractions. Lungs CTAB. Gastrointestinal: Soft and nontender. No distention. No abdominal bruits. No CVA tenderness. Genitourinary: Deferred Musculoskeletal: No lower extremity tenderness nor edema.  No joint effusions. Neurologic:  Normal speech and language. No gross focal neurologic deficits are appreciated. No gait instability. Skin:  Skin is warm, dry and intact. No rash noted. Psychiatric: Mood and affect are normal. Speech and behavior are normal.  ____________________________________________   LABS         Component Ref Range & Units 12 d ago 1 yr ago 2 yr ago 3 yr ago  Color, UA  dark yellow VC Dark Yellow yellow yellow  Clarity, UA  clear VC Clear clear clear  Glucose, UA Negative Positive Abnormal  VC Positive Abnormal  CM Positive Abnormal  CM Negative  Comment: 3+  Bilirubin, UA  neg VC Negativ e negative negative  Ketones, UA  neg Negative negative negative  Spec Grav, UA 1.010 - 1.025 1.015 VC 1.015 1.015 1.020  Blood, UA  neg Negative negative negative  pH, UA 5.0 - 8.0 6.0 6.0 6.0 6.0  Protein, UA Negative Positive Abnormal  VC Negative Negative Negative  Comment: trace -+  Urobilinogen, UA 0.2 or 1.0 E.U./dL 0.2 0.2 0.2 0.2  Nitrite, UA  neg Negative negative negative  Leukocytes, UA Negative Negative Abnormal  VC Negative Negative Negative  Comment: -+  Appearance  dark VC   light   Odor                      Component Ref Range & Units 12 d ago 1 yr ago 2 yr ago 3 yr ago  Creatinine, Urine Not Estab. mg/dL 139.2 123.2 69.4 93.8  Microalbumin, Urine Not Estab. ug/mL 18.3 15.4 6.0 4.4  Microalb/Creat Ratio 0 - 29 mg/g creat 13 13 CM 9 CM 5 CM  Comment:                        Normal:                0 -  29                        Moderately increased: 30 - 300                        Severely increased:       >300  Resulting Agency  [facility]                                   Component Ref Range & Units 12 d ago (03/24/22) 1 yr ago (03/21/21) 2 yr ago (03/19/20) 2 yr ago (08/20/19) 3 yr ago (03/10/19) 3 yr ago (02/25/19) 3 yr ago (04/10/18)  Glucose 70 - 99 mg/dL 150 High  103 High  109 High  R  113 High  R CANCELED R, CM 112 High  R  Uric Acid 3.8 - 8.4 mg/dL 4.8 4.2 CM 4.9 CM  4.9 CM CANCELED R, CM 5.6 R, CM  Comment:            Therapeutic target for gout patients: <6.0  BUN 8 - 27 mg/dL '19 22 19 22 18 '$ CANCELED R, CM 17  Creatinine, Ser 0.76 - 1.27 mg/dL 1.25 1.03 1.14 1.46 High  1.18 CANCELED R, CM 0.99  eGFR >59 mL/min/1.73 62 79       BUN/Creatinine Ratio 10 - '24 15 21 17 15 15  17  '$ Sodium 134 - 144 mmol/L 140 138 137  140 CANCELED R, CM 139  Potassium 3.5 - 5.2 mmol/L 4.4 4.9 4.6  5.0 CANCELED R, CM 4.6  Chloride 96 - 106 mmol/L 105 103 100  103 CANCELED R, CM 102  Calcium 8.6 - 10.2 mg/dL 9.8 9.4 9.6  9.3 CANCELED R, CM 9.7  Phosphorus 2.8 - 4.1 mg/dL 3.8 3.7 4.0  3.9 CANCELED R, CM 3.4  Total Protein 6.0 - 8.5 g/dL 6.6 6.3 6.9  6.4 CANCELED R, CM 6.5  Albumin 3.9 - 4.9 g/dL 4.2 4.1 R 4.6 R  4.2 R CANCELED R, CM 4.3 R  Globulin, Total 1.5 - 4.5 g/dL 2.4 2.2 2.3  2.2  2.2  Albumin/Globulin Ratio 1.2 - 2.2 1.8 1.9 2.0  1.9  2.0  Bilirubin Total 0.0 - 1.2 mg/dL 0.6 0.3 0.5  0.4 CANCELED R, CM 0.5  Alkaline Phosphatase 44 - 121 IU/L 79 75 82  78 R CANCELED R, CM 69 R  LDH 121 - 224 IU/L 189 197 219  245 High   CANCELED R, CM 190  AST 0 - 40 IU/L '20 26 28  30 '$ CANCELED R, CM 26  ALT 0 - 44 IU/L 30 32 36  34 CANCELED R, CM 33  GGT 0 - 65 IU/L '24 28 26  21 '$ CANCELED R, CM 22  Iron 38 - 169 ug/dL 83 97 105  80 CANCELED R, CM 90  Cholesterol, Total 100 - 199 mg/dL 108 198 109  98 Low  CANCELED R, CM 113  Triglycerides 0 - 149 mg/dL 59 113 64  55 CANCELED R, CM 71  HDL >39 mg/dL 48 49 49  39 Low  CANCELED R, CM 40  VLDL Cholesterol Cal 5 - 40 mg/dL '13 20 14  13 '$ CANCELED R, CM 14  LDL Chol Calc (NIH) 0 - 99 mg/dL 47 129 High  46  46    Chol/HDL Ratio 0.0 - 5.0 ratio 2.3 4.0 CM 2.2 CM  2.5 CM  2.8 CM  Comment:                                   T. Chol/HDL Ratio                                             Men  Women                               1/2 Avg.Risk  3.4    3.3                                   Avg.Risk  5.0    4.4                                2X Avg.Risk  9.6    7.1                                3X Avg.Risk 23.4   11.0  Estimated CHD Risk 0.0 - 1.0 times avg.  < 0.5 0.8 CM  < 0.5 CM   < 0.5 CM   < 0.5 CM  Comment: The CHD Risk is based on the T. Chol/HDL ratio. Other factors affect CHD Risk such as hypertension, smoking, diabetes, severe obesity, and family history of premature CHD.  TSH 0.450 - 4.500 uIU/mL 1.050 1.850 1.240  1.410 CANCELED R, CM 1.450  T4, Total 4.5 - 12.0 ug/dL 5.2 4.7 5.6  4.8 CANCELED R, CM 5.4  T3 Uptake Ratio 24 - 39 % 21 Low  20 Low  22 Low   19 Low  CANCELED R,  CM 21 Low   Free Thyroxine Index 1.2 - 4.9 1.1 Low  0.9 Low  1.2  0.9 Low   1.1 Low   Prostate Specific Ag, Serum 0.0 - 4.0 ng/mL 3.4 3.1 CM 2.9 CM  1.8 CM CANCELED R, CM   Comment: Roche ECLIA methodology. According to the American Urological Association, Serum PSA should decrease and remain at undetectable levels after radical prostatectomy. The AUA defines biochemical recurrence as an initial PSA value 0.2 ng/mL or greater followed by a subsequent confirmatory PSA value 0.2 ng/mL or greater. Values  obtained with different assay methods or kits cannot be used interchangeably. Results cannot be interpreted as absolute evidence of the presence or absence of malignant disease.  WBC 3.4 - 10.8 x10E3/uL 5.4 4.2 6.3  5.1 4.7 5.5  RBC 4.14 - 5.80 x10E6/uL 5.25 5.13 5.58  5.06 5.23 5.06  Hemoglobin 13.0 - 17.7 g/dL 16.3 16.1 17.4  15.8 16.0 15.3  Hematocrit 37.5 - 51.0 % 48.4 46.9 51.5 High   45.3 48.2 44.4  MCV 79 - 97 fL 92 91 92  90 92 88  MCH 26.6 - 33.0 pg 31.0 31.4 31.2  31.2 30.6 30.2  MCHC 31.5 - 35.7 g/dL 33.7 34.3 33.8  34.9 33.2 34.5  RDW 11.6 - 15.4 % 12.5 12.4 12.7  12.2 12.1 12.3  Platelets 150 - 450 x10E3/uL 188 207 236  208 199 204  Neutrophils Not Estab. % 68 56 69  59 54 61  Lymphs Not Estab. % '18 31 22  29 '$ 33 25  Monocytes Not Estab. % '11 10 7  9 10 11  '$ Eos Not Estab. % '2 2 1  2 2 2  '$ Basos Not Estab. % '1 1 1  1 1 1  '$ Neutrophils Absolute 1.4 - 7.0 x10E3/uL 3.8 2.3 4.4  3.1 2.6 3.4  Lymphocytes Absolute 0.7 - 3.1 x10E3/uL 1.0 1.3 1.4  1.5 1.6 1.4  Monocytes Absolute 0.1 - 0.9 x10E3/uL 0.6 0.4 0.5  0.5 0.5 0.6  EOS (ABSOLUTE) 0.0 - 0.4 x10E3/uL 0.1 0.1 0.1  0.1 0.1 0.1  Basophils Absolute 0.0 - 0.2 x10E3/uL 0.0 0.1 0.0  0.1 0.0 0.0  Immature Granulocytes Not Estab. % 0 0 0  0 0 0  Immature Grans (Abs) 0.0 - 0.1 x10E3/uL 0.0 0.0 0.0  0.0 0.0 0.0  Resulting Agency  [facility]                   Component Ref Range & Units 12 d ago (03/24/22) 1 yr ago (03/21/21) 2 yr ago (03/19/20) 2 yr ago (08/20/19) 2 yr ago (05/19/19) 3 yr ago (02/25/19) 3 yr ago (04/10/18)                ____________________________________________  EKG  Sinus bradycardia 53 bpm ____________________________________________    ____________________________________________   INITIAL IMPRESSION / ASSESSMENT AND PLAN As part of my medical decision making, I reviewed the following data within the electronic MEDICAL RECORD NUMBER      No acute findings  on physical exam and EKG.  Patient fasting glucose will be retaken secondary to elevation with recent lab results.  Patient lipid profile is greatly improved.     ____________________________________________   FINAL CLINICAL IMPRESSION    ED Discharge Orders     None        Note:  This document was prepared using Dragon voice recognition software and may include unintentional dictation errors.

## 2022-04-06 LAB — BASIC METABOLIC PANEL
BUN/Creatinine Ratio: 19 (ref 10–24)
BUN: 20 mg/dL (ref 8–27)
CO2: 21 mmol/L (ref 20–29)
Calcium: 9.2 mg/dL (ref 8.6–10.2)
Chloride: 107 mmol/L — ABNORMAL HIGH (ref 96–106)
Creatinine, Ser: 1.04 mg/dL (ref 0.76–1.27)
Glucose: 111 mg/dL — ABNORMAL HIGH (ref 70–99)
Potassium: 4.9 mmol/L (ref 3.5–5.2)
Sodium: 141 mmol/L (ref 134–144)
eGFR: 77 mL/min/{1.73_m2} (ref >59)

## 2022-04-10 ENCOUNTER — Telehealth: Payer: Self-pay

## 2022-04-10 DIAGNOSIS — G4730 Sleep apnea, unspecified: Secondary | ICD-10-CM

## 2022-04-10 DIAGNOSIS — I519 Heart disease, unspecified: Secondary | ICD-10-CM

## 2022-04-10 DIAGNOSIS — R0683 Snoring: Secondary | ICD-10-CM

## 2022-04-10 NOTE — Telephone Encounter (Signed)
Called clinic & talked with Randel Pigg, PA-C. Requesting sleep study. Ron ordered Sleep Study with [facility]. Referral faxed to [facility].  AMD

## 2022-04-13 DIAGNOSIS — H902 Conductive hearing loss, unspecified: Secondary | ICD-10-CM | POA: Diagnosis not present

## 2022-04-13 DIAGNOSIS — H6123 Impacted cerumen, bilateral: Secondary | ICD-10-CM | POA: Diagnosis not present

## 2022-04-28 IMAGING — CR DG ELBOW COMPLETE 3+V*R*
1 series · 4 of 4 positions shown · non-contrast
Comparison: None.

CLINICAL DATA: 70-year-old male with history of nontraumatic
olecranon bursitis.

EXAM:
RIGHT ELBOW - COMPLETE 3+ VIEW

[Series 1: dg elbow complete right (3+view) · 0.14mm/px · 4 of 4 slices shown]
[im 1/4]
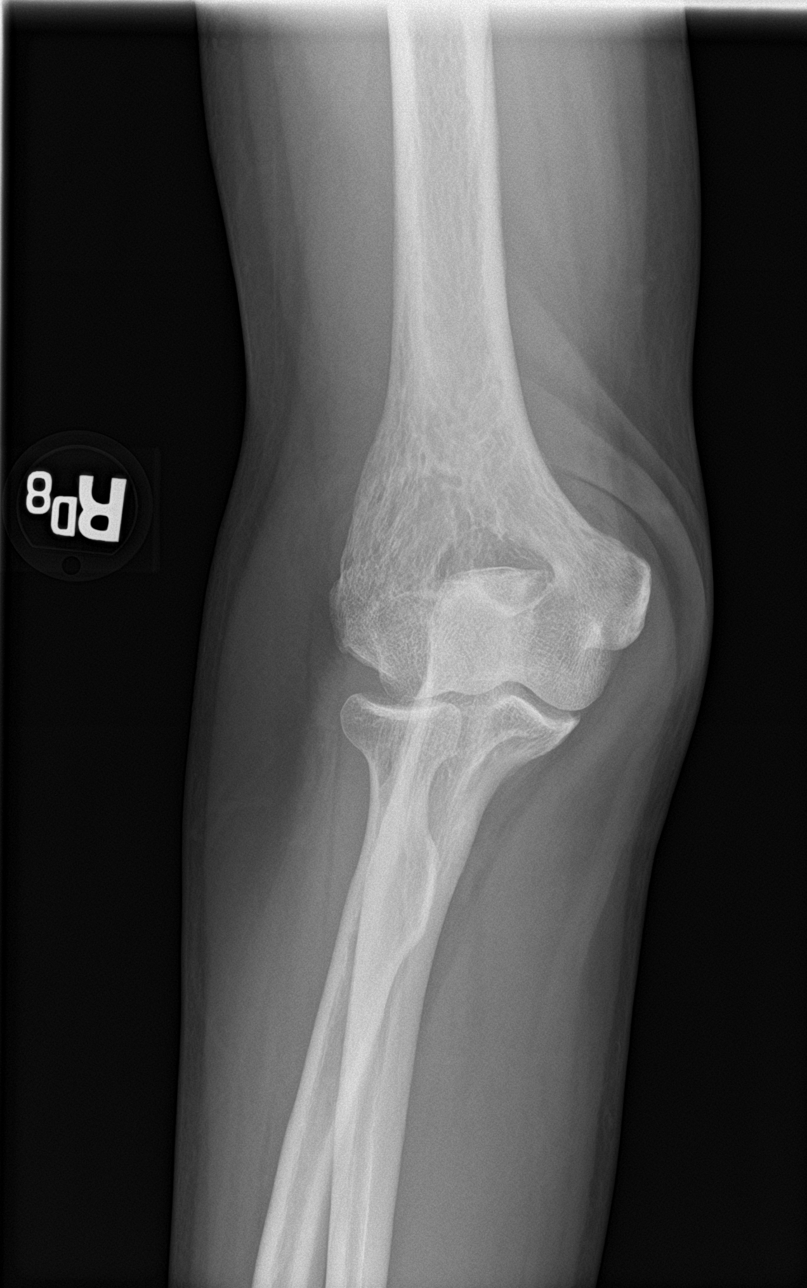
[im 2/4]
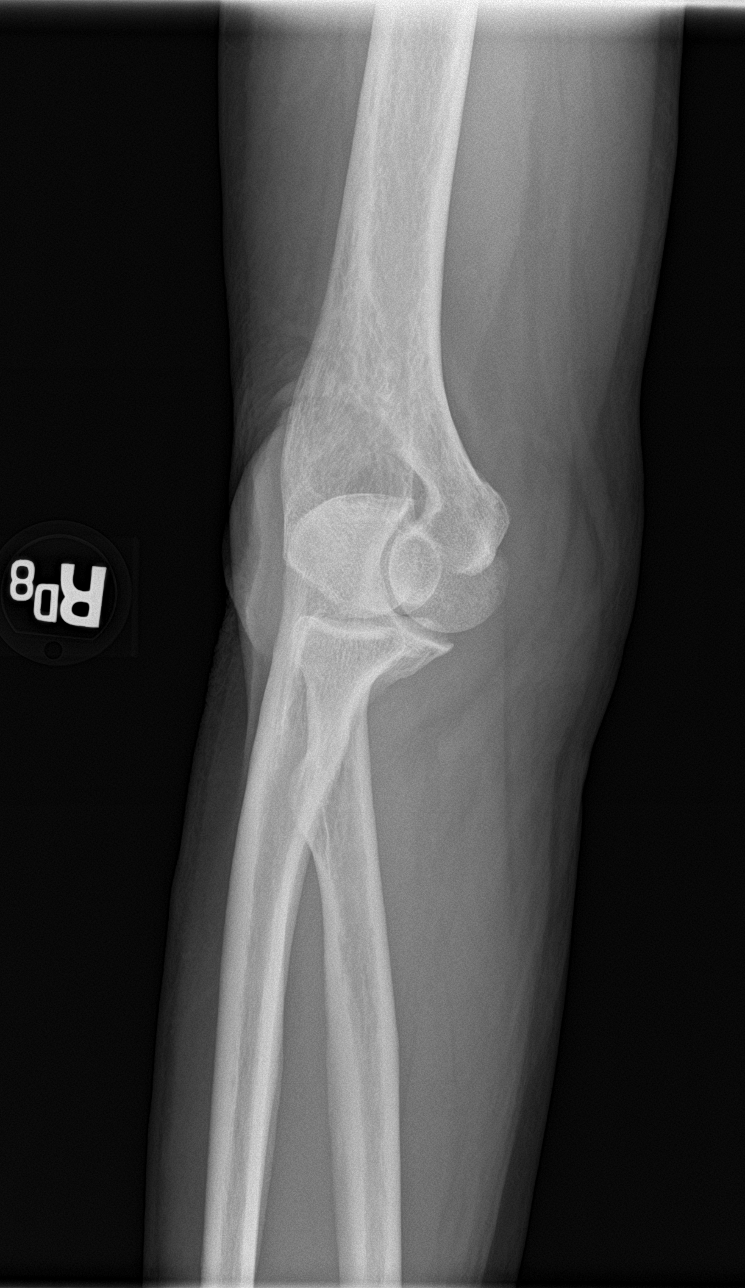
[im 3/4]
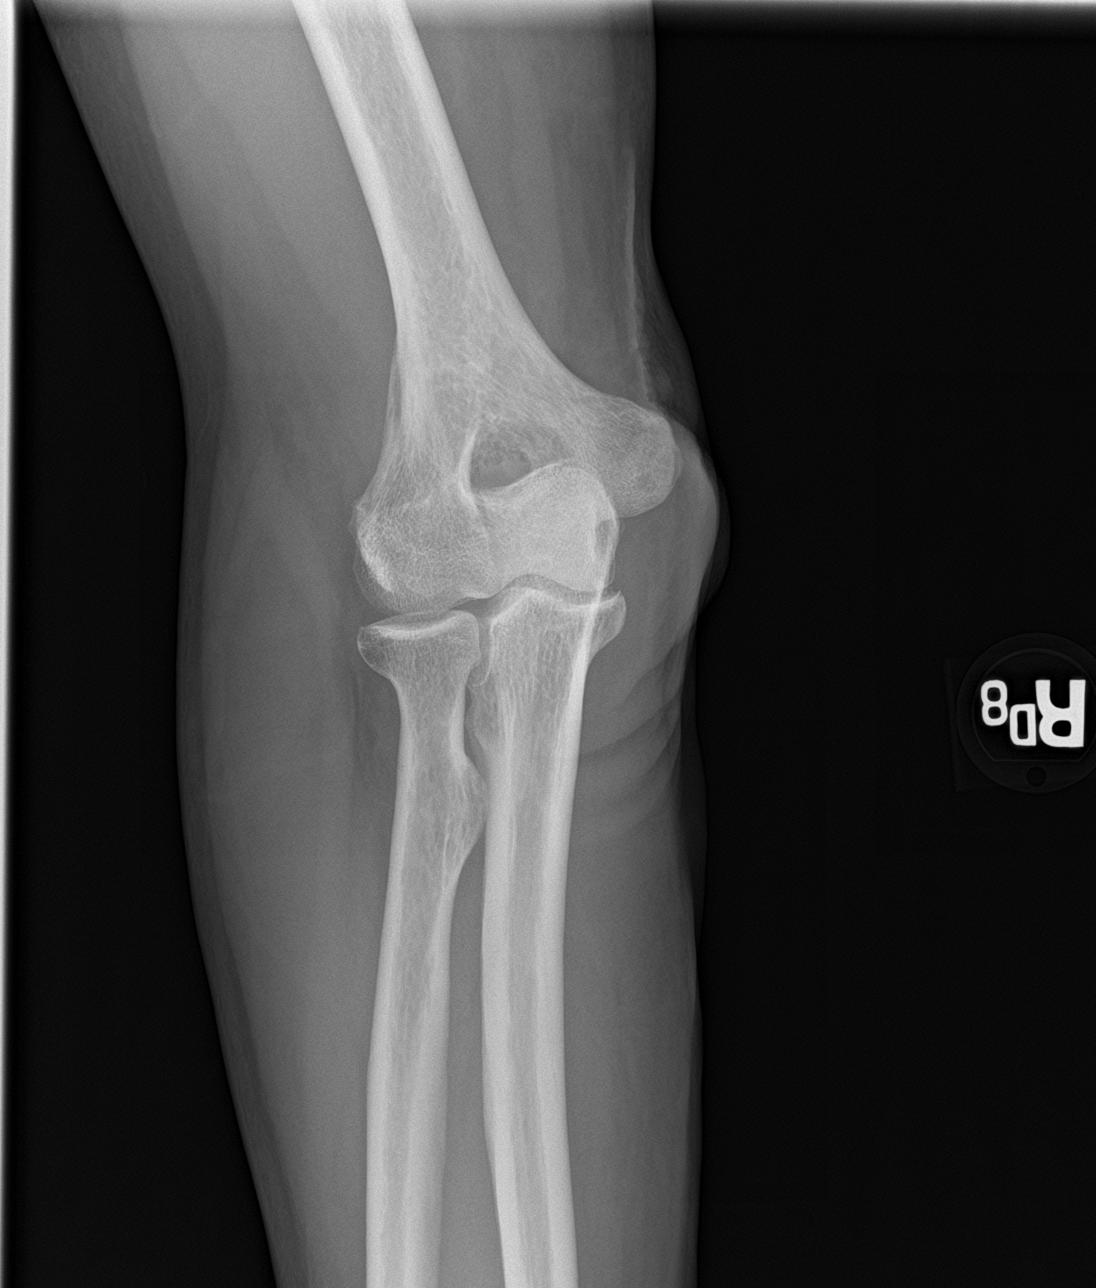
[im 4/4]
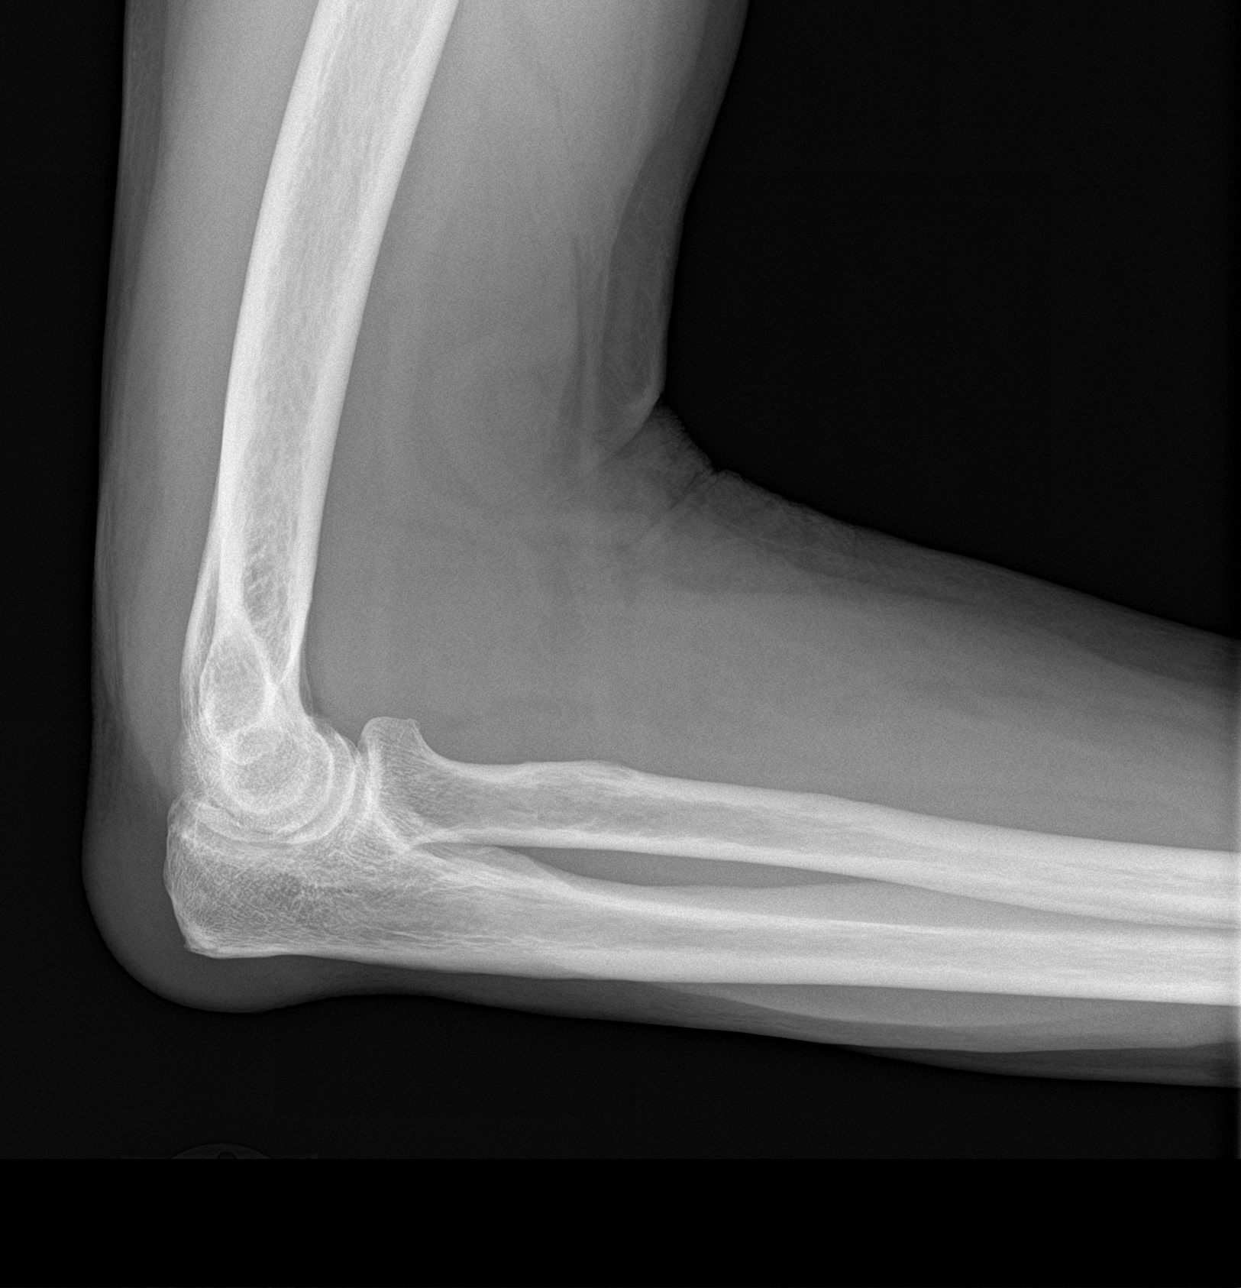

[4 of 4 positions shown; findings below may reference images not displayed]

FINDINGS: There is no evidence of fracture, dislocation, or joint effusion.
There is no evidence of arthropathy or other focal bone abnormality.
Soft tissue prominence about the olecranon process. No radiopaque
foreign bodies.
IMPRESSION: 1. No acute fracture or malalignment.
2. Soft tissue prominence about the olecranon process as could be
seen with olecranon bursitis.

## 2022-05-03 ENCOUNTER — Other Ambulatory Visit: Payer: Self-pay | Admitting: Physician Assistant

## 2022-05-03 DIAGNOSIS — F419 Anxiety disorder, unspecified: Secondary | ICD-10-CM

## 2022-05-09 ENCOUNTER — Other Ambulatory Visit: Payer: Self-pay

## 2022-05-09 DIAGNOSIS — F419 Anxiety disorder, unspecified: Secondary | ICD-10-CM

## 2022-05-09 MED ORDER — ALPRAZOLAM 0.25 MG PO TABS: 0.2500 mg | ORAL_TABLET | Freq: Every evening | ORAL | 1 refills | Status: DC | PRN

## 2022-05-10 DIAGNOSIS — H9312 Tinnitus, left ear: Secondary | ICD-10-CM | POA: Diagnosis not present

## 2022-05-10 DIAGNOSIS — H903 Sensorineural hearing loss, bilateral: Secondary | ICD-10-CM | POA: Diagnosis not present

## 2022-05-25 ENCOUNTER — Other Ambulatory Visit: Payer: Self-pay

## 2022-05-25 NOTE — Telephone Encounter (Signed)
Rec'd paper fax Rx refill for Sertraline from [facility].  [facility] also sent an electronic Rx refill request for Sertraline.  Routed the refill request for Sertraline that was already in Epic to [facility] [employer].  AMD

## 2022-05-26 ENCOUNTER — Ambulatory Visit: Payer: 59

## 2022-05-26 DIAGNOSIS — I255 Ischemic cardiomyopathy: Secondary | ICD-10-CM | POA: Diagnosis not present

## 2022-05-26 DIAGNOSIS — R000 Tachycardia, unspecified: Secondary | ICD-10-CM | POA: Diagnosis not present

## 2022-05-26 DIAGNOSIS — E7800 Pure hypercholesterolemia, unspecified: Secondary | ICD-10-CM | POA: Diagnosis not present

## 2022-05-26 DIAGNOSIS — I2510 Atherosclerotic heart disease of native coronary artery without angina pectoris: Secondary | ICD-10-CM | POA: Diagnosis not present

## 2022-05-26 DIAGNOSIS — I10 Essential (primary) hypertension: Secondary | ICD-10-CM | POA: Diagnosis not present

## 2022-05-26 NOTE — Progress Notes (Signed)
Pt presents today For BP check and o2 stat. Pt may have had an error reading at Cardiologist. Pt requesting a second reading was told o2 read 64. (Possibly read Pulse rate instead of o2)   Pt oxygen is 96. BP 107/72 Pt arrived w/o Shob no wheezing, perfect condition. Just curious to what our readings compared to theirs.

## 2022-05-29 DIAGNOSIS — I2510 Atherosclerotic heart disease of native coronary artery without angina pectoris: Secondary | ICD-10-CM | POA: Diagnosis not present

## 2022-05-29 DIAGNOSIS — R000 Tachycardia, unspecified: Secondary | ICD-10-CM | POA: Diagnosis not present

## 2022-06-02 ENCOUNTER — Other Ambulatory Visit: Payer: Self-pay | Admitting: Physician Assistant

## 2022-06-02 DIAGNOSIS — I10 Essential (primary) hypertension: Secondary | ICD-10-CM

## 2022-06-15 DIAGNOSIS — I2510 Atherosclerotic heart disease of native coronary artery without angina pectoris: Secondary | ICD-10-CM | POA: Diagnosis not present

## 2022-06-15 DIAGNOSIS — R000 Tachycardia, unspecified: Secondary | ICD-10-CM | POA: Diagnosis not present

## 2022-06-16 ENCOUNTER — Other Ambulatory Visit: Payer: Self-pay

## 2022-06-16 DIAGNOSIS — F419 Anxiety disorder, unspecified: Secondary | ICD-10-CM

## 2022-06-16 MED ORDER — ALPRAZOLAM 0.25 MG PO TABS: 0.2500 mg | ORAL_TABLET | Freq: Every evening | ORAL | 1 refills | Status: DC | PRN

## 2022-06-16 NOTE — Telephone Encounter (Signed)
Re: Xanax Refill   Refill for Xanax per patient reviewed with PDMP.  Last refill was on 05/09/2022.  Acceptable for refill.  Sent to [facility].    Durenda Hurt, NP 06/16/2022 8:13 AM

## 2022-06-19 DIAGNOSIS — E1122 Type 2 diabetes mellitus with diabetic chronic kidney disease: Secondary | ICD-10-CM | POA: Diagnosis not present

## 2022-06-19 DIAGNOSIS — I10 Essential (primary) hypertension: Secondary | ICD-10-CM | POA: Diagnosis not present

## 2022-06-19 DIAGNOSIS — N182 Chronic kidney disease, stage 2 (mild): Secondary | ICD-10-CM | POA: Diagnosis not present

## 2022-08-02 ENCOUNTER — Other Ambulatory Visit: Payer: Self-pay

## 2022-08-02 DIAGNOSIS — I10 Essential (primary) hypertension: Secondary | ICD-10-CM

## 2022-08-02 MED ORDER — METOPROLOL TARTRATE 25 MG PO TABS: 25.0000 mg | ORAL_TABLET | Freq: Two times a day (BID) | ORAL | 3 refills | Status: DC

## 2022-08-02 MED ORDER — SPIRONOLACTONE 25 MG PO TABS: 12.5000 mg | ORAL_TABLET | Freq: Every day | ORAL | 3 refills | Status: DC

## 2022-08-02 MED ORDER — LISINOPRIL 10 MG PO TABS: 10.0000 mg | ORAL_TABLET | Freq: Every day | ORAL | 3 refills | Status: DC

## 2022-08-03 DIAGNOSIS — D2262 Melanocytic nevi of left upper limb, including shoulder: Secondary | ICD-10-CM | POA: Diagnosis not present

## 2022-08-03 DIAGNOSIS — D2261 Melanocytic nevi of right upper limb, including shoulder: Secondary | ICD-10-CM | POA: Diagnosis not present

## 2022-08-03 DIAGNOSIS — D2271 Melanocytic nevi of right lower limb, including hip: Secondary | ICD-10-CM | POA: Diagnosis not present

## 2022-08-03 DIAGNOSIS — D2272 Melanocytic nevi of left lower limb, including hip: Secondary | ICD-10-CM | POA: Diagnosis not present

## 2022-08-03 DIAGNOSIS — L570 Actinic keratosis: Secondary | ICD-10-CM | POA: Diagnosis not present

## 2022-08-03 DIAGNOSIS — Z85828 Personal history of other malignant neoplasm of skin: Secondary | ICD-10-CM | POA: Diagnosis not present

## 2022-08-17 ENCOUNTER — Encounter: Payer: Self-pay | Admitting: Physician Assistant

## 2022-08-17 ENCOUNTER — Ambulatory Visit: Payer: 59 | Admitting: Physician Assistant

## 2022-08-17 ENCOUNTER — Other Ambulatory Visit: Payer: Self-pay | Admitting: Physician Assistant

## 2022-08-17 VITALS — BP 144/90 | HR 90 | Temp 98.4°F | Resp 16 | Wt 204.0 lb

## 2022-08-17 DIAGNOSIS — F419 Anxiety disorder, unspecified: Secondary | ICD-10-CM

## 2022-08-17 MED ORDER — CLONAZEPAM 1 MG PO TABS: 1.0000 mg | ORAL_TABLET | Freq: Two times a day (BID) | ORAL | 1 refills | Status: DC | PRN

## 2022-08-17 NOTE — Progress Notes (Signed)
   Subjective: Anxiety    Patient ID: Gabriel Butler, male    DOB: [birth year], 72 y.o.   MRN: [id number]  HPI Patient presents for increased anxiety secondary to acute life stressor which we are trying to keep private.  Patient in the past that he was Xanax for anxiety but he states the medication is not helping at this time.  Patient trying to decrease the stresses that has produces increased anxiety.   Review of Systems Hypertension, coronary artery disease, GERD, diabetes, and anxiety.    Objective:   Physical Exam BP 144/90  Pulse 90  Resp 16  Temp 98.4 F (36.9 C)  SpO2 96 %  Weight 204 lb (92.5 kg)   BMI 25.50 kg/m2  BSA 2.21 m2  Tobacco  Smoking status Never  Smokeless status Never  No acute distress.  Exam was deferred after listening to patient situation.      Assessment & Plan: Anxiety  Patient placed on Klonopin 1 mg twice daily for 10 days.  Patient will follow-up in 5 days for reevaluation.

## 2022-08-17 NOTE — Progress Notes (Deleted)
   Subjective:    Patient ID: Gabriel Butler, male    DOB: [birth year], 72 y.o.   MRN: [id number]  HPI    Review of Systems     Objective:   Physical Exam        Assessment & Plan:

## 2022-08-17 NOTE — Progress Notes (Signed)
Requested appt to speak to provider in private conversation.

## 2022-08-29 DIAGNOSIS — L728 Other follicular cysts of the skin and subcutaneous tissue: Secondary | ICD-10-CM | POA: Diagnosis not present

## 2022-09-14 ENCOUNTER — Other Ambulatory Visit: Payer: Self-pay

## 2022-09-14 DIAGNOSIS — F419 Anxiety disorder, unspecified: Secondary | ICD-10-CM

## 2022-09-14 MED ORDER — CLONAZEPAM 1 MG PO TABS
1.0000 mg | ORAL_TABLET | Freq: Two times a day (BID) | ORAL | 1 refills | Status: AC | PRN
Start: 2022-09-14 — End: ?

## 2022-10-12 DIAGNOSIS — H6123 Impacted cerumen, bilateral: Secondary | ICD-10-CM | POA: Diagnosis not present

## 2022-10-12 DIAGNOSIS — H902 Conductive hearing loss, unspecified: Secondary | ICD-10-CM | POA: Diagnosis not present

## 2022-10-25 ENCOUNTER — Other Ambulatory Visit: Payer: Self-pay | Admitting: Physician Assistant

## 2022-10-25 MED ORDER — ALPRAZOLAM 0.5 MG PO TABS: 0.5000 mg | ORAL_TABLET | Freq: Two times a day (BID) | ORAL | 0 refills | Status: DC | PRN

## 2022-10-25 MED ORDER — PROMETHAZINE-DM 6.25-15 MG/5ML PO SYRP: 5.0000 mL | ORAL_SOLUTION | Freq: Four times a day (QID) | ORAL | 0 refills | Status: DC | PRN

## 2022-11-13 ENCOUNTER — Encounter: Payer: Self-pay | Admitting: Physician Assistant

## 2022-11-13 ENCOUNTER — Ambulatory Visit: Payer: Self-pay | Admitting: Physician Assistant

## 2022-11-13 VITALS — BP 112/77 | HR 65 | Temp 97.6°F | Resp 14 | Ht 75.0 in | Wt 200.0 lb

## 2022-11-13 DIAGNOSIS — J0100 Acute maxillary sinusitis, unspecified: Secondary | ICD-10-CM

## 2022-11-13 MED ORDER — METHYLPREDNISOLONE 4 MG PO TBPK: ORAL_TABLET | ORAL | 0 refills | Status: DC

## 2022-11-13 MED ORDER — AMOXICILLIN-POT CLAVULANATE 875-125 MG PO TABS: 1.0000 | ORAL_TABLET | Freq: Two times a day (BID) | ORAL | 0 refills | Status: DC

## 2022-11-13 NOTE — Progress Notes (Signed)
Pt started having congestion, head ache, pressure in head and ears and drainage along with eyes watering for the past couple weeks.

## 2022-11-13 NOTE — Progress Notes (Signed)
Subjective:     Patient ID: Gabriel Butler, male    DOB: 28-Sep-1950, 72 y.o.   MRN: [id number]  Chief Complaint  Patient presents with   Sinusitis    HPI Patient presents for evaluation of sinus congestion x 2 weeks. Reports increased nasal drainage, nasal congestion and maxillary pressure that began after yard work on [place]. Denies fever, chills, sore throat, and SHOB. Patient with history of sinusitis.   Review of Systems  Constitutional:  Negative for chills, fever and malaise/fatigue.  HENT:  Positive for congestion.   Respiratory: Negative.  Negative for cough and shortness of breath.   Cardiovascular: Negative.   Musculoskeletal: Negative.   Neurological: Negative.       Objective:    BP 112/77   Pulse 65   Temp 97.6 F (36.4 C) (Temporal)   Resp 14   Ht 6\' 3"  (1.905 m)   Wt 200 lb (90.7 kg)   SpO2 95%   BMI 25.00 kg/m    Physical Exam Constitutional:      Appearance: He is not ill-appearing.  HENT:     Nose: Congestion present.     Mouth/Throat:     Mouth: Mucous membranes are moist.     Pharynx: Oropharynx is clear.  Cardiovascular:     Rate and Rhythm: Normal rate and regular rhythm.  Pulmonary:     Effort: Pulmonary effort is normal.     Breath sounds: Normal breath sounds.  Neurological:     General: No focal deficit present.     Mental Status: He is alert. Mental status is at baseline.  Psychiatric:        Mood and Affect: Mood normal.        Behavior: Behavior normal.        Assessment & Plan:   Problem List Items Addressed This Visit   None  Patient prescribed Amoxicillin, Allegra D, and [place] Dose pack. Advised to follow up with clinic in 10 days if symptoms not resolved, or with any concerns or needs.   Delma Post, RN

## 2022-11-29 DIAGNOSIS — H1010 Acute atopic conjunctivitis, unspecified eye: Secondary | ICD-10-CM | POA: Diagnosis not present

## 2022-11-29 DIAGNOSIS — J300 Vasomotor rhinitis: Secondary | ICD-10-CM | POA: Diagnosis not present

## 2022-12-25 ENCOUNTER — Other Ambulatory Visit: Payer: 59

## 2022-12-25 DIAGNOSIS — R7309 Other abnormal glucose: Secondary | ICD-10-CM

## 2022-12-25 DIAGNOSIS — E1122 Type 2 diabetes mellitus with diabetic chronic kidney disease: Secondary | ICD-10-CM | POA: Diagnosis not present

## 2022-12-25 DIAGNOSIS — I10 Essential (primary) hypertension: Secondary | ICD-10-CM | POA: Diagnosis not present

## 2022-12-26 LAB — HGB A1C W/O EAG: Hgb A1c MFr Bld: 6.8 % — ABNORMAL HIGH (ref 4.8–5.6)

## 2022-12-28 ENCOUNTER — Other Ambulatory Visit: Payer: Self-pay | Admitting: Physician Assistant

## 2023-01-10 ENCOUNTER — Other Ambulatory Visit: Payer: Self-pay | Admitting: Physician Assistant

## 2023-01-10 MED ORDER — ALPRAZOLAM 0.5 MG PO TABS: 0.5000 mg | ORAL_TABLET | Freq: Two times a day (BID) | ORAL | 0 refills | Status: DC | PRN

## 2023-03-07 ENCOUNTER — Other Ambulatory Visit: Payer: Self-pay | Admitting: Physician Assistant

## 2023-03-07 DIAGNOSIS — F419 Anxiety disorder, unspecified: Secondary | ICD-10-CM

## 2023-03-13 ENCOUNTER — Ambulatory Visit: Payer: Self-pay

## 2023-03-13 DIAGNOSIS — Z0000 Encounter for general adult medical examination without abnormal findings: Secondary | ICD-10-CM

## 2023-03-13 DIAGNOSIS — E119 Type 2 diabetes mellitus without complications: Secondary | ICD-10-CM

## 2023-03-13 LAB — POCT URINALYSIS DIPSTICK
Bilirubin, UA: NEGATIVE
Blood, UA: NEGATIVE
Glucose, UA: POSITIVE — AB
Ketones, UA: NEGATIVE
Leukocytes, UA: NEGATIVE
Nitrite, UA: NEGATIVE
Protein, UA: NEGATIVE
Spec Grav, UA: 1.015 (ref 1.010–1.025)
Urobilinogen, UA: 0.2 U/dL
pH, UA: 6 (ref 5.0–8.0)

## 2023-03-13 NOTE — Progress Notes (Deleted)
PT COMPLETED LABS FOR PHYSICAL. Gabriel Butler

## 2023-03-13 NOTE — Progress Notes (Signed)
Scheduled to complete physical 03/20/2023 at 8:45 am with  Nona Dell, PA-C.

## 2023-03-15 LAB — CMP12+LP+TP+TSH+6AC+PSA+CBC…
ALT: 35 [IU]/L (ref 0–44)
AST: 27 [IU]/L (ref 0–40)
Albumin: 4.2 g/dL (ref 3.8–4.8)
Alkaline Phosphatase: 73 [IU]/L (ref 44–121)
BUN/Creatinine Ratio: 15 (ref 10–24)
BUN: 17 mg/dL (ref 8–27)
Basophils Absolute: 0 10*3/uL (ref 0.0–0.2)
Basos: 1 %
Bilirubin Total: 0.4 mg/dL (ref 0.0–1.2)
Calcium: 9.5 mg/dL (ref 8.6–10.2)
Chloride: 105 mmol/L (ref 96–106)
Chol/HDL Ratio: 2.5 {ratio} (ref 0.0–5.0)
Cholesterol, Total: 105 mg/dL (ref 100–199)
Creatinine, Ser: 1.16 mg/dL (ref 0.76–1.27)
EOS (ABSOLUTE): 0.1 10*3/uL (ref 0.0–0.4)
Eos: 2 %
Estimated CHD Risk: <0.5 times avg. (ref 0.0–1.0)
Free Thyroxine Index: 0.9 — ABNORMAL LOW (ref 1.2–4.9)
GGT: 21 [IU]/L (ref 0–65)
Globulin, Total: 2 g/dL (ref 1.5–4.5)
Glucose: 117 mg/dL — ABNORMAL HIGH (ref 70–99)
HDL: 42 mg/dL (ref >39)
Hematocrit: 46.4 % (ref 37.5–51.0)
Hemoglobin: 15.8 g/dL (ref 13.0–17.7)
Immature Grans (Abs): 0 10*3/uL (ref 0.0–0.1)
Immature Granulocytes: 0 %
Iron: 92 ug/dL (ref 38–169)
LDH: 206 [IU]/L (ref 121–224)
LDL Chol Calc (NIH): 48 mg/dL (ref 0–99)
Lymphocytes Absolute: 1.2 10*3/uL (ref 0.7–3.1)
Lymphs: 27 %
MCH: 31.7 pg (ref 26.6–33.0)
MCHC: 34.1 g/dL (ref 31.5–35.7)
MCV: 93 fL (ref 79–97)
Monocytes Absolute: 0.4 10*3/uL (ref 0.1–0.9)
Monocytes: 9 %
Neutrophils Absolute: 2.9 10*3/uL (ref 1.4–7.0)
Neutrophils: 61 %
Phosphorus: 3.4 mg/dL (ref 2.8–4.1)
Platelets: 197 10*3/uL (ref 150–450)
Potassium: 4.6 mmol/L (ref 3.5–5.2)
Prostate Specific Ag, Serum: 2.4 ng/mL (ref 0.0–4.0)
RBC: 4.99 x10E6/uL (ref 4.14–5.80)
RDW: 12.4 % (ref 11.6–15.4)
Sodium: 140 mmol/L (ref 134–144)
T3 Uptake Ratio: 21 % — ABNORMAL LOW (ref 24–39)
T4, Total: 4.2 ug/dL — ABNORMAL LOW (ref 4.5–12.0)
TSH: 1.07 u[IU]/mL (ref 0.450–4.500)
Total Protein: 6.2 g/dL (ref 6.0–8.5)
Triglycerides: 73 mg/dL (ref 0–149)
Uric Acid: 4 mg/dL (ref 3.8–8.4)
VLDL Cholesterol Cal: 15 mg/dL (ref 5–40)
WBC: 4.6 10*3/uL (ref 3.4–10.8)
eGFR: 67 mL/min/{1.73_m2} (ref >59)

## 2023-03-15 LAB — HGB A1C W/O EAG: Hgb A1c MFr Bld: 6.6 % — ABNORMAL HIGH (ref 4.8–5.6)

## 2023-03-15 LAB — MICROALBUMIN / CREATININE URINE RATIO
Creatinine, Urine: 96.1 mg/dL
Microalb/Creat Ratio: 6 mg/g{creat} (ref 0–29)
Microalbumin, Urine: 6.2 ug/mL

## 2023-03-20 ENCOUNTER — Encounter: Payer: 59 | Admitting: Physician Assistant

## 2023-03-22 ENCOUNTER — Ambulatory Visit: Payer: Self-pay | Admitting: Physician Assistant

## 2023-03-22 ENCOUNTER — Encounter: Payer: Self-pay | Admitting: Physician Assistant

## 2023-03-22 VITALS — BP 114/76 | HR 73 | Temp 97.8°F | Resp 16 | Wt 196.4 lb

## 2023-03-22 DIAGNOSIS — Z0000 Encounter for general adult medical examination without abnormal findings: Secondary | ICD-10-CM

## 2023-03-22 NOTE — Progress Notes (Signed)
Here for yearly physical with provider and no complaints voiced to nurse.

## 2023-03-22 NOTE — Progress Notes (Signed)
[facility] ____________________________________________   None    (approximate)  I have reviewed the triage vital signs and the nursing notes.   HISTORY  Chief Complaint No chief complaint on file.   HPI Gabriel Butler is a 73 y.o. male          Past Medical History:  Diagnosis Date   GERD (gastroesophageal reflux disease)    Heart attack (HCC) 03/05/2013   Coronary stent placement.   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   Chronic kidney disease 11/26/2019   Disorder of bursae of shoulder region 12/11/2018   Contusion of shoulder region 12/11/2018   Right inguinal hernia 03/20/2014   Essential hypertension 05/15/2013   Ischemic cardiomyopathy 03/14/2013   Pure hypercholesterolemia 03/14/2013   Coronary artery disease involving native coronary artery of native heart without angina pectoris 03/07/2013   Diabetes mellitus (HCC) 03/07/2013   GERD (gastroesophageal reflux disease) 03/07/2013   STEMI (ST elevation myocardial infarction) (HCC) 03/07/2013   cardiac cath 2013   Disorder of intestine 11/21/2010   History of colonic polyps 11/21/2010    Past Surgical History:  Procedure Laterality Date   COLONOSCOPY  2013   CORONARY ANGIOPLASTY WITH STENT PLACEMENT     HERNIA REPAIR Right 03/26/14   Direct and indirect hernia noted, large ultra Pro mesh utilized.    Prior to Admission medications   Medication Sig Start Date End Date Taking? Authorizing Provider  ALPRAZolam Prudy Feeler) 0.5 MG tablet Take 1 tablet (0.5 mg total) by mouth 2 (two) times daily as needed for anxiety. 01/10/23   Joni Reining, PA-C  amoxicillin-clavulanate (AUGMENTIN) 875-125 MG tablet Take 1 tablet by mouth 2 (two) times daily. 11/13/22   Joni Reining, PA-C  aspirin 81 MG EC tablet Take 81 mg by mouth daily. Swallow whole.    [provider]  atorvastatin (LIPITOR) 80 MG tablet TAKE ONE TABLET BY MOUTH AT BEDTIME 02/03/20   Joni Reining, PA-C  clonazePAM (KLONOPIN) 1 MG tablet Take 1 tablet (1 mg total) by mouth 2 (two) times daily as needed for anxiety. 09/14/22   Joni Reining, PA-C  clopidogrel (PLAVIX) 75 MG tablet Take 1 tablet by mouth daily. 11/03/18   [provider]  clotrimazole-betamethasone (LOTRISONE) cream APPLY TO AFFECTED AREAS DAILY AS DIRECTED 10/16/18   Jamelle Haring, MD  diclofenac sodium (VOLTAREN) 1 % GEL Apply 4 g topically 4 (four) times daily. 07/12/15   Hyatt, Max T, DPM  empagliflozin (JARDIANCE) 10 MG TABS tablet  05/03/20   [provider]  ezetimibe (ZETIA) 10 MG tablet Take 1 tablet by mouth daily. 11/03/18   [provider]  fluticasone (FLONASE) 50 MCG/ACT nasal spray Place 2 sprays into both nostrils daily. 11/29/21   Joni Reining, PA-C  ipratropium (ATROVENT) 0.06 % nasal spray  04/10/17   [provider]  isosorbide mononitrate (IMDUR) 30 MG 24 hr tablet     [provider]  lisinopril (ZESTRIL) 10 MG tablet Take 1 tablet (10 mg total) by mouth daily. 08/02/22   Tommi Rumps, PA-C  methylPREDNISolone (MEDROL DOSEPAK) 4 MG TBPK tablet Take Tapered dose as directed 11/13/22   Joni Reining, PA-C  metoprolol succinate (TOPROL-XL) 50 MG 24 hr tablet TAKE ONE TABLET BY MOUTH TWICE DAILY 05/04/20   Joni Reining, PA-C  metoprolol tartrate (  LOPRESSOR) 25 MG tablet Take 1 tablet (25 mg total) by mouth 2 (two) times daily. 08/02/22   Tommi Rumps, PA-C  metroNIDAZOLE (METROGEL) 1 % gel APPLY TO AFFECTED AREAS EVERY DAY 08/25/19   Willeen Niece, MD  mupirocin ointment (BACTROBAN) 2 % Apply 1 application. topically daily. 11/21/18   [provider]  nitroGLYCERIN (NITROSTAT) 0.4 MG SL tablet Take 1 tablet by mouth daily as needed. 04/27/17   [provider]  pantoprazole (PROTONIX) 40 MG tablet Take one tablet once to twice daily  prn 10/20/19   Joni Reining, PA-C  promethazine-dextromethorphan (PROMETHAZINE-DM) 6.25-15 MG/5ML syrup Take 5 mLs by mouth 4 (four) times daily as needed for cough. 10/25/22   Joni Reining, PA-C  sertraline (ZOLOFT) 50 MG tablet TAKE THREE TABLETS BY MOUTH EVERY DAY 03/07/23   Ivar Drape, PA-C  spironolactone (ALDACTONE) 25 MG tablet Take 0.5 tablets (12.5 mg total) by mouth daily. 08/02/22   Tommi Rumps, PA-C  traZODone (DESYREL) 50 MG tablet Take 1 to 2 tablets po hs prn for insomnia 10/20/19   Joni Reining, PA-C    Allergies Bee pollen, Other, Pollen extract, and Pseudoephedrine-naproxen na er  Family History  Problem Relation Age of Onset   Lung cancer Father    Skin cancer Brother     Social History Social History   Tobacco Use   Smoking status: Never   Smokeless tobacco: Never  Substance Use Topics   Alcohol use: Yes    Alcohol/week: 0.0 standard drinks of alcohol   Drug use: No    Review of Systems  Constitutional: No fever/chills Eyes: No visual changes. ENT: No sore throat. Cardiovascular: Denies chest pain. Respiratory: Denies shortness of breath. Gastrointestinal: No abdominal pain.  No nausea, no vomiting.  No diarrhea.  No constipation. GERD Genitourinary: Negative for dysuria. Musculoskeletal: Negative for back pain. Skin: Negative for rash. Neurological: Negative for headaches, focal weakness or numbness. Endocrine: Diabetes, hyperlipidemia, and hypertension Allergic/Immunilogical: Bee pollen, Sudafed, and naproxen  ____________________________________________   PHYSICAL EXAM:  VITAL SIGNS: BP 114/76  Pulse Rate 73  Temp 97.8 F (36.6 C)  Resp 16  SpO2 96 %   Constitutional: Alert and oriented. Well appearing and in no acute distress. Eyes: Conjunctivae are normal. PERRL. EOMI. Head: Atraumatic. Nose: No congestion/rhinnorhea. Mouth/Throat: Mucous membranes are moist.  Oropharynx non-erythematous. Neck: No stridor.  No cervical  spine tenderness to palpation. Hematological/Lymphatic/Immunilogical: No cervical lymphadenopathy. Cardiovascular: Normal rate, regular rhythm. Grossly normal heart sounds.  Good peripheral circulation. Respiratory: Normal respiratory effort.  No retractions. Lungs CTAB. Gastrointestinal: Soft and nontender. No distention. No abdominal bruits. No CVA tenderness. Genitourinary: Deferred Musculoskeletal: No lower extremity tenderness nor edema.  No joint effusions. Neurologic:  Normal speech and language. No gross focal neurologic deficits are appreciated. No gait instability. Skin:  Skin is warm, dry and intact. No rash noted. Psychiatric: Mood and affect are normal. Speech and behavior are normal.  ____________________________________________   LABS         Component Ref Range & Units (hover) 9 d ago 12 mo ago 2 yr ago 3 yr ago 4 yr ago  Color, UA Yellow dark yellow VC Dark Yellow yellow yellow  Clarity, UA Clear clear VC Clear clear clear  Glucose, UA Positive Abnormal  Positive Abnormal  VC, CM Positive Abnormal  CM Positive Abnormal  CM Negative  Comment: 2+  Bilirubin, UA Negative neg VC Negativ e negative negative  Ketones, UA Negative neg Negative negative negative  Spec Grav, UA 1.015 1.015 VC 1.015 1.015 1.020  Blood, UA Negative neg Negative negative negative  pH, UA 6.0 6.0 6.0 6.0 6.0  Protein, UA Negative Positive Abnormal  VC, CM Negative Negative Negative  Urobilinogen, UA 0.2 0.2 0.2 0.2 0.2  Nitrite, UA Negative neg Negative negative negative  Leukocytes, UA Negative Negative Abnormal  VC, CM Negative Negative Negative  Appearance  dark VC  light   Odor                  View All Conversations on this Encounter               Component Ref Range & Units (hover) 9 d ago (03/13/23) 11 mo ago (04/05/22) 12 mo ago (03/24/22) 2 yr ago (03/21/21) 3 yr ago (03/19/20) 3 yr ago (08/20/19) 4 yr ago (03/10/19)  Glucose 117 High  111 High  150 High  103 High  109 High  R   113 High  R  Uric Acid 4.0  4.8 CM 4.2 CM 4.9 CM  4.9 CM  Comment:            Therapeutic target for gout patients: <6.0  BUN 17 20 19 22 19 22 18   Creatinine, Ser 1.16 1.04 1.25 1.03 1.14 1.46 High  1.18  eGFR 67 77 62 79     BUN/Creatinine Ratio 15 19 15 21 17 15 15   Sodium 140 141 140 138 137  140  Potassium 4.6 4.9 4.4 4.9 4.6  5.0  Chloride 105 107 High  105 103 100  103  Calcium 9.5 9.2 9.8 9.4 9.6  9.3  Phosphorus 3.4  3.8 3.7 4.0  3.9  Total Protein 6.2  6.6 6.3 6.9  6.4  Albumin 4.2  4.2 R 4.1 4.6  4.2  Globulin, Total 2.0  2.4 2.2 2.3  2.2  Bilirubin Total 0.4  0.6 0.3 0.5  0.4  Alkaline Phosphatase 73  79 75 82  78 R  LDH 206  189 197 219  245 High   AST 27  20 26 28  30   ALT 35  30 32 36  34  GGT 21  24 28 26  21   Iron 92  83 97 105  80  Cholesterol, Total 105  108 198 109  98 Low   Triglycerides 73  59 113 64  55  HDL 42  48 49 49  39 Low   VLDL Cholesterol Cal 15  13 20 14  13   LDL Chol Calc (NIH) 48  47 129 High  46  46  Chol/HDL Ratio 2.5  2.3 CM 4.0 CM 2.2 CM  2.5 CM  Comment:                                   T. Chol/HDL Ratio                                             Men  Women                               1/2 Avg.Risk  3.4    3.3  Avg.Risk  5.0    4.4                                2X Avg.Risk  9.6    7.1                                3X Avg.Risk 23.4   11.0  Estimated CHD Risk  < 0.5   < 0.5 CM 0.8 CM  < 0.5 CM   < 0.5 CM  Comment: The CHD Risk is based on the T. Chol/HDL ratio. Other factors affect CHD Risk such as hypertension, smoking, diabetes, severe obesity, and family history of premature CHD.  TSH 1.070  1.050 1.850 1.240  1.410  T4, Total 4.2 Low   5.2 4.7 5.6  4.8  T3 Uptake Ratio 21 Low   21 Low  20 Low  22 Low   19 Low   Free Thyroxine Index 0.9 Low   1.1 Low  0.9 Low  1.2  0.9 Low   Prostate Specific Ag, Serum 2.4  3.4 CM 3.1 CM 2.9 CM  1.8 CM  Comment: Roche ECLIA methodology. According to the  American Urological Association, Serum PSA should decrease and remain at undetectable levels after radical prostatectomy. The AUA defines biochemical recurrence as an initial PSA value 0.2 ng/mL or greater followed by a subsequent confirmatory PSA value 0.2 ng/mL or greater. Values obtained with different assay methods or kits cannot be used interchangeably. Results cannot be interpreted as absolute evidence of the presence or absence of malignant disease.  WBC 4.6  5.4 4.2 6.3  5.1  RBC 4.99  5.25 5.13 5.58  5.06  Hemoglobin 15.8  16.3 16.1 17.4  15.8  Hematocrit 46.4  48.4 46.9 51.5 High   45.3  MCV 93  92 91 92  90  MCH 31.7  31.0 31.4 31.2  31.2  MCHC 34.1  33.7 34.3 33.8  34.9  RDW 12.4  12.5 12.4 12.7  12.2  Platelets 197  188 207 236  208  Neutrophils 61  68 56 69  59  Lymphs 27  18 31 22  29   Monocytes 9  11 10 7  9   Eos 2  2 2 1  2   Basos 1  1 1 1  1   Neutrophils Absolute 2.9  3.8 2.3 4.4  3.1  Lymphocytes Absolute 1.2  1.0 1.3 1.4  1.5  Monocytes Absolute 0.4  0.6 0.4 0.5  0.5  EOS (ABSOLUTE) 0.1  0.1 0.1 0.1  0.1  Basophils Absolute 0.0  0.0 0.1 0.0  0.1  Immature Granulocytes 0  0 0 0  0  Immature Grans (Abs) 0.0  0.0 0.0 0.0  0.0             ____________________________________________     ____________________________________________    ____________________________________________   INITIAL IMPRESSION / ASSESSMENT AND PLAN  As part of my medical decision making, I reviewed the following data within the electronic MEDICAL RECORD NUMBER      No acute findings on physical exam.  Labs continue to show decreased levels of T4 and T3.  TSH in normal range.        ____________________________________________   FINAL CLINICAL IMPRESSION Well exam   ED Discharge Orders     None        Note:  This document  was prepared using Conservation officer, historic buildings and may include unintentional dictation errors.

## 2023-03-28 DIAGNOSIS — H524 Presbyopia: Secondary | ICD-10-CM | POA: Diagnosis not present

## 2023-04-05 DIAGNOSIS — D225 Melanocytic nevi of trunk: Secondary | ICD-10-CM | POA: Diagnosis not present

## 2023-04-05 DIAGNOSIS — D485 Neoplasm of uncertain behavior of skin: Secondary | ICD-10-CM | POA: Diagnosis not present

## 2023-04-05 DIAGNOSIS — Z85828 Personal history of other malignant neoplasm of skin: Secondary | ICD-10-CM | POA: Diagnosis not present

## 2023-04-05 DIAGNOSIS — D2272 Melanocytic nevi of left lower limb, including hip: Secondary | ICD-10-CM | POA: Diagnosis not present

## 2023-04-05 DIAGNOSIS — D2261 Melanocytic nevi of right upper limb, including shoulder: Secondary | ICD-10-CM | POA: Diagnosis not present

## 2023-04-05 DIAGNOSIS — D2262 Melanocytic nevi of left upper limb, including shoulder: Secondary | ICD-10-CM | POA: Diagnosis not present

## 2023-04-05 DIAGNOSIS — C44321 Squamous cell carcinoma of skin of nose: Secondary | ICD-10-CM | POA: Diagnosis not present

## 2023-04-05 DIAGNOSIS — L570 Actinic keratosis: Secondary | ICD-10-CM | POA: Diagnosis not present

## 2023-04-11 ENCOUNTER — Other Ambulatory Visit: Payer: Self-pay | Admitting: Physician Assistant

## 2023-04-11 DIAGNOSIS — F419 Anxiety disorder, unspecified: Secondary | ICD-10-CM

## 2023-04-12 DIAGNOSIS — H6123 Impacted cerumen, bilateral: Secondary | ICD-10-CM | POA: Diagnosis not present

## 2023-04-12 DIAGNOSIS — H902 Conductive hearing loss, unspecified: Secondary | ICD-10-CM | POA: Diagnosis not present

## 2023-05-15 ENCOUNTER — Encounter: Payer: Self-pay | Admitting: Physician Assistant

## 2023-05-15 ENCOUNTER — Ambulatory Visit: Payer: Self-pay | Admitting: Physician Assistant

## 2023-05-15 VITALS — BP 113/77 | HR 60 | Temp 97.1°F | Resp 58

## 2023-05-15 DIAGNOSIS — F439 Reaction to severe stress, unspecified: Secondary | ICD-10-CM

## 2023-05-15 NOTE — Progress Notes (Signed)
   Subjective: Stress    Patient ID: Gabriel Butler, male    DOB: 08/26/1950, 73 y.o.   MRN: [id number]  HPI Patient complain of increased stress.  Patient is currently retired but works part-time for [employer] with conflicting schedules at home.  Patient stated also experiencing short-term memory loss secondary to stress.  Patient states he becomes anxious midmorning while working at home.   Review of Systems CAD hypertension, GERD, and diabetes.    Objective:   Physical Exam BP 113/77  BP Location Left Arm  Patient Position Sitting  Cuff Size Large  Pulse Rate 60  Temp 97.1 F (36.2 C)Temp. 97.1 F (36.2 C). Data is abnormal. Taken on 05/15/23 10:48 AM  Resp 58Resp. 58. Data is abnormal. Taken on 05/15/23 10:48 AM  SpO2 98 %  The rest of physical exam was deferred.       Assessment & Plan: Stress  Care discussed with patient he will start Xanax 0.25 mg after breakfast.  Patient also advised to keep notes and purchase a voice recorder of calendar/alarm.  Advised to follow-up as needed.

## 2023-06-13 DIAGNOSIS — C44321 Squamous cell carcinoma of skin of nose: Secondary | ICD-10-CM | POA: Diagnosis not present

## 2023-06-28 DIAGNOSIS — H6123 Impacted cerumen, bilateral: Secondary | ICD-10-CM | POA: Diagnosis not present

## 2023-06-28 DIAGNOSIS — H903 Sensorineural hearing loss, bilateral: Secondary | ICD-10-CM | POA: Diagnosis not present

## 2023-07-05 ENCOUNTER — Other Ambulatory Visit: Payer: Self-pay

## 2023-07-05 DIAGNOSIS — I10 Essential (primary) hypertension: Secondary | ICD-10-CM

## 2023-07-05 MED ORDER — SPIRONOLACTONE 25 MG PO TABS: 12.5000 mg | ORAL_TABLET | Freq: Every day | ORAL | 3 refills | Status: AC

## 2023-07-05 MED ORDER — METOPROLOL TARTRATE 25 MG PO TABS: 25.0000 mg | ORAL_TABLET | Freq: Two times a day (BID) | ORAL | 3 refills | Status: AC

## 2023-07-05 MED ORDER — LISINOPRIL 10 MG PO TABS: 10.0000 mg | ORAL_TABLET | Freq: Every day | ORAL | 3 refills | Status: AC

## 2023-07-06 ENCOUNTER — Other Ambulatory Visit: Payer: Self-pay | Admitting: Physician Assistant

## 2023-07-06 DIAGNOSIS — G4700 Insomnia, unspecified: Secondary | ICD-10-CM

## 2023-07-19 ENCOUNTER — Encounter: Payer: Self-pay | Admitting: Physician Assistant

## 2023-07-19 ENCOUNTER — Ambulatory Visit: Payer: Self-pay | Admitting: Physician Assistant

## 2023-07-19 VITALS — BP 105/77 | HR 88 | Temp 100.5°F | Resp 16

## 2023-07-19 DIAGNOSIS — R509 Fever, unspecified: Secondary | ICD-10-CM

## 2023-07-19 DIAGNOSIS — J322 Chronic ethmoidal sinusitis: Secondary | ICD-10-CM

## 2023-07-19 LAB — POC COVID19 BINAXNOW: SARS Coronavirus 2 Ag: NEGATIVE

## 2023-07-19 LAB — POCT INFLUENZA A/B
Influenza A, POC: NEGATIVE
Influenza B, POC: NEGATIVE

## 2023-07-19 MED ORDER — FEXOFENADINE HCL 60 MG PO TABS: 60.0000 mg | ORAL_TABLET | Freq: Two times a day (BID) | ORAL | 0 refills | Status: DC

## 2023-07-19 MED ORDER — AMOXICILLIN 875 MG PO TABS
875.0000 mg | ORAL_TABLET | Freq: Two times a day (BID) | ORAL | 0 refills | Status: AC
Start: 2023-07-19 — End: 2023-07-29

## 2023-07-19 NOTE — Progress Notes (Signed)
 Reports feeling increasingly worse today after traveling and fever at home 101.  Stated sinusitis and congestion, but not using anything right now.  Stated he follows with cardiology.  No dyspnea reported.

## 2023-07-19 NOTE — Progress Notes (Signed)
   Subjective: Sinus congestion    Patient ID: Gabriel Butler, male    DOB: 1950-06-03, 73 y.o.   MRN: [id number]  HPI Patient presents with sinus congestion for 1 week.  Patient also complaining of facial pain.  Patient denies recent travel or known contact with COVID-19.  Patient tested negative for COVID-19 and influenza in the clinic today.   Review of Systems Allergic rhinitis, chronic kidney disease, diabetes, GERD, and hypertension.    Objective:   Physical Exam BP 105/77  Pulse Rate 88  Temp 100.5 F (38.1 C)  Resp 16  SpO2 96 %  Febrile HEENT is remarkable for edematous nasal turbinates and bilateral maxillary guarding.  Postnasal drainage. Neck is supple for lymphadenopathy or bruits. Lungs are clear to auscultation. Heart is regular rate and rhythm.      Assessment & Plan:

## 2023-07-20 DIAGNOSIS — I2510 Atherosclerotic heart disease of native coronary artery without angina pectoris: Secondary | ICD-10-CM | POA: Diagnosis not present

## 2023-07-20 DIAGNOSIS — I10 Essential (primary) hypertension: Secondary | ICD-10-CM | POA: Diagnosis not present

## 2023-07-20 DIAGNOSIS — I255 Ischemic cardiomyopathy: Secondary | ICD-10-CM | POA: Diagnosis not present

## 2023-07-20 DIAGNOSIS — E7800 Pure hypercholesterolemia, unspecified: Secondary | ICD-10-CM | POA: Diagnosis not present

## 2023-07-23 DIAGNOSIS — N182 Chronic kidney disease, stage 2 (mild): Secondary | ICD-10-CM | POA: Diagnosis not present

## 2023-07-23 DIAGNOSIS — I10 Essential (primary) hypertension: Secondary | ICD-10-CM | POA: Diagnosis not present

## 2023-07-23 DIAGNOSIS — E1122 Type 2 diabetes mellitus with diabetic chronic kidney disease: Secondary | ICD-10-CM | POA: Diagnosis not present

## 2023-08-24 ENCOUNTER — Other Ambulatory Visit: Payer: Self-pay | Admitting: Physician Assistant

## 2023-08-24 DIAGNOSIS — F419 Anxiety disorder, unspecified: Secondary | ICD-10-CM

## 2023-09-10 ENCOUNTER — Encounter: Payer: Self-pay | Admitting: Physician Assistant

## 2023-09-10 ENCOUNTER — Ambulatory Visit: Payer: Self-pay | Admitting: Physician Assistant

## 2023-09-10 VITALS — BP 120/86 | HR 63 | Temp 97.5°F | Resp 16

## 2023-09-10 DIAGNOSIS — J320 Chronic maxillary sinusitis: Secondary | ICD-10-CM

## 2023-09-10 MED ORDER — CETIRIZINE HCL 10 MG PO TABS: 10.0000 mg | ORAL_TABLET | Freq: Every day | ORAL | 0 refills | Status: DC

## 2023-09-10 NOTE — Progress Notes (Signed)
 Reports about 2 days of sinusitis without any fever or c/o cough.  Reports concern of sinus infection and facial sinus pressure/headache the biggest concern.

## 2023-09-10 NOTE — Addendum Note (Signed)
 Addended by: ELUTERIO JENKINS HERO on: 09/10/2023 04:07 PM   Modules accepted: Orders

## 2023-09-10 NOTE — Progress Notes (Signed)
   Subjective: Chronic sinusitis    Patient ID: Gabriel Butler, male    DOB: [birth year], 73 y.o.   MRN: [id number]  HPI Patient complaining of 10+ years of chronic sinusitis.  State complaint waxing waning with copious postnasal drainage and sinus congestion.  Patient  use numerous antihistamines, steroids and decongestants.  Patient has never addressed this with his ENT doctor.  Stated he normally sees ENT for ringing in his ears.  States mild transient relief with medication but symptoms always recur.    Review of Systems Diabetes, GERD, hypertension, and osteoarthritis.    Objective:   Physical Exam  BP 120/86  Pulse Rate 63  Temp 97.5 F (36.4 C)  Resp 16  SpO2 96 %  No acute distress. HEENT is remarkable for edematous nasal turbinates and postnasal drainage.  Bilateral maxillary and frontal guarding with palpation.      Assessment & Plan: Chronic sinusitis  Advised Zyrtec 's pending CT scan of sinuses.  Post results will be sent to ENT.

## 2023-09-12 DIAGNOSIS — J329 Chronic sinusitis, unspecified: Secondary | ICD-10-CM | POA: Diagnosis not present

## 2023-09-12 DIAGNOSIS — R0981 Nasal congestion: Secondary | ICD-10-CM | POA: Diagnosis not present

## 2023-09-28 DIAGNOSIS — J329 Chronic sinusitis, unspecified: Secondary | ICD-10-CM | POA: Diagnosis not present

## 2023-10-09 ENCOUNTER — Ambulatory Visit: Payer: Self-pay | Admitting: Physician Assistant

## 2023-10-11 DIAGNOSIS — H6123 Impacted cerumen, bilateral: Secondary | ICD-10-CM | POA: Diagnosis not present

## 2023-10-11 DIAGNOSIS — H902 Conductive hearing loss, unspecified: Secondary | ICD-10-CM | POA: Diagnosis not present

## 2023-10-22 ENCOUNTER — Other Ambulatory Visit: Payer: Self-pay | Admitting: Physician Assistant

## 2023-10-22 DIAGNOSIS — F419 Anxiety disorder, unspecified: Secondary | ICD-10-CM

## 2023-10-27 ENCOUNTER — Other Ambulatory Visit: Payer: Self-pay | Admitting: Physician Assistant

## 2023-11-29 ENCOUNTER — Ambulatory Visit: Payer: Self-pay | Admitting: Physician Assistant

## 2023-11-29 ENCOUNTER — Encounter: Payer: Self-pay | Admitting: Physician Assistant

## 2023-11-29 VITALS — Temp 97.1°F

## 2023-11-29 DIAGNOSIS — R0981 Nasal congestion: Secondary | ICD-10-CM

## 2023-11-29 MED ORDER — FEXOFENADINE-PSEUDOEPHED ER 60-120 MG PO TB12: 1.0000 | ORAL_TABLET | Freq: Two times a day (BID) | ORAL | 0 refills | Status: AC

## 2023-11-29 MED ORDER — AMOXICILLIN 875 MG PO TABS: 875.0000 mg | ORAL_TABLET | Freq: Two times a day (BID) | ORAL | 0 refills | Status: AC

## 2023-11-29 NOTE — Progress Notes (Signed)
 Pt requested visit with provider with exacerbation of his chronic sinusitis.  Notes received from [facility] which we referred him to and they evaluated and also ordered CT of sinuses.

## 2023-11-29 NOTE — Progress Notes (Signed)
   Subjective: Sinus congestion    Patient ID: Gabriel Butler, male    DOB: 1950/06/11, 73 y.o.   MRN: [id number]  HPI Patient presents with 3 to 5 days of sinus congestion.  States decreased voice volume secondary to sore throat from postnasal drainage.  Patient has a history of chronic sinusitis but no acute findings on CT of the sinuses 2 months ago.   Review of Systems Coronary artery disease, diabetes, GERD, and hide attention.    Objective:   Physical Exam HEENT is remarkable for bilateral maxillary guarding.  Bilateral edematous nasal turbinates.  Post nasal drainage Neck is supple without lymphadenopathy or bruits. Lungs are clear to auscultation. Heart is regular rate and rhythm.        Assessment & Plan: Sinus congestion versus early sinusitis   Patient given prescription for Allegra -D and amoxicillin .  Advised to follow-up if no improvement in 3 to 5 days.

## 2023-11-30 ENCOUNTER — Other Ambulatory Visit: Payer: Self-pay | Admitting: Physician Assistant

## 2023-12-03 ENCOUNTER — Other Ambulatory Visit: Payer: Self-pay

## 2023-12-17 ENCOUNTER — Encounter: Payer: Self-pay | Admitting: Physician Assistant

## 2023-12-17 ENCOUNTER — Ambulatory Visit: Payer: Self-pay | Admitting: Physician Assistant

## 2023-12-17 DIAGNOSIS — J209 Acute bronchitis, unspecified: Secondary | ICD-10-CM

## 2023-12-17 MED ORDER — PSEUDOEPH-BROMPHEN-DM 30-2-10 MG/5ML PO SYRP: 5.0000 mL | ORAL_SOLUTION | Freq: Four times a day (QID) | ORAL | 0 refills | Status: AC | PRN

## 2023-12-17 MED ORDER — AZITHROMYCIN 250 MG PO TABS: ORAL_TABLET | ORAL | 0 refills | Status: AC

## 2023-12-17 NOTE — Progress Notes (Signed)
   Subjective: Cough and chest congestion    Patient ID: Gabriel Butler, male    DOB: 04-Mar-1950, 73 y.o.   MRN: [id number]  HPI Patient complaining of cough and chest congestion for 1 week.  Complaint worsened last night with the going temperature lowered.  Waking this morning with productive cough.  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  Denies fever/chills.  No recent travel or known contact with COVID-19.   Review of Systems Coronary artery disease diabetes and hypertension.    Objective:   Physical Exam HEENT remarkable postnasal drainage. Neck is supple without lymphadenopathy or bruits. Lungs have bilateral rales. Heart is regular rate and rhythm.       Assessment & Plan: Bronchitis   Patient given a prescription for Z-Pak and Bromfed-DM.  Follow-up if condition worsens.

## 2023-12-31 ENCOUNTER — Ambulatory Visit: Payer: Self-pay | Admitting: Physician Assistant

## 2023-12-31 ENCOUNTER — Other Ambulatory Visit: Payer: Self-pay | Admitting: Oncology

## 2023-12-31 DIAGNOSIS — F419 Anxiety disorder, unspecified: Secondary | ICD-10-CM

## 2024-01-23 ENCOUNTER — Other Ambulatory Visit: Payer: Self-pay | Admitting: Physician Assistant

## 2024-01-23 DIAGNOSIS — F419 Anxiety disorder, unspecified: Secondary | ICD-10-CM
